# Patient Record
Sex: Male | Born: 1975 | Race: Black or African American | Hispanic: No | Marital: Married | Smoking: Former smoker
Health system: Southern US, Community
[De-identification: ages and names within clinical notes are randomized; demographics above are authoritative.]

## PROBLEM LIST (undated history)

## (undated) DIAGNOSIS — E119 Type 2 diabetes mellitus without complications: Secondary | ICD-10-CM

## (undated) DIAGNOSIS — E782 Mixed hyperlipidemia: Secondary | ICD-10-CM

## (undated) DIAGNOSIS — R479 Unspecified speech disturbances: Secondary | ICD-10-CM

## (undated) DIAGNOSIS — Z87891 Personal history of nicotine dependence: Secondary | ICD-10-CM

## (undated) DIAGNOSIS — K4020 Bilateral inguinal hernia, without obstruction or gangrene, not specified as recurrent: Secondary | ICD-10-CM

## (undated) DIAGNOSIS — K219 Gastro-esophageal reflux disease without esophagitis: Secondary | ICD-10-CM

## (undated) HISTORY — DX: Type 2 diabetes mellitus without complications: E11.9

## (undated) HISTORY — DX: Unspecified speech disturbances: R47.9

## (undated) HISTORY — PX: TONSILLECTOMY: SUR1361

---

## 2003-04-01 ENCOUNTER — Emergency Department (HOSPITAL_COMMUNITY): Admission: EM | Admit: 2003-04-01 | Discharge: 2003-04-01 | Payer: Self-pay | Admitting: Emergency Medicine

## 2009-03-27 ENCOUNTER — Emergency Department (HOSPITAL_COMMUNITY): Admission: EM | Admit: 2009-03-27 | Discharge: 2009-03-27 | Payer: Self-pay | Admitting: Emergency Medicine

## 2010-08-22 LAB — COMPREHENSIVE METABOLIC PANEL
ALT: 34 U/L (ref 0–53)
AST: 24 U/L (ref 0–37)
Albumin: 4.1 g/dL (ref 3.5–5.2)
Alkaline Phosphatase: 65 U/L (ref 39–117)
BUN: 19 mg/dL (ref 6–23)
CO2: 28 mEq/L (ref 19–32)
Calcium: 9.8 mg/dL (ref 8.4–10.5)
Chloride: 106 mEq/L (ref 96–112)
Creatinine, Ser: 1.4 mg/dL (ref 0.4–1.5)
GFR calc Af Amer: >60 mL/min (ref >60)
GFR calc non Af Amer: 59 mL/min — ABNORMAL LOW (ref >60)
Glucose, Bld: 118 mg/dL — ABNORMAL HIGH (ref 70–99)
Potassium: 3.7 mEq/L (ref 3.5–5.1)
Sodium: 141 mEq/L (ref 135–145)
Total Bilirubin: 0.5 mg/dL (ref 0.3–1.2)
Total Protein: 6.3 g/dL (ref 6.0–8.3)

## 2010-08-22 LAB — CBC
HCT: 43.5 % (ref 39.0–52.0)
Hemoglobin: 14.9 g/dL (ref 13.0–17.0)
MCHC: 34.3 g/dL (ref 30.0–36.0)
MCV: 92.3 fL (ref 78.0–100.0)
Platelets: 150 10*3/uL (ref 150–400)
RBC: 4.72 MIL/uL (ref 4.22–5.81)
RDW: 12.8 % (ref 11.5–15.5)
WBC: 6.5 10*3/uL (ref 4.0–10.5)

## 2010-08-22 LAB — URINE MICROSCOPIC-ADD ON

## 2010-08-22 LAB — LIPASE, BLOOD: Lipase: 46 U/L (ref 11–59)

## 2010-08-22 LAB — URINALYSIS, ROUTINE W REFLEX MICROSCOPIC
Bilirubin Urine: NEGATIVE
Glucose, UA: NEGATIVE mg/dL
Ketones, ur: NEGATIVE mg/dL
Nitrite: NEGATIVE
Protein, ur: NEGATIVE mg/dL
Specific Gravity, Urine: 1.028 (ref 1.005–1.030)
Urobilinogen, UA: 1 mg/dL (ref 0.0–1.0)
pH: 6 (ref 5.0–8.0)

## 2010-08-22 LAB — DIFFERENTIAL
Basophils Absolute: 0 10*3/uL (ref 0.0–0.1)
Basophils Relative: 0 % (ref 0–1)
Eosinophils Absolute: 0.1 10*3/uL (ref 0.0–0.7)
Eosinophils Relative: 2 % (ref 0–5)
Lymphocytes Relative: 21 % (ref 12–46)
Lymphs Abs: 1.4 10*3/uL (ref 0.7–4.0)
Monocytes Absolute: 0.7 10*3/uL (ref 0.1–1.0)
Monocytes Relative: 11 % (ref 3–12)
Neutro Abs: 4.3 10*3/uL (ref 1.7–7.7)
Neutrophils Relative %: 66 % (ref 43–77)

## 2010-09-05 ENCOUNTER — Inpatient Hospital Stay (INDEPENDENT_AMBULATORY_CARE_PROVIDER_SITE_OTHER)
Admission: RE | Admit: 2010-09-05 | Discharge: 2010-09-05 | Disposition: A | Discharge status: Home / Self Care | Payer: Self-pay | Source: Ambulatory Visit | Attending: Family Medicine | Admitting: Family Medicine

## 2010-09-05 DIAGNOSIS — L989 Disorder of the skin and subcutaneous tissue, unspecified: Secondary | ICD-10-CM

## 2010-09-05 DIAGNOSIS — J309 Allergic rhinitis, unspecified: Secondary | ICD-10-CM

## 2012-03-09 ENCOUNTER — Encounter (HOSPITAL_COMMUNITY): Payer: Self-pay | Admitting: *Deleted

## 2012-03-09 ENCOUNTER — Emergency Department (INDEPENDENT_AMBULATORY_CARE_PROVIDER_SITE_OTHER)
Admission: EM | Admit: 2012-03-09 | Discharge: 2012-03-09 | Disposition: A | Discharge status: Home / Self Care | Payer: Self-pay | Source: Home / Self Care | Attending: Family Medicine | Admitting: Family Medicine

## 2012-03-09 MED ORDER — DICLOFENAC POTASSIUM 50 MG PO TABS: 50.0000 mg | ORAL_TABLET | Freq: Three times a day (TID) | ORAL | Status: DC

## 2012-03-09 NOTE — ED Notes (Signed)
Pt reports right shoulder pain for 3 months on and off - no known injury

## 2012-03-09 NOTE — ED Provider Notes (Signed)
History     CSN: [id number]  Arrival date & time 03/09/12  1523   First MD Initiated Contact with Patient 03/09/12 1622      Chief Complaint  Patient presents with  . Shoulder Pain    (Consider location/radiation/quality/duration/timing/severity/associated sxs/prior treatment) Patient is a 36 y.o. male presenting with shoulder pain. The history is provided by the patient.  Shoulder Pain This is a new problem. The current episode started more than 1 week ago (8-12 weeks of pain from yoga exercise and work lifting.). The problem has not changed since onset.   History reviewed. No pertinent past medical history.  History reviewed. No pertinent past surgical history.  Family History  Problem Relation Age of Onset  . Family history unknown: Yes    History  Substance Use Topics  . Smoking status: Not on file  . Smokeless tobacco: Not on file  . Alcohol Use:       Review of Systems  Constitutional: Negative.   Gastrointestinal: Negative.   Genitourinary: Negative.   Musculoskeletal: Positive for myalgias. Negative for joint swelling and gait problem.  Skin: Negative.     Allergies  Review of patient's allergies indicates no known allergies.  Home Medications   Current Outpatient Rx  Name Route Sig Dispense Refill  . DICLOFENAC POTASSIUM 50 MG PO TABS Oral Take 1 tablet (50 mg total) by mouth 3 (three) times daily. 30 tablet 0    BP 136/75  Pulse 70  Temp 98.9 F (37.2 C) (Oral)  Resp 16  SpO2 100%  Physical Exam  Nursing note and vitals reviewed. Constitutional: He is oriented to person, place, and time. He appears well-developed and well-nourished.  Musculoskeletal: He exhibits tenderness.       Left shoulder: He exhibits tenderness. He exhibits normal range of motion, no bony tenderness and no swelling.       Arms: Neurological: He is alert and oriented to person, place, and time.  Skin: Skin is warm and dry.    ED Course  Procedures (including  critical care time)  Labs Reviewed - No data to display No results found.   1. Tendonitis of shoulder, left       MDM          Linna Hoff, MD 03/09/12 1825

## 2013-02-16 ENCOUNTER — Ambulatory Visit: Payer: PRIVATE HEALTH INSURANCE

## 2013-05-07 ENCOUNTER — Ambulatory Visit (INDEPENDENT_AMBULATORY_CARE_PROVIDER_SITE_OTHER): Payer: PRIVATE HEALTH INSURANCE | Admitting: Emergency Medicine

## 2013-05-07 VITALS — BP 120/80 | HR 71 | Temp 97.7°F | Resp 16 | Ht 71.5 in | Wt 170.0 lb

## 2013-05-07 DIAGNOSIS — R259 Unspecified abnormal involuntary movements: Secondary | ICD-10-CM

## 2013-05-07 DIAGNOSIS — R251 Tremor, unspecified: Secondary | ICD-10-CM

## 2013-05-07 LAB — POCT CBC
Granulocyte percent: 66.1 %G (ref 37–80)
HCT, POC: 50.6 % (ref 43.5–53.7)
Hemoglobin: 15.5 g/dL (ref 14.1–18.1)
Lymph, poc: 2 (ref 0.6–3.4)
MCH, POC: 30.9 pg (ref 27–31.2)
MCHC: 30.6 g/dL — AB (ref 31.8–35.4)
MCV: 101 fL — AB (ref 80–97)
MID (cbc): 0.5 (ref 0–0.9)
MPV: 8.9 fL (ref 0–99.8)
POC Granulocyte: 4.8 (ref 2–6.9)
POC LYMPH PERCENT: 27 %L (ref 10–50)
POC MID %: 6.9 %M (ref 0–12)
Platelet Count, POC: 175 10*3/uL (ref 142–424)
RBC: 5.01 M/uL (ref 4.69–6.13)
RDW, POC: 12.8 %
WBC: 7.3 10*3/uL (ref 4.6–10.2)

## 2013-05-07 NOTE — Progress Notes (Signed)
   Subjective:    Patient ID: Brett [name], male    DOB: [birth year], 37 y.o.   MRN: [id number]  HPI  Patient presents today with anxiety. He states he has been nervous and anxious all his life. He works in Systems developer at [employer]. He went to college but he had to quit College because he has a hard time getting transportation. He gets the shakes real bad. It makes it hard for him to eat or drink because he has tremmors real bad. He has never been checkout for this problem.     Review of Systems     Objective:   Physical Exam patient is alert and cooperative. He does have a somewhat slow speech. He is oriented. Pupils are equal and reactive to light. His neck is supple. His chest is clear to both auscultation and percussion. Heart is regular rate without murmurs rubs or gallops. The abdomen is soft liver spleen not enlarged no tenderness. Neurologically cranial nerves II through XII are intact. Motor strength is 5 out of 5 almost groups. There are no focal neurological signs to Results for orders placed in visit on 05/07/13  POCT CBC      Result Value Range   WBC 7.3  4.6 - 10.2 K/uL   Lymph, poc 2.0  0.6 - 3.4   POC LYMPH PERCENT 27.0  10 - 50 %L   MID (cbc) 0.5  0 - 0.9   POC MID % 6.9  0 - 12 %M   POC Granulocyte 4.8  2 - 6.9   Granulocyte percent 66.1  37 - 80 %G   RBC 5.01  4.69 - 6.13 M/uL   Hemoglobin 15.5  14.1 - 18.1 g/dL   HCT, POC 78.4  69.6 - 53.7 %   MCV 101.0 (*) 80 - 97 fL   MCH, POC 30.9  27 - 31.2 pg   MCHC 30.6 (*) 31.8 - 35.4 g/dL   RDW, POC 29.5     Platelet Count, POC 175  142 - 424 K/uL   MPV 8.9  0 - 99.8 fL   Patient has a very fine tremor with intention but it is minimal.      Assessment & Plan:  Routine labs were done today. We'll schedule for CT of the head and neuro referral to evaluate for tremor. This is an intention tremor which is not present at rest .

## 2013-05-07 NOTE — Patient Instructions (Signed)
We are going to schedule you for a CT of the head and an appointment to see the neurologist to see if we can help you with your tremor

## 2013-05-08 LAB — COMPREHENSIVE METABOLIC PANEL
ALT: 16 U/L (ref 0–53)
AST: 15 U/L (ref 0–37)
Albumin: 3.9 g/dL (ref 3.5–5.2)
Alkaline Phosphatase: 43 U/L (ref 39–117)
BUN: 16 mg/dL (ref 6–23)
CO2: 28 mEq/L (ref 19–32)
Calcium: 8.9 mg/dL (ref 8.4–10.5)
Chloride: 106 mEq/L (ref 96–112)
Creat: 1.18 mg/dL (ref 0.50–1.35)
Glucose, Bld: 94 mg/dL (ref 70–99)
Potassium: 4.2 mEq/L (ref 3.5–5.3)
Sodium: 140 mEq/L (ref 135–145)
Total Bilirubin: 0.4 mg/dL (ref 0.3–1.2)
Total Protein: 5.7 g/dL — ABNORMAL LOW (ref 6.0–8.3)

## 2013-05-08 LAB — T4, FREE: Free T4: 1.29 ng/dL (ref 0.80–1.80)

## 2013-05-08 LAB — TSH: TSH: 0.382 u[IU]/mL (ref 0.350–4.500)

## 2013-05-27 ENCOUNTER — Ambulatory Visit: Payer: PRIVATE HEALTH INSURANCE | Admitting: Neurology

## 2013-05-28 ENCOUNTER — Other Ambulatory Visit: Payer: Self-pay

## 2014-09-12 ENCOUNTER — Emergency Department (HOSPITAL_COMMUNITY)
Admission: EM | Admit: 2014-09-12 | Discharge: 2014-09-12 | Disposition: A | Discharge status: Home / Self Care | Payer: Self-pay | Attending: Emergency Medicine | Admitting: Emergency Medicine

## 2014-09-12 ENCOUNTER — Encounter (HOSPITAL_COMMUNITY): Payer: Self-pay | Admitting: Family Medicine

## 2014-09-12 ENCOUNTER — Emergency Department (HOSPITAL_COMMUNITY): Payer: Self-pay

## 2014-09-12 DIAGNOSIS — J159 Unspecified bacterial pneumonia: Secondary | ICD-10-CM | POA: Insufficient documentation

## 2014-09-12 DIAGNOSIS — R059 Cough, unspecified: Secondary | ICD-10-CM

## 2014-09-12 DIAGNOSIS — R05 Cough: Secondary | ICD-10-CM

## 2014-09-12 DIAGNOSIS — J189 Pneumonia, unspecified organism: Secondary | ICD-10-CM

## 2014-09-12 MED ORDER — AZITHROMYCIN 250 MG PO TABS: 250.0000 mg | ORAL_TABLET | Freq: Every day | ORAL | Status: DC

## 2014-09-12 NOTE — ED Provider Notes (Signed)
CSN: [id number]     Arrival date & time 09/12/14  1315 History  This chart was scribed for non-physician practitioner, Sharilyn SitesLisa Carreen Milius, PA-C working with Tilden FossaElizabeth Rees, MD, by Jarvis Morganaylor Ferguson, ED Scribe. This patient was seen in room TR06C/TR06C and the patient's care was started at 2:04 PM.    Chief Complaint  Patient presents with  . Cough    The history is provided by the patient. No language interpreter was used.    HPI Comments: Brett [name] is a 39 y.o. male who presents to the Emergency Department complaining of intermittent, gradually worsening, dry cough for 10 days. He states he is having associated chills, generalized myalgias, and nasal congestion. Pt has been taking Mucinex with no relief. He denies any sick contacts. Pt denies any h/o asthma. He denies any fever, nausea, vomiting, chest pain or SOB.    Past Medical History  Diagnosis Date  . Speech impediment    Past Surgical History  Procedure Laterality Date  . Tonsillectomy     Family History  Problem Relation Age of Onset  . Healthy Mother   . Pneumonia Father   . Pneumonia Sister   . Pneumonia Brother   . Diabetes Maternal Grandmother   . Pneumonia Sister    History  Substance Use Topics  . Smoking status: Never Smoker   . Smokeless tobacco: Not on file  . Alcohol Use: Not on file    Review of Systems  Constitutional: Positive for chills. Negative for fever.  Respiratory: Positive for cough. Negative for shortness of breath.   Cardiovascular: Negative for chest pain.  Gastrointestinal: Negative for nausea and vomiting.  Musculoskeletal: Negative for myalgias.      Allergies  Review of patient's allergies indicates no known allergies.  Home Medications   Prior to Admission medications   Not on File   Triage Vitals: BP 121/70 mmHg  Pulse 79  Temp(Src) 98.8 F (37.1 C) (Oral)  Resp 22  SpO2 94%  Physical Exam  Constitutional: He is oriented to person, place, and time. He appears  well-developed and well-nourished.  HENT:  Head: Normocephalic and atraumatic.  Right Ear: Tympanic membrane and ear canal normal.  Left Ear: Tympanic membrane and ear canal normal.  Nose: Nose normal.  Mouth/Throat: Uvula is midline, oropharynx is clear and moist and mucous membranes are normal.  Eyes: Conjunctivae and EOM are normal. Pupils are equal, round, and reactive to light.  Neck: Normal range of motion.  Cardiovascular: Normal rate, regular rhythm and normal heart sounds.   Pulmonary/Chest: Effort normal. No respiratory distress. He has rhonchi.  Rhonchi and left upper lobe, lungs otherwise clear; no distress  Abdominal: Soft. Bowel sounds are normal.  Musculoskeletal: Normal range of motion.  Neurological: He is alert and oriented to person, place, and time.  Skin: Skin is warm and dry.  Psychiatric: He has a normal mood and affect.  Nursing note and vitals reviewed.   ED Course  Procedures (including critical care time)  DIAGNOSTIC STUDIES: Oxygen Saturation is 94% on RA, adequate by my interpretation.    COORDINATION OF CARE: 2:07 PM- Will order CXR. Pt advised of plan for treatment and pt agrees.     Labs Review Labs Reviewed - No data to display  Imaging Review Dg Chest 2 View  09/12/2014   CLINICAL DATA:  Cough with chest congestion and chest pain 1 week. Headache and dizziness.  EXAM: CHEST  2 VIEW  COMPARISON:  None.  FINDINGS: Lungs are adequately inflated with  patchy airspace process over the left lung likely pneumonia. No evidence of effusion. Cardiomediastinal silhouette is within normal. There is mild curvature of the thoracic spine convex right.  IMPRESSION: Patchy airspace process over the left lung likely a pneumonia.   Electronically Signed   By: Elberta Fortis M.D.   On: 09/12/2014 15:13     EKG Interpretation None      MDM   Final diagnoses:  CAP (community acquired pneumonia)   39 year old male here with cough over the past week. No  relief with Mucinex. Patient is afebrile and nontoxic. He does have some rhonchi in his left upper fields. Chest x-ray was obtained confirming likely left sided pneumonia. Patient started on azithromycin. Will discharge home with supportive care.  Discussed plan with patient, he/she acknowledged understanding and agreed with plan of care.  Return precautions given for new or worsening symptoms.  I personally performed the services described in this documentation, which was scribed in my presence. The recorded information has been reviewed and is accurate.  Garlon Hatchet, PA-C 09/12/14 1537  Tilden Fossa, MD 09/12/14 [phone]

## 2014-09-12 NOTE — ED Notes (Signed)
p here for cough, congestion. Taking mucinex.

## 2014-09-12 NOTE — Discharge Instructions (Signed)
Take the prescribed medication as directed. Return to the ED for new or worsening symptoms-- worsening cough, high fever, chest pain, shortness of breath, etc.

## 2014-09-12 NOTE — ED Notes (Signed)
Called xray and they advised they have one in front of this patient and then they will come and get.

## 2017-05-20 DIAGNOSIS — Z8701 Personal history of pneumonia (recurrent): Secondary | ICD-10-CM

## 2017-05-20 HISTORY — DX: Personal history of pneumonia (recurrent): Z87.01

## 2017-08-11 DIAGNOSIS — S63509A Unspecified sprain of unspecified wrist, initial encounter: Secondary | ICD-10-CM | POA: Insufficient documentation

## 2018-01-20 ENCOUNTER — Encounter: Payer: Self-pay | Admitting: Emergency Medicine

## 2018-01-20 ENCOUNTER — Emergency Department
Admission: EM | Admit: 2018-01-20 | Discharge: 2018-01-20 | Disposition: A | Discharge status: Home / Self Care | Payer: BLUE CROSS/BLUE SHIELD | Attending: Emergency Medicine | Admitting: Emergency Medicine

## 2018-01-20 DIAGNOSIS — A084 Viral intestinal infection, unspecified: Secondary | ICD-10-CM

## 2018-01-20 DIAGNOSIS — B9789 Other viral agents as the cause of diseases classified elsewhere: Secondary | ICD-10-CM | POA: Diagnosis not present

## 2018-01-20 DIAGNOSIS — R479 Unspecified speech disturbances: Secondary | ICD-10-CM | POA: Diagnosis not present

## 2018-01-20 DIAGNOSIS — R112 Nausea with vomiting, unspecified: Secondary | ICD-10-CM | POA: Diagnosis present

## 2018-01-20 DIAGNOSIS — K529 Noninfective gastroenteritis and colitis, unspecified: Secondary | ICD-10-CM | POA: Diagnosis not present

## 2018-01-20 LAB — LIPASE, BLOOD: Lipase: 32 U/L (ref 11–51)

## 2018-01-20 LAB — URINALYSIS, COMPLETE (UACMP) WITH MICROSCOPIC
Bacteria, UA: NONE SEEN
Bilirubin Urine: NEGATIVE
Glucose, UA: NEGATIVE mg/dL
Hgb urine dipstick: NEGATIVE
Ketones, ur: NEGATIVE mg/dL
Leukocytes, UA: NEGATIVE
Nitrite: NEGATIVE
Protein, ur: NEGATIVE mg/dL
Specific Gravity, Urine: 1.023 (ref 1.005–1.030)
pH: 5 (ref 5.0–8.0)

## 2018-01-20 LAB — COMPREHENSIVE METABOLIC PANEL
ALT: 35 U/L (ref 0–44)
AST: 24 U/L (ref 15–41)
Albumin: 4.2 g/dL (ref 3.5–5.0)
Alkaline Phosphatase: 57 U/L (ref 38–126)
Anion gap: 7 (ref 5–15)
BUN: 15 mg/dL (ref 6–20)
CO2: 27 mmol/L (ref 22–32)
Calcium: 8.7 mg/dL — ABNORMAL LOW (ref 8.9–10.3)
Chloride: 107 mmol/L (ref 98–111)
Creatinine, Ser: 1.27 mg/dL — ABNORMAL HIGH (ref 0.61–1.24)
GFR calc Af Amer: >60 mL/min (ref >60)
GFR calc non Af Amer: >60 mL/min (ref >60)
Glucose, Bld: 102 mg/dL — ABNORMAL HIGH (ref 70–99)
Potassium: 3.9 mmol/L (ref 3.5–5.1)
Sodium: 141 mmol/L (ref 135–145)
Total Bilirubin: 1 mg/dL (ref 0.3–1.2)
Total Protein: 6.7 g/dL (ref 6.5–8.1)

## 2018-01-20 LAB — CBC
HCT: 47.2 % (ref 40.0–52.0)
Hemoglobin: 16 g/dL (ref 13.0–18.0)
MCH: 30.9 pg (ref 26.0–34.0)
MCHC: 33.9 g/dL (ref 32.0–36.0)
MCV: 91.2 fL (ref 80.0–100.0)
Platelets: 144 10*3/uL — ABNORMAL LOW (ref 150–440)
RBC: 5.17 MIL/uL (ref 4.40–5.90)
RDW: 13.6 % (ref 11.5–14.5)
WBC: 7.3 10*3/uL (ref 3.8–10.6)

## 2018-01-20 MED ORDER — ONDANSETRON 4 MG PO TBDP
8.0000 mg | ORAL_TABLET | Freq: Once | ORAL | Status: AC
  Administered 2018-01-20: 8 mg via ORAL
  Filled 2018-01-20: qty 2

## 2018-01-20 MED ORDER — ONDANSETRON 4 MG PO TBDP: 4.0000 mg | ORAL_TABLET | Freq: Three times a day (TID) | ORAL | 0 refills | Status: DC | PRN

## 2018-01-20 NOTE — ED Triage Notes (Signed)
Pt reports has a lot of issues with his abdomen and has for awhile. Pt reports last pm started with abdominal pain, headache and body aches. Pt also reports dark green diarrhea. Pt denies vomiting, reports does feel nauseated.

## 2018-01-20 NOTE — ED Provider Notes (Signed)
[facility] Emergency Department Provider Note   ____________________________________________    I have reviewed the triage vital signs and the nursing notes.   HISTORY  Chief Complaint Nausea and vomiting    HPI Brett [name] is a 42 y.o. male who reports that yesterday evening he began developing nausea followed by vomiting as well as some upper abdominal discomfort.  He assumed this was related to drinking "a couple of beers ".  This morning he was feeling better and was on his way to work when he developed nausea again.  His abdominal pain is somewhat better.  Denies diarrhea.  No fevers or chills.  No sick contacts.  No recent travel  Past Medical History:  Diagnosis Date  . Speech impediment     Patient Active Problem List   Diagnosis Date Noted  . Tremor 05/07/2013    Past Surgical History:  Procedure Laterality Date  . TONSILLECTOMY      Prior to Admission medications   Medication Sig Start Date End Date Taking? Authorizing Provider  azithromycin (ZITHROMAX) 250 MG tablet Take 1 tablet (250 mg total) by mouth daily. Take first 2 tablets together, then 1 every day until finished. Patient not taking: Reported on 01/20/2018 09/12/14   Garlon Hatchet, PA-C  ondansetron (ZOFRAN ODT) 4 MG disintegrating tablet Take 1 tablet (4 mg total) by mouth every 8 (eight) hours as needed for nausea or vomiting. 01/20/18   Jene Every, MD     Allergies Patient has no known allergies.  Family History  Problem Relation Age of Onset  . Healthy Mother   . Pneumonia Father   . Pneumonia Sister   . Pneumonia Brother   . Diabetes Maternal Grandmother   . Pneumonia Sister     Social History Social History   Tobacco Use  . Smoking status: Never Smoker  Substance Use Topics  . Alcohol use: Not on file  . Drug use: Not on file    Review of Systems  Constitutional: No fever/chills Eyes: No visual changes.  ENT: No sore throat. Cardiovascular:  Denies chest pain. Respiratory: Denies shortness of breath. Gastrointestinal: As above Genitourinary: Negative for dysuria. Musculoskeletal: Negative for back pain. Skin: Negative for rash. Neurological: Negative for headaches    ____________________________________________   PHYSICAL EXAM:  VITAL SIGNS: ED Triage Vitals  Enc Vitals Group     BP 01/20/18 0905 136/79     Pulse Rate 01/20/18 0905 88     Resp 01/20/18 0905 20     Temp 01/20/18 0907 98.2 F (36.8 C)     Temp Source 01/20/18 0907 Oral     SpO2 01/20/18 0905 95 %     Weight 01/20/18 0906 95.3 kg (210 lb)     Height 01/20/18 0906 1.829 m (6')     Head Circumference --      Peak Flow --      Pain Score 01/20/18 0906 8     Pain Loc --      Pain Edu? --      Excl. in GC? --     Constitutional: Alert and oriented. No acute distress. Pleasant and interactive  Nose: No congestion/rhinnorhea. Mouth/Throat: Mucous membranes are moist.    Cardiovascular: Normal rate, regular rhythm.Good peripheral circulation. Respiratory: Normal respiratory effort.  No retractions. Lungs CTAB. Gastrointestinal: Soft and nontender. No distention.   Genitourinary: deferred Musculoskeletal: No lower extremity tenderness nor edema.  Warm and well perfused Neurologic:  Normal speech and language. No  gross focal neurologic deficits are appreciated.  Skin:  Skin is warm, dry and intact. No rash noted. Psychiatric: Mood and affect are normal. Speech and behavior are normal.  ____________________________________________   LABS (all labs ordered are listed, but only abnormal results are displayed)  Labs Reviewed  COMPREHENSIVE METABOLIC PANEL - Abnormal; Notable for the following components:      Result Value   Glucose, Bld 102 (*)    Creatinine, Ser 1.27 (*)    Calcium 8.7 (*)    All other components within normal limits  CBC - Abnormal; Notable for the following components:   Platelets 144 (*)    All other components within  normal limits  URINALYSIS, COMPLETE (UACMP) WITH MICROSCOPIC - Abnormal; Notable for the following components:   Color, Urine YELLOW (*)    APPearance CLEAR (*)    All other components within normal limits  LIPASE, BLOOD   ____________________________________________  EKG  ED ECG REPORT I, Jene Every, the attending physician, personally viewed and interpreted this ECG.  Date: 01/20/2018  Rhythm: normal sinus rhythm QRS Axis: normal Intervals: Incomplete right bundle branch block ST/T Wave abnormalities: normal Narrative Interpretation: no evidence of acute ischemia  ____________________________________________  RADIOLOGY  None ____________________________________________   PROCEDURES  Procedure(s) performed: No  Procedures   Critical Care performed: No ____________________________________________   INITIAL IMPRESSION / ASSESSMENT AND PLAN / ED COURSE  Pertinent labs & imaging results that were available during my care of the patient were reviewed by me and considered in my medical decision making (see chart for details).  Patient quite well-appearing in no acute distress.  Vital signs are unremarkable.  Abdominal exam is benign.  Treated with p.o. ODT Zofran with significant improvement in nausea.  On reexam abdomen remains soft without pain.  Discussed the patient mildly low platelets and the need for outpatient follow-up.  Otherwise lab work is quite reassuring.  Appropriate for conservative care, Zofran prescription provided    ____________________________________________   FINAL CLINICAL IMPRESSION(S) / ED DIAGNOSES  Final diagnoses:  Viral gastroenteritis        Note:  This document was prepared using Dragon voice recognition software and may include unintentional dictation errors.    Jene Every, MD 01/20/18 [phone]

## 2018-01-21 ENCOUNTER — Other Ambulatory Visit: Payer: Self-pay

## 2018-01-21 ENCOUNTER — Ambulatory Visit
Admission: EM | Admit: 2018-01-21 | Discharge: 2018-01-21 | Disposition: A | Discharge status: Home / Self Care | Payer: BLUE CROSS/BLUE SHIELD | Attending: Family Medicine | Admitting: Family Medicine

## 2018-01-21 ENCOUNTER — Encounter: Payer: Self-pay | Admitting: Emergency Medicine

## 2018-01-21 DIAGNOSIS — A084 Viral intestinal infection, unspecified: Secondary | ICD-10-CM | POA: Diagnosis not present

## 2018-01-21 DIAGNOSIS — R51 Headache: Secondary | ICD-10-CM | POA: Diagnosis not present

## 2018-01-21 MED ORDER — KETOROLAC TROMETHAMINE 60 MG/2ML IM SOLN
60.0000 mg | Freq: Once | INTRAMUSCULAR | Status: AC
Start: 2018-01-21 — End: 2018-01-21
  Administered 2018-01-21: 60 mg via INTRAMUSCULAR

## 2018-01-21 MED ORDER — DIPHENOXYLATE-ATROPINE 2.5-0.025 MG PO TABS: 1.0000 | ORAL_TABLET | Freq: Four times a day (QID) | ORAL | 0 refills | Status: DC | PRN

## 2018-01-21 NOTE — ED Triage Notes (Signed)
Patient was seen at [facility] yesterday for abd pain, diarrhea and headache. Patient states he came here because his symptoms have not improved. Patient has been taking the Zofran that was prescribed for his nausea.

## 2018-01-21 NOTE — Discharge Instructions (Addendum)
This appears to be viral.  Lots of fluids. Gatorade, powerade or pedialyte.   Use the lomotil for diarrhea if it is severe.  No need/indication for CT scan at this time.  Take care  Dr. Adriana Simas

## 2018-01-21 NOTE — ED Provider Notes (Signed)
[facility]    CSN: [id number] Arrival date & time: 01/21/18  0820   History   Chief Complaint Headache, diarrhea  HPI  42 year old male presents with the above complaints.  Patient states that he has had ongoing nausea, diarrhea, and headache.  Started on Tuesday.  Patient was seen in the ER yesterday.  Per the note, he was reporting nausea, vomiting, and some upper abdominal pain.  Patient states that he has had no vomiting.  He states that his biggest complaints currently are headache and diarrhea.  He states that he has some intermittent periumbilical abdominal pain.  None currently.  Headache is his primary concern at this point in time.  Moderate in severity.  Patient states that he has been able to tolerate water.  However, foods give him diarrhea.  No fever.  No known inciting factor.  No known exacerbating factors.  No other associated symptoms.  No other complaints.  PMH, Surgical Hx, Family Hx, Social History reviewed and updated as below.  Past Medical History:  Diagnosis Date  . Speech impediment    Patient Active Problem List   Diagnosis Date Noted  . Tremor 05/07/2013   Past Surgical History:  Procedure Laterality Date  . TONSILLECTOMY     Family History Family History  Problem Relation Age of Onset  . Healthy Mother   . Pneumonia Father   . Pneumonia Sister   . Pneumonia Brother   . Diabetes Maternal Grandmother   . Pneumonia Sister     Social History Social History   Tobacco Use  . Smoking status: Never Smoker  . Smokeless tobacco: Never Used  Substance Use Topics  . Alcohol use: Yes    Comment: occasionally   . Drug use: Never     Allergies   Patient has no known allergies.   Review of Systems Review of Systems  Constitutional: Negative for fever.  Gastrointestinal: Positive for abdominal pain, diarrhea and nausea.   Physical Exam Triage Vital Signs ED Triage Vitals [01/21/18 0844]  Enc Vitals Group     BP 121/89   Pulse Rate 77     Resp 18     Temp 98.3 F (36.8 C)     Temp Source Oral     SpO2 97 %     Weight 211 lb (95.7 kg)     Height 6' (1.829 m)     Head Circumference      Peak Flow      Pain Score 7     Pain Loc      Pain Edu?      Excl. in GC?    Updated Vital Signs BP 121/89 (BP Location: Left Arm)   Pulse 77   Temp 98.3 F (36.8 C) (Oral)   Resp 18   Ht 6' (1.829 m)   Wt 95.7 kg   SpO2 97%   BMI 28.62 kg/m   Visual Acuity Right Eye Distance:   Left Eye Distance:   Bilateral Distance:    Right Eye Near:   Left Eye Near:    Bilateral Near:     Physical Exam  Constitutional: He is oriented to person, place, and time. He appears well-developed. No distress.  HENT:  Head: Normocephalic and atraumatic.  Mouth/Throat: Oropharynx is clear and moist.  Cardiovascular: Normal rate and regular rhythm.  Pulmonary/Chest: Effort normal and breath sounds normal. He has no wheezes. He has no rales.  Neurological: He is alert and oriented to person, place, and time.  Answers questions appropriately.  Psychiatric: He has a normal mood and affect. His behavior is normal.  Nursing note and vitals reviewed.  UC Treatments / Results  Labs (all labs ordered are listed, but only abnormal results are displayed) Labs Reviewed - No data to display  EKG None  Radiology No results found.  Procedures Procedures (including critical care time)  Medications Ordered in UC Medications  ketorolac (TORADOL) injection 60 mg (60 mg Intramuscular Given 01/21/18 0904)    Initial Impression / Assessment and Plan / UC Course  I have reviewed the triage vital signs and the nursing notes.  Pertinent labs & imaging results that were available during my care of the patient were reviewed by me and considered in my medical decision making (see chart for details).    42 year old male presents with a viral process.  Toradol given for headache.  Lomotil for diarrhea.  Advised hydration with  Gatorade/Powerade/Pedialyte.  Supportive care.  Final Clinical Impressions(s) / UC Diagnoses   Final diagnoses:  Viral gastroenteritis     Discharge Instructions     This appears to be viral.  Lots of fluids. Gatorade, powerade or pedialyte.   Use the lomotil for diarrhea if it is severe.  No need/indication for CT scan at this time.  Take care  Dr. Adriana Simas      ED Prescriptions    Medication Sig Dispense Auth. Provider   diphenoxylate-atropine (LOMOTIL) 2.5-0.025 MG tablet Take 1 tablet by mouth 4 (four) times daily as needed for diarrhea or loose stools. 30 tablet Tommie Sams, DO     Controlled Substance Prescriptions  Controlled Substance Registry consulted? Not Applicable   Tommie Sams, [place] 01/21/18 1610

## 2018-01-22 ENCOUNTER — Other Ambulatory Visit: Payer: Self-pay

## 2018-01-22 ENCOUNTER — Emergency Department: Payer: BLUE CROSS/BLUE SHIELD

## 2018-01-22 ENCOUNTER — Emergency Department
Admission: EM | Admit: 2018-01-22 | Discharge: 2018-01-22 | Disposition: A | Discharge status: Home / Self Care | Payer: BLUE CROSS/BLUE SHIELD | Attending: Emergency Medicine | Admitting: Emergency Medicine

## 2018-01-22 ENCOUNTER — Encounter: Payer: Self-pay | Admitting: *Deleted

## 2018-01-22 DIAGNOSIS — R52 Pain, unspecified: Secondary | ICD-10-CM

## 2018-01-22 DIAGNOSIS — R079 Chest pain, unspecified: Secondary | ICD-10-CM | POA: Insufficient documentation

## 2018-01-22 DIAGNOSIS — K2100 Gastro-esophageal reflux disease with esophagitis, without bleeding: Secondary | ICD-10-CM

## 2018-01-22 LAB — CBC
HCT: 44.5 % (ref 40.0–52.0)
Hemoglobin: 14.9 g/dL (ref 13.0–18.0)
MCH: 30.6 pg (ref 26.0–34.0)
MCHC: 33.6 g/dL (ref 32.0–36.0)
MCV: 91.2 fL (ref 80.0–100.0)
PLATELETS: 151 10*3/uL (ref 150–440)
RBC: 4.88 MIL/uL (ref 4.40–5.90)
RDW: 13.4 % (ref 11.5–14.5)
WBC: 4.7 10*3/uL (ref 3.8–10.6)

## 2018-01-22 LAB — BASIC METABOLIC PANEL
ANION GAP: 6 (ref 5–15)
BUN: 16 mg/dL (ref 6–20)
CALCIUM: 8.5 mg/dL — AB (ref 8.9–10.3)
CO2: 26 mmol/L (ref 22–32)
CREATININE: 1.18 mg/dL (ref 0.61–1.24)
Chloride: 107 mmol/L (ref 98–111)
Glucose, Bld: 118 mg/dL — ABNORMAL HIGH (ref 70–99)
Potassium: 3.7 mmol/L (ref 3.5–5.1)
SODIUM: 139 mmol/L (ref 135–145)

## 2018-01-22 LAB — TROPONIN I: Troponin I: <0.03 ng/mL (ref <0.03)

## 2018-01-22 LAB — FIBRIN DERIVATIVES D-DIMER (ARMC ONLY): FIBRIN DERIVATIVES D-DIMER (ARMC): 236.13 ng{FEU}/mL (ref 0.00–499.00)

## 2018-01-22 MED ORDER — ESOMEPRAZOLE MAGNESIUM 40 MG PO PACK: 40.0000 mg | PACK | Freq: Every day | ORAL | 0 refills | Status: DC

## 2018-01-22 MED ORDER — GI COCKTAIL ~~LOC~~
30.0000 mL | Freq: Once | ORAL | Status: AC
  Administered 2018-01-22: 30 mL via ORAL
  Filled 2018-01-22: qty 30

## 2018-01-22 NOTE — ED Notes (Signed)
Patient transported to X-ray 

## 2018-01-22 NOTE — ED Triage Notes (Signed)
Pt arrives with c/o chest pain, says he is having "a bad pressure in my chest like my chest is caving in". He says it does feel somewhat like his acid reflux, he has Protonix rx for the same at home.

## 2018-01-22 NOTE — ED Provider Notes (Signed)
[facility] Emergency Department Provider Note   ____________________________________________   First MD Initiated Contact with Patient 01/22/18 [phone]     (approximate)  I have reviewed the triage vital signs and the nursing notes.   HISTORY  Chief Complaint Chest Pain    HPI Brett [name] is a 42 y.o. male who presents to the ED from home with a chief complaint of chest pain.  Patient reports a 1 day history of lower chest/upper epigastric pain.  He was seen in the ED on 9/3 and at urgent care the following day for upper abdominal pain, vomiting and diarrhea thought to be viral gastroenteritis.  Occurred after a night of drinking alcohol.  States symptoms of vomiting and diarrhea have improved.  Complains of constant, nonradiating burning-like sensation in his lower chest/epigastrium.  Symptoms not associated with diaphoresis, shortness of breath, nausea/vomiting, palpitations or dizziness.  Notes Protonix has been breaking his hands out.  Denies difficulty breathing or tongue swelling.  Denies recent fever, chills, dysuria.  Denies recent travel or trauma.   Past Medical History:  Diagnosis Date  . Speech impediment     Patient Active Problem List   Diagnosis Date Noted  . Tremor 05/07/2013    Past Surgical History:  Procedure Laterality Date  . TONSILLECTOMY      Prior to Admission medications   Medication Sig Start Date End Date Taking? Authorizing Provider  diphenoxylate-atropine (LOMOTIL) 2.5-0.025 MG tablet Take 1 tablet by mouth 4 (four) times daily as needed for diarrhea or loose stools. 01/21/18   Tommie Sams, DO  esomeprazole (NEXIUM) 40 MG packet Take 40 mg by mouth daily before breakfast. 01/22/18   Irean Hong, MD  ondansetron (ZOFRAN ODT) 4 MG disintegrating tablet Take 1 tablet (4 mg total) by mouth every 8 (eight) hours as needed for nausea or vomiting. 01/20/18   Jene Every, MD    Allergies Patient has no known  allergies.  Family History  Problem Relation Age of Onset  . Healthy Mother   . Pneumonia Father   . Pneumonia Sister   . Pneumonia Brother   . Diabetes Maternal Grandmother   . Pneumonia Sister     Social History Social History   Tobacco Use  . Smoking status: Never Smoker  . Smokeless tobacco: Never Used  Substance Use Topics  . Alcohol use: Yes    Comment: occasionally   . Drug use: Never    Review of Systems  Constitutional: No fever/chills Eyes: No visual changes. ENT: No sore throat. Cardiovascular: Positive for chest pain. Respiratory: Denies shortness of breath. Gastrointestinal: No abdominal pain.  No nausea, no vomiting.  No diarrhea.  No constipation. Genitourinary: Negative for dysuria. Musculoskeletal: Negative for back pain. Skin: Negative for rash. Neurological: Negative for headaches, focal weakness or numbness.   ____________________________________________   PHYSICAL EXAM:  VITAL SIGNS: ED Triage Vitals  Enc Vitals Group     BP 01/22/18 0139 113/75     Pulse Rate 01/22/18 0139 81     Resp 01/22/18 0139 16     Temp 01/22/18 0139 98.8 F (37.1 C)     Temp Source 01/22/18 0139 Oral     SpO2 01/22/18 0139 96 %     Weight --      Height --      Head Circumference --      Peak Flow --      Pain Score 01/22/18 0146 8     Pain Loc --  Pain Edu? --      Excl. in GC? --     Constitutional: Alert and oriented. Well appearing and in no acute distress. Eyes: Conjunctivae are normal. PERRL. EOMI. Head: Atraumatic. Nose: No congestion/rhinnorhea. Mouth/Throat: Mucous membranes are moist.  Oropharynx non-erythematous. Neck: No stridor.   Cardiovascular: Normal rate, regular rhythm. Grossly normal heart sounds.  Good peripheral circulation. Respiratory: Normal respiratory effort.  No retractions. Lungs CTAB. Gastrointestinal: Soft and nontender to light or deep palpation. No distention. No abdominal bruits. No CVA  tenderness. Musculoskeletal: No lower extremity tenderness nor edema.  No joint effusions. Neurologic:  Normal speech and language. No gross focal neurologic deficits are appreciated. No gait instability. Skin:  Skin is warm, dry and intact. No rash noted. Psychiatric: Mood and affect are normal. Speech and behavior are normal.  ____________________________________________   LABS (all labs ordered are listed, but only abnormal results are displayed)  Labs Reviewed  BASIC METABOLIC PANEL - Abnormal; Notable for the following components:      Result Value   Glucose, Bld 118 (*)    Calcium 8.5 (*)    All other components within normal limits  CBC  TROPONIN I  FIBRIN DERIVATIVES D-DIMER ([facility] ONLY)  TROPONIN I   ____________________________________________  EKG  ED ECG REPORT I, SUNG,JADE J, the attending physician, personally viewed and interpreted this ECG.   Date: 01/22/2018  EKG Time: 0136  Rate: 90  Rhythm: normal EKG, normal sinus rhythm  Axis: Normal  Intervals:left posterior fascicular block  ST&T Change: Nonspecific No change from 01/20/2018 ____________________________________________  RADIOLOGY  ED MD interpretation: Cardiomegaly, otherwise no acute cardiopulmonary process  Official radiology report(s): Dg Chest 2 View  Result Date: 01/22/2018 CLINICAL DATA:  Chest pain with pressure. EXAM: CHEST - 2 VIEW COMPARISON:  None. FINDINGS: Mild cardiomegaly. No aortic aneurysm. Slightly low lung volumes with crowding of interstitial lung markings. No alveolar consolidation or CHF. No effusion or pneumothorax. No acute osseous abnormality. IMPRESSION: Cardiomegaly without active pulmonary disease. Slightly low lung volumes with crowding of interstitial lung markings noted. Electronically Signed   By: Tollie Eth M.D.   On: 01/22/2018 02:16    ____________________________________________   PROCEDURES  Procedure(s) performed: None  Procedures  Critical Care  performed: No  ____________________________________________   INITIAL IMPRESSION / ASSESSMENT AND PLAN / ED COURSE  As part of my medical decision making, I reviewed the following data within the electronic MEDICAL RECORD NUMBER Nursing notes reviewed and incorporated, Labs reviewed, EKG interpreted, Old EKG reviewed, Old chart reviewed, Radiograph reviewed and Notes from prior ED visits   42 year old otherwise healthy male who presents with chest pain in the setting of recent vomiting and diarrhea. Differential diagnosis includes, but is not limited to, ACS, aortic dissection, pulmonary embolism, cardiac tamponade, pneumothorax, pneumonia, pericarditis, myocarditis, GI-related causes including esophagitis/gastritis, and musculoskeletal chest wall pain.    Initial lab work including troponin and d-dimer, EKG and chest x-ray unremarkable.  Will repeat timed troponin.  Administer GI cocktail and reassess.  Clinical Course as of Jan 22 550  Thu Jan 22, 2018  0569 Patient resting no acute distress.  Feels better.  Repeat troponin remains unremarkable.  Will switch him to Nexium from Protonix and he will follow-up closely with his PCP.  Return precautions given.  Patient verbalizes understanding and agrees with plan of care.   [JS]    Clinical Course User Index [JS] Irean Hong, MD     ____________________________________________   FINAL CLINICAL IMPRESSION(S) / ED DIAGNOSES  Final diagnoses:  Pain  Nonspecific chest pain  Gastroesophageal reflux disease with esophagitis     ED Discharge Orders         Ordered    esomeprazole (NEXIUM) 40 MG packet  Daily before breakfast,   Status:  Discontinued     01/22/18 0433    esomeprazole (NEXIUM) 40 MG packet  Daily before breakfast     01/22/18 0434           Note:  This document was prepared using Dragon voice recognition software and may include unintentional dictation errors.    Irean Hong, MD 01/22/18 [phone]

## 2018-01-22 NOTE — Discharge Instructions (Signed)
1.  You may take Nexium 40 mg daily (#30). 2.  Eat bland foods and avoid alcohol. 3.  Return to the ER for worsening symptoms, persistent vomiting, difficulty breathing or other concerns.

## 2018-05-17 ENCOUNTER — Emergency Department
Admission: EM | Admit: 2018-05-17 | Discharge: 2018-05-17 | Disposition: A | Discharge status: Home / Self Care | Payer: BLUE CROSS/BLUE SHIELD | Attending: Emergency Medicine | Admitting: Emergency Medicine

## 2018-05-17 ENCOUNTER — Encounter: Payer: Self-pay | Admitting: Emergency Medicine

## 2018-05-17 DIAGNOSIS — J069 Acute upper respiratory infection, unspecified: Secondary | ICD-10-CM | POA: Diagnosis not present

## 2018-05-17 DIAGNOSIS — B9789 Other viral agents as the cause of diseases classified elsewhere: Secondary | ICD-10-CM | POA: Diagnosis not present

## 2018-05-17 DIAGNOSIS — R05 Cough: Secondary | ICD-10-CM | POA: Diagnosis present

## 2018-05-17 HISTORY — DX: Gastro-esophageal reflux disease without esophagitis: K21.9

## 2018-05-17 LAB — INFLUENZA PANEL BY PCR (TYPE A & B)
INFLAPCR: NEGATIVE
Influenza B By PCR: NEGATIVE

## 2018-05-17 MED ORDER — GUAIFENESIN-CODEINE 100-10 MG/5ML PO SOLN: 5.0000 mL | Freq: Four times a day (QID) | ORAL | 0 refills | Status: DC | PRN

## 2018-05-17 NOTE — ED Triage Notes (Signed)
Patient presents to the ED with fever, cough, congestion and body aches since this am.  Patient states his young son has similar symptoms.  Patient is in no obvious distress at this time.  Ambulatory to triage with steady gait.

## 2018-05-17 NOTE — ED Provider Notes (Signed)
[facility] Emergency Department Provider Note   ____________________________________________   None    (approximate)  I have reviewed the triage vital signs and the nursing notes.   HISTORY  Chief Complaint Cough; Fever; and Generalized Body Aches   HPI Brett [name] is a 42 y.o. male presents to the ED with complaint of fever, cough, congestion and body aches that started this morning.  Patient states that his son has similar symptoms.  Patient denies any vomiting or diarrhea.  He states he did have chills when he arrived home this evening.  Currently rates his discomfort as an 8 out of 10.   Past Medical History:  Diagnosis Date  . Acid reflux   . Speech impediment     Patient Active Problem List   Diagnosis Date Noted  . Tremor 05/07/2013    Past Surgical History:  Procedure Laterality Date  . TONSILLECTOMY      Prior to Admission medications   Medication Sig Start Date End Date Taking? Authorizing Provider  diphenoxylate-atropine (LOMOTIL) 2.5-0.025 MG tablet Take 1 tablet by mouth 4 (four) times daily as needed for diarrhea or loose stools. 01/21/18   Tommie Samsook, Jayce G, DO  esomeprazole (NEXIUM) 40 MG packet Take 40 mg by mouth daily before breakfast. 01/22/18   Irean HongSung, Jade J, MD  guaiFENesin-codeine 100-10 MG/5ML syrup Take 5 mLs by mouth every 6 (six) hours as needed for cough. 05/17/18   Tommi RumpsSummers, Emelina Hinch L, PA-C    Allergies Pantoprazole and Penicillins  Family History  Problem Relation Age of Onset  . Healthy Mother   . Pneumonia Father   . Pneumonia Sister   . Pneumonia Brother   . Diabetes Maternal Grandmother   . Pneumonia Sister     Social History Social History   Tobacco Use  . Smoking status: Never Smoker  . Smokeless tobacco: Never Used  Substance Use Topics  . Alcohol use: Yes    Comment: occasionally   . Drug use: Never    Review of Systems Constitutional: Positive fever/chills Eyes: No visual changes. ENT: No  sore throat.  Positive for nasal congestion. Cardiovascular: Denies chest pain. Respiratory: Denies shortness of breath. Gastrointestinal: No abdominal pain.  No nausea, no vomiting.  Genitourinary: Negative for dysuria. Musculoskeletal: Positive for body aches. Skin: Negative for rash. Neurological: Negative for headaches, focal weakness or numbness. ____________________________________________   PHYSICAL EXAM:  VITAL SIGNS: ED Triage Vitals  Enc Vitals Group     BP 05/17/18 1812 127/67     Pulse Rate 05/17/18 1812 (!) 129     Resp 05/17/18 1812 16     Temp 05/17/18 1812 (!) 100.5 F (38.1 C)     Temp Source 05/17/18 1812 Oral     SpO2 05/17/18 1812 92 %     Weight 05/17/18 1822 207 lb (93.9 kg)     Height 05/17/18 1822 6' (1.829 m)     Head Circumference --      Peak Flow --      Pain Score 05/17/18 1821 8     Pain Loc --      Pain Edu? --      Excl. in GC? --     Constitutional: Alert and oriented. Well appearing and in no acute distress. Eyes: Conjunctivae are normal.  Head: Atraumatic. Nose: Mild congestion/rhinnorhea. Mouth/Throat: Mucous membranes are moist.  Oropharynx non-erythematous. Neck: No stridor.   Cardiovascular: Normal rate, regular rhythm. Grossly normal heart sounds.  Good peripheral circulation. Respiratory: Normal respiratory  effort.  No retractions. Lungs CTAB. Gastrointestinal: Bowel sounds normoactive x4 quadrants.  Nontender to palpation x4 quadrants. Musculoskeletal: No lower extremity tenderness nor edema.   Neurologic:  Normal speech and language. No gross focal neurologic deficits are appreciated. No gait instability. Skin:  Skin is warm, dry and intact. No rash noted. Psychiatric: Mood and affect are normal. Speech and behavior are normal.  ____________________________________________   LABS (all labs ordered are listed, but only abnormal results are displayed)  Labs Reviewed  INFLUENZA PANEL BY PCR (TYPE A & B)     PROCEDURES  Procedure(s) performed: None  Procedures  Critical Care performed: No  ____________________________________________   INITIAL IMPRESSION / ASSESSMENT AND PLAN / ED COURSE  As part of my medical decision making, I reviewed the following data within the electronic MEDICAL RECORD NUMBER Notes from prior ED visits and [place] Controlled Substance Database  Patient presents to the ED with complaint of sudden onset of cough, congestion, body aches and fever.  Patient appears to be uncomfortable but nontoxic.  His influenza test was negative for both AMB.  Patient was given a prescription for guaifenesin with codeine as needed for cough and congestion.  He is encouraged to increase fluids and take Tylenol or ibuprofen if needed for body aches and fever.  He is to follow-up with his PCP if any continued problems.  ____________________________________________   FINAL CLINICAL IMPRESSION(S) / ED DIAGNOSES  Final diagnoses:  Viral URI with cough     ED Discharge Orders         Ordered    guaiFENesin-codeine 100-10 MG/5ML syrup  Every 6 hours PRN     05/17/18 2048           Note:  This document was prepared using Dragon voice recognition software and may include unintentional dictation errors.    Tommi RumpsSummers, Savien Mamula L, PA-C 05/17/18 2150    Jene EveryKinner, Robert, MD 05/17/18 2230

## 2018-05-17 NOTE — ED Notes (Signed)
Topaz pad not working. Pt verbally understood discharge prescriptions and follow ups.

## 2018-05-17 NOTE — Discharge Instructions (Signed)
Follow-up with your primary care provider or can no clinic acute care if any continued problems.  Increase fluids.  Tylenol or ibuprofen as needed for fever, aches or headache.  Also you may take guaifenesin with codeine as needed for cough.  Be aware that this medication contains a narcotic and should not be taken if you are driving or operating machinery.  Your test for the flu is negative.

## 2018-05-19 ENCOUNTER — Emergency Department: Payer: BLUE CROSS/BLUE SHIELD

## 2018-05-19 ENCOUNTER — Encounter: Payer: Self-pay | Admitting: Emergency Medicine

## 2018-05-19 ENCOUNTER — Other Ambulatory Visit: Payer: Self-pay

## 2018-05-19 ENCOUNTER — Inpatient Hospital Stay: Payer: BLUE CROSS/BLUE SHIELD

## 2018-05-19 ENCOUNTER — Inpatient Hospital Stay
Admission: EM | Admit: 2018-05-19 | Discharge: 2018-05-28 | DRG: 193 | Disposition: A | Discharge status: Home / Self Care | Payer: BLUE CROSS/BLUE SHIELD | Attending: Internal Medicine | Admitting: Internal Medicine

## 2018-05-19 DIAGNOSIS — R4789 Other speech disturbances: Secondary | ICD-10-CM | POA: Diagnosis present

## 2018-05-19 DIAGNOSIS — R739 Hyperglycemia, unspecified: Secondary | ICD-10-CM | POA: Diagnosis present

## 2018-05-19 DIAGNOSIS — J181 Lobar pneumonia, unspecified organism: Secondary | ICD-10-CM | POA: Diagnosis present

## 2018-05-19 DIAGNOSIS — J9600 Acute respiratory failure, unspecified whether with hypoxia or hypercapnia: Secondary | ICD-10-CM

## 2018-05-19 DIAGNOSIS — Z888 Allergy status to other drugs, medicaments and biological substances status: Secondary | ICD-10-CM

## 2018-05-19 DIAGNOSIS — R7989 Other specified abnormal findings of blood chemistry: Secondary | ICD-10-CM | POA: Diagnosis present

## 2018-05-19 DIAGNOSIS — J9601 Acute respiratory failure with hypoxia: Secondary | ICD-10-CM | POA: Diagnosis present

## 2018-05-19 DIAGNOSIS — R0603 Acute respiratory distress: Secondary | ICD-10-CM

## 2018-05-19 DIAGNOSIS — R6520 Severe sepsis without septic shock: Secondary | ICD-10-CM

## 2018-05-19 DIAGNOSIS — E871 Hypo-osmolality and hyponatremia: Secondary | ICD-10-CM | POA: Diagnosis present

## 2018-05-19 DIAGNOSIS — J9690 Respiratory failure, unspecified, unspecified whether with hypoxia or hypercapnia: Secondary | ICD-10-CM

## 2018-05-19 DIAGNOSIS — Z880 Allergy status to penicillin: Secondary | ICD-10-CM | POA: Diagnosis not present

## 2018-05-19 DIAGNOSIS — Z79891 Long term (current) use of opiate analgesic: Secondary | ICD-10-CM | POA: Diagnosis not present

## 2018-05-19 DIAGNOSIS — E876 Hypokalemia: Secondary | ICD-10-CM | POA: Diagnosis present

## 2018-05-19 DIAGNOSIS — K219 Gastro-esophageal reflux disease without esophagitis: Secondary | ICD-10-CM | POA: Diagnosis present

## 2018-05-19 DIAGNOSIS — A419 Sepsis, unspecified organism: Secondary | ICD-10-CM

## 2018-05-19 DIAGNOSIS — J90 Pleural effusion, not elsewhere classified: Secondary | ICD-10-CM | POA: Diagnosis present

## 2018-05-19 DIAGNOSIS — J189 Pneumonia, unspecified organism: Secondary | ICD-10-CM | POA: Diagnosis present

## 2018-05-19 DIAGNOSIS — R0602 Shortness of breath: Secondary | ICD-10-CM | POA: Diagnosis not present

## 2018-05-19 DIAGNOSIS — Z8701 Personal history of pneumonia (recurrent): Secondary | ICD-10-CM | POA: Diagnosis present

## 2018-05-19 DIAGNOSIS — Z79899 Other long term (current) drug therapy: Secondary | ICD-10-CM

## 2018-05-19 LAB — COMPREHENSIVE METABOLIC PANEL
ALT: 47 U/L — ABNORMAL HIGH (ref 0–44)
AST: 32 U/L (ref 15–41)
Albumin: 3.9 g/dL (ref 3.5–5.0)
Alkaline Phosphatase: 69 U/L (ref 38–126)
Anion gap: 10 (ref 5–15)
BILIRUBIN TOTAL: 1.5 mg/dL — AB (ref 0.3–1.2)
BUN: 17 mg/dL (ref 6–20)
CO2: 26 mmol/L (ref 22–32)
CREATININE: 1.37 mg/dL — AB (ref 0.61–1.24)
Calcium: 9 mg/dL (ref 8.9–10.3)
Chloride: 101 mmol/L (ref 98–111)
Glucose, Bld: 211 mg/dL — ABNORMAL HIGH (ref 70–99)
POTASSIUM: 4 mmol/L (ref 3.5–5.1)
Sodium: 137 mmol/L (ref 135–145)
TOTAL PROTEIN: 7.3 g/dL (ref 6.5–8.1)

## 2018-05-19 LAB — CBC WITH DIFFERENTIAL/PLATELET
Abs Immature Granulocytes: 0.05 10*3/uL (ref 0.00–0.07)
BASOS PCT: 0 %
Basophils Absolute: 0 10*3/uL (ref 0.0–0.1)
EOS PCT: 0 %
Eosinophils Absolute: 0 10*3/uL (ref 0.0–0.5)
HCT: 47.8 % (ref 39.0–52.0)
Hemoglobin: 15.6 g/dL (ref 13.0–17.0)
Immature Granulocytes: 0 %
Lymphocytes Relative: 4 %
Lymphs Abs: 0.6 10*3/uL — ABNORMAL LOW (ref 0.7–4.0)
MCH: 30.5 pg (ref 26.0–34.0)
MCHC: 32.6 g/dL (ref 30.0–36.0)
MCV: 93.4 fL (ref 80.0–100.0)
MONO ABS: 0.8 10*3/uL (ref 0.1–1.0)
Monocytes Relative: 6 %
NEUTROS ABS: 12 10*3/uL — AB (ref 1.7–7.7)
Neutrophils Relative %: 90 %
PLATELETS: 141 10*3/uL — AB (ref 150–400)
RBC: 5.12 MIL/uL (ref 4.22–5.81)
RDW: 12.5 % (ref 11.5–15.5)
WBC: 13.5 10*3/uL — AB (ref 4.0–10.5)
nRBC: 0 % (ref 0.0–0.2)

## 2018-05-19 LAB — LACTIC ACID, PLASMA
LACTIC ACID, VENOUS: 2.5 mmol/L — AB (ref 0.5–1.9)
Lactic Acid, Venous: 1.3 mmol/L (ref 0.5–1.9)

## 2018-05-19 LAB — BLOOD GAS, ARTERIAL
Acid-Base Excess: 0.1 mmol/L (ref 0.0–2.0)
Allens test (pass/fail): POSITIVE — AB
Bicarbonate: 23.8 mmol/L (ref 20.0–28.0)
FIO2: 1
O2 Saturation: 95.6 %
PH ART: 7.44 (ref 7.350–7.450)
Patient temperature: 37
pCO2 arterial: 35 mmHg (ref 32.0–48.0)
pO2, Arterial: 76 mmHg — ABNORMAL LOW (ref 83.0–108.0)

## 2018-05-19 LAB — TROPONIN I: Troponin I: 0.03 ng/mL (ref <0.03)

## 2018-05-19 LAB — PROCALCITONIN: Procalcitonin: 5.65 ng/mL

## 2018-05-19 LAB — EXPECTORATED SPUTUM ASSESSMENT W GRAM STAIN, RFLX TO RESP C

## 2018-05-19 LAB — STREP PNEUMONIAE URINARY ANTIGEN: Strep Pneumo Urinary Antigen: NEGATIVE

## 2018-05-19 LAB — GLUCOSE, CAPILLARY: Glucose-Capillary: 166 mg/dL — ABNORMAL HIGH (ref 70–99)

## 2018-05-19 LAB — EXPECTORATED SPUTUM ASSESSMENT W REFEX TO RESP CULTURE

## 2018-05-19 LAB — MRSA PCR SCREENING: MRSA by PCR: NEGATIVE

## 2018-05-19 LAB — BRAIN NATRIURETIC PEPTIDE: B Natriuretic Peptide: 83 pg/mL (ref 0.0–100.0)

## 2018-05-19 MED ORDER — MORPHINE SULFATE (PF) 4 MG/ML IV SOLN
INTRAVENOUS | Status: AC
  Filled 2018-05-19: qty 1

## 2018-05-19 MED ORDER — MELOXICAM 7.5 MG PO TABS: 15.0000 mg | ORAL_TABLET | Freq: Every day | ORAL | Status: DC

## 2018-05-19 MED ORDER — ACETAMINOPHEN 325 MG PO TABS
650.0000 mg | ORAL_TABLET | Freq: Four times a day (QID) | ORAL | Status: DC | PRN
  Administered 2018-05-19 – 2018-05-22 (×5): 650 mg via ORAL
  Filled 2018-05-19 (×5): qty 2

## 2018-05-19 MED ORDER — ENOXAPARIN SODIUM 40 MG/0.4ML ~~LOC~~ SOLN
40.0000 mg | SUBCUTANEOUS | Status: DC
  Administered 2018-05-20 – 2018-05-27 (×8): 40 mg via SUBCUTANEOUS
  Filled 2018-05-19 (×8): qty 0.4

## 2018-05-19 MED ORDER — FAMOTIDINE 20 MG PO TABS
20.0000 mg | ORAL_TABLET | Freq: Every day | ORAL | Status: DC
  Administered 2018-05-20 – 2018-05-28 (×9): 20 mg via ORAL
  Filled 2018-05-19 (×3): qty 1
  Filled 2018-05-19: qty 2
  Filled 2018-05-19 (×6): qty 1

## 2018-05-19 MED ORDER — VANCOMYCIN HCL 10 G IV SOLR
1250.0000 mg | Freq: Two times a day (BID) | INTRAVENOUS | Status: DC
  Administered 2018-05-20 (×3): 1250 mg via INTRAVENOUS
  Filled 2018-05-19 (×8): qty 1250

## 2018-05-19 MED ORDER — ONDANSETRON HCL 4 MG PO TABS: 4.0000 mg | ORAL_TABLET | Freq: Four times a day (QID) | ORAL | Status: DC | PRN

## 2018-05-19 MED ORDER — GUAIFENESIN ER 600 MG PO TB12
600.0000 mg | ORAL_TABLET | Freq: Two times a day (BID) | ORAL | Status: DC
  Administered 2018-05-19 – 2018-05-28 (×19): 600 mg via ORAL
  Filled 2018-05-19 (×19): qty 1

## 2018-05-19 MED ORDER — KETOROLAC TROMETHAMINE 30 MG/ML IJ SOLN
15.0000 mg | Freq: Once | INTRAMUSCULAR | Status: AC
  Administered 2018-05-19: 15 mg via INTRAVENOUS
  Filled 2018-05-19: qty 1

## 2018-05-19 MED ORDER — IBUPROFEN 400 MG PO TABS
800.0000 mg | ORAL_TABLET | Freq: Four times a day (QID) | ORAL | Status: DC | PRN
  Administered 2018-05-19 – 2018-05-20 (×2): 800 mg via ORAL
  Filled 2018-05-19 (×2): qty 2

## 2018-05-19 MED ORDER — SODIUM CHLORIDE 0.9 % IV SOLN: INTRAVENOUS | Status: DC

## 2018-05-19 MED ORDER — MORPHINE SULFATE (PF) 2 MG/ML IV SOLN
1.0000 mg | Freq: Four times a day (QID) | INTRAVENOUS | Status: DC | PRN
  Administered 2018-05-19 – 2018-05-21 (×2): 1 mg via INTRAVENOUS
  Filled 2018-05-19: qty 1

## 2018-05-19 MED ORDER — MORPHINE SULFATE (PF) 4 MG/ML IV SOLN
4.0000 mg | Freq: Once | INTRAVENOUS | Status: AC
  Administered 2018-05-19: 4 mg via INTRAVENOUS
  Filled 2018-05-19: qty 1

## 2018-05-19 MED ORDER — MORPHINE SULFATE (PF) 4 MG/ML IV SOLN
4.0000 mg | Freq: Once | INTRAVENOUS | Status: AC
  Administered 2018-05-19: 4 mg via INTRAVENOUS

## 2018-05-19 MED ORDER — SODIUM CHLORIDE 0.9 % IV SOLN
500.0000 mg | INTRAVENOUS | Status: DC
  Administered 2018-05-20 – 2018-05-21 (×2): 500 mg via INTRAVENOUS
  Filled 2018-05-19 (×3): qty 500

## 2018-05-19 MED ORDER — SODIUM CHLORIDE 0.9 % IV SOLN
500.0000 mg | INTRAVENOUS | Status: DC
  Administered 2018-05-19: 500 mg via INTRAVENOUS
  Filled 2018-05-19 (×2): qty 500

## 2018-05-19 MED ORDER — GUAIFENESIN-CODEINE 100-10 MG/5ML PO SOLN
10.0000 mL | Freq: Four times a day (QID) | ORAL | Status: DC | PRN
  Administered 2018-05-19 – 2018-05-28 (×25): 10 mL via ORAL
  Filled 2018-05-19 (×25): qty 10

## 2018-05-19 MED ORDER — SODIUM CHLORIDE 0.9 % IV BOLUS
1000.0000 mL | Freq: Once | INTRAVENOUS | Status: AC
  Administered 2018-05-19: 1000 mL via INTRAVENOUS

## 2018-05-19 MED ORDER — OXYCODONE-ACETAMINOPHEN 5-325 MG PO TABS
1.0000 | ORAL_TABLET | Freq: Four times a day (QID) | ORAL | Status: DC | PRN
  Administered 2018-05-20: 1 via ORAL
  Administered 2018-05-20: 2 via ORAL
  Administered 2018-05-20: 1 via ORAL
  Administered 2018-05-22 – 2018-05-23 (×6): 2 via ORAL
  Administered 2018-05-24: 20:00:00 1 via ORAL
  Administered 2018-05-24 (×2): 2 via ORAL
  Administered 2018-05-25: 1 via ORAL
  Administered 2018-05-25: 2 via ORAL
  Filled 2018-05-19: qty 1
  Filled 2018-05-19 (×2): qty 2
  Filled 2018-05-19: qty 1
  Filled 2018-05-19: qty 2
  Filled 2018-05-19: qty 1
  Filled 2018-05-19 (×7): qty 2
  Filled 2018-05-19: qty 1

## 2018-05-19 MED ORDER — ONDANSETRON HCL 4 MG/2ML IJ SOLN
4.0000 mg | Freq: Once | INTRAMUSCULAR | Status: AC
  Administered 2018-05-19: 4 mg via INTRAVENOUS
  Filled 2018-05-19: qty 2

## 2018-05-19 MED ORDER — IPRATROPIUM-ALBUTEROL 0.5-2.5 (3) MG/3ML IN SOLN
3.0000 mL | Freq: Once | RESPIRATORY_TRACT | Status: AC
  Administered 2018-05-19: 3 mL via RESPIRATORY_TRACT
  Filled 2018-05-19: qty 3

## 2018-05-19 MED ORDER — IPRATROPIUM-ALBUTEROL 0.5-2.5 (3) MG/3ML IN SOLN
3.0000 mL | Freq: Four times a day (QID) | RESPIRATORY_TRACT | Status: DC | PRN
  Administered 2018-05-19: 3 mL via RESPIRATORY_TRACT
  Filled 2018-05-19 (×2): qty 3

## 2018-05-19 MED ORDER — MORPHINE SULFATE (PF) 2 MG/ML IV SOLN
INTRAVENOUS | Status: AC
  Administered 2018-05-19: 1 mg via INTRAVENOUS
  Filled 2018-05-19: qty 1

## 2018-05-19 MED ORDER — SODIUM CHLORIDE 0.9 % IV SOLN
INTRAVENOUS | Status: DC
  Administered 2018-05-19 – 2018-05-20 (×3): via INTRAVENOUS

## 2018-05-19 MED ORDER — VANCOMYCIN HCL IN DEXTROSE 1-5 GM/200ML-% IV SOLN
1000.0000 mg | Freq: Once | INTRAVENOUS | Status: AC
  Administered 2018-05-19: 1000 mg via INTRAVENOUS
  Filled 2018-05-19: qty 200

## 2018-05-19 MED ORDER — ENOXAPARIN SODIUM 40 MG/0.4ML ~~LOC~~ SOLN: 40.0000 mg | SUBCUTANEOUS | Status: DC

## 2018-05-19 MED ORDER — SODIUM CHLORIDE 0.9 % IV SOLN
2.0000 g | INTRAVENOUS | Status: DC
  Administered 2018-05-19: 2 g via INTRAVENOUS
  Filled 2018-05-19 (×2): qty 20

## 2018-05-19 MED ORDER — ONDANSETRON HCL 4 MG/2ML IJ SOLN: 4.0000 mg | Freq: Four times a day (QID) | INTRAMUSCULAR | Status: DC | PRN

## 2018-05-19 MED ORDER — IPRATROPIUM-ALBUTEROL 0.5-2.5 (3) MG/3ML IN SOLN
3.0000 mL | RESPIRATORY_TRACT | Status: DC
  Administered 2018-05-19 – 2018-05-27 (×44): 3 mL via RESPIRATORY_TRACT
  Filled 2018-05-19 (×42): qty 3

## 2018-05-19 MED ORDER — SODIUM CHLORIDE 0.9 % IV SOLN
1.0000 g | Freq: Three times a day (TID) | INTRAVENOUS | Status: DC
  Administered 2018-05-19 – 2018-05-22 (×9): 1 g via INTRAVENOUS
  Filled 2018-05-19 (×11): qty 1

## 2018-05-19 MED ORDER — ACETAMINOPHEN 650 MG RE SUPP: 650.0000 mg | Freq: Four times a day (QID) | RECTAL | Status: DC | PRN

## 2018-05-19 NOTE — Progress Notes (Signed)
CODE SEPSIS - PHARMACY COMMUNICATION  **Broad Spectrum Antibiotics should be administered within 1 hour of Sepsis diagnosis**  Time Code Sepsis Called/Page Received: [id number]  Antibiotics Ordered: ceftriaxone/azithromycin  Time of 1st antibiotic administration: 0900  Additional action taken by pharmacy: NA  If necessary, Name of Provider/Nurse Contacted: NA   Pricilla RiffleAbby K Ellington, PharmD Pharmacy Resident  05/19/2018 9:57 AM

## 2018-05-19 NOTE — H&P (Signed)
[employer] -  at [facility]   PATIENT NAME: Brett [name]    MR#:  [id number]  DATE OF BIRTH:  04-Oct-1975  DATE OF ADMISSION:  05/19/2018  PRIMARY CARE PHYSICIAN: Patient, No Pcp Per   REQUESTING/REFERRING PHYSICIAN: Willy Eddy, MD  CHIEF COMPLAINT:   Chief Complaint  Patient presents with  . Cough  . Shortness of Breath    HISTORY OF PRESENT ILLNESS: Brett [name]  is a 42 y.o. male with a known history of GERD and speech impediment who is presenting to the emergency room complaining of cough and shortness of breath.  He was seen in the emergency room and had the flu test done which was negative he was discharged home diagnosed with viral bronchitis.  Patient continued to have progressive shortness of breath and cough.  He came to the emergency room and noted to have pneumonia.  He states that he had a fever earlier this morning of 100.9.  He also has productive cough of whitish sputum.  He has chest pain with coughing.       PAST MEDICAL HISTORY:   Past Medical History:  Diagnosis Date  . Acid reflux   . Speech impediment     PAST SURGICAL HISTORY:  Past Surgical History:  Procedure Laterality Date  . TONSILLECTOMY      SOCIAL HISTORY:  Social History   Tobacco Use  . Smoking status: Never Smoker  . Smokeless tobacco: Never Used  Substance Use Topics  . Alcohol use: Yes    Comment: occasionally     FAMILY HISTORY:  Family History  Problem Relation Age of Onset  . Healthy Mother   . Pneumonia Father   . Pneumonia Sister   . Pneumonia Brother   . Diabetes Maternal Grandmother   . Pneumonia Sister     DRUG ALLERGIES:  Allergies  Allergen Reactions  . Pantoprazole Hives  . Penicillins Hives    REVIEW OF SYSTEMS:   CONSTITUTIONAL: Positive fever, fatigue or weakness.  EYES: No blurred or double vision.  EARS, NOSE, AND THROAT: No tinnitus or ear pain.  RESPIRATORY: Positive cough, positive shortness of breath,  positive wheezing or no hemoptysis.  CARDIOVASCULAR: No chest pain, orthopnea, edema.  GASTROINTESTINAL: No nausea, vomiting, diarrhea or abdominal pain.  GENITOURINARY: No dysuria, hematuria.  ENDOCRINE: No polyuria, nocturia,  HEMATOLOGY: No anemia, easy bruising or bleeding SKIN: No rash or lesion. MUSCULOSKELETAL: No joint pain or arthritis.   NEUROLOGIC: No tingling, numbness, weakness.  PSYCHIATRY: No anxiety or depression.   MEDICATIONS AT HOME:  Prior to Admission medications   Medication Sig Start Date End Date Taking? Authorizing Provider  esomeprazole (NEXIUM) 40 MG packet Take 40 mg by mouth daily before breakfast. 01/22/18  Yes Irean Hong, MD  diphenoxylate-atropine (LOMOTIL) 2.5-0.025 MG tablet Take 1 tablet by mouth 4 (four) times daily as needed for diarrhea or loose stools. Patient not taking: Reported on 05/19/2018 01/21/18   Tommie Sams, DO  guaiFENesin-codeine 100-10 MG/5ML syrup Take 5 mLs by mouth every 6 (six) hours as needed for cough. Patient not taking: Reported on 05/19/2018 05/17/18   Tommi Rumps, PA-C  ibuprofen (ADVIL,MOTRIN) 800 MG tablet Take 800 mg by mouth every 6 (six) hours as needed.    [provider]  meloxicam (MOBIC) 15 MG tablet Take 15 mg by mouth daily.    [provider]      PHYSICAL EXAMINATION:   VITAL SIGNS: Blood pressure 134/74, pulse (!) 116, temperature 98.7  F (37.1 C), temperature source Oral, resp. rate (!) 30, height 6' (1.829 m), weight 93.9 kg, SpO2 94 %.  GENERAL:  42 y.o.-year-old patient lying in the bed with no acute distress.  EYES: Pupils equal, round, reactive to light and accommodation. No scleral icterus. Extraocular muscles intact.  HEENT: Head atraumatic, normocephalic. Oropharynx and nasopharynx clear.  NECK:  Supple, no jugular venous distention. No thyroid enlargement, no tenderness.  LUNGS: Rhonchus breath sounds bilaterally with accessory muscle usage CARDIOVASCULAR: S1, S2 normal. No  murmurs, rubs, or gallops.  ABDOMEN: Soft, nontender, nondistended. Bowel sounds present. No organomegaly or mass.  EXTREMITIES: No pedal edema, cyanosis, or clubbing.  NEUROLOGIC: Cranial nerves II through XII are intact. Muscle strength 5/5 in all extremities. Sensation intact. Gait not checked.  PSYCHIATRIC: The patient is alert and oriented x 3.  SKIN: No obvious rash, lesion, or ulcer.   LABORATORY PANEL:   CBC Recent Labs  Lab 05/19/18 0819  WBC 13.5*  HGB 15.6  HCT 47.8  PLT 141*  MCV 93.4  MCH 30.5  MCHC 32.6  RDW 12.5  LYMPHSABS 0.6*  MONOABS 0.8  EOSABS 0.0  BASOSABS 0.0   ------------------------------------------------------------------------------------------------------------------  Chemistries  Recent Labs  Lab 05/19/18 0819  NA 137  K 4.0  CL 101  CO2 26  GLUCOSE 211*  BUN 17  CREATININE 1.37*  CALCIUM 9.0  AST 32  ALT 47*  ALKPHOS 69  BILITOT 1.5*   ------------------------------------------------------------------------------------------------------------------ estimated creatinine clearance is 84.4 mL/min (A) (by C-G formula based on SCr of 1.37 mg/dL (H)). ------------------------------------------------------------------------------------------------------------------ No results for input(s): TSH, T4TOTAL, T3FREE, THYROIDAB in the last 72 hours.  Invalid input(s): FREET3   Coagulation profile No results for input(s): INR, PROTIME in the last 168 hours. ------------------------------------------------------------------------------------------------------------------- No results for input(s): DDIMER in the last 72 hours. -------------------------------------------------------------------------------------------------------------------  Cardiac Enzymes Recent Labs  Lab 05/19/18 0819  TROPONINI 0.03*   ------------------------------------------------------------------------------------------------------------------ Invalid input(s):  POCBNP  ---------------------------------------------------------------------------------------------------------------  Urinalysis    Component Value Date/Time   COLORURINE YELLOW (A) 01/20/2018 0914   APPEARANCEUR CLEAR (A) 01/20/2018 0914   LABSPEC 1.023 01/20/2018 0914   PHURINE 5.0 01/20/2018 0914   GLUCOSEU NEGATIVE 01/20/2018 0914   HGBUR NEGATIVE 01/20/2018 0914   BILIRUBINUR NEGATIVE 01/20/2018 0914   KETONESUR NEGATIVE 01/20/2018 0914   PROTEINUR NEGATIVE 01/20/2018 0914   UROBILINOGEN 1.0 03/27/2009 0426   NITRITE NEGATIVE 01/20/2018 0914   LEUKOCYTESUR NEGATIVE 01/20/2018 0914     RADIOLOGY: Dg Chest 2 View  Result Date: 05/19/2018 CLINICAL DATA:  Cough and fever; shortness of breath EXAM: CHEST - 2 VIEW COMPARISON:  January 22, 2018 FINDINGS: There is patchy airspace opacity in the lung bases, slightly more on the left than on the right. Lungs elsewhere are clear. Heart is borderline enlarged with slight pulmonary venous hypertension. No adenopathy. No bone lesions. IMPRESSION: Patchy infiltrate in the bases, felt to represent pneumonia. Pulmonary vascular congestion. Electronically Signed   By: Bretta BangWilliam  Woodruff III M.D.   On: 05/19/2018 08:19    EKG: Orders placed or performed in visit on 05/19/18  . EKG 12-Lead  . EKG 12-Lead  . EKG 12-Lead    IMPRESSION AND PLAN: Patient is a 42-year African-American male presenting with shortness of breath   1.  Community-acquired pneumonia we will treat with IV ceftriaxone azithromycin Flu test has been done 2 days ago which was negative. We will give him antitussives and mucolytic's Use PRN nebulizers as needed  2.  GERD continue PPI   3.  Miscellaneous  Lovenox for DVT prophylaxis       All the records are reviewed and case discussed with ED provider. Management plans discussed with the patient, family and they are in agreement.  CODE STATUS: Advance Directive Documentation     Most Recent Value  Type  of Advance Directive  Healthcare Power of Attorney  Pre-existing out of facility DNR order (yellow form or pink MOST form)  -  "MOST" Form in Place?  -       TOTAL TIME TAKING CARE OF THIS PATIENT: 55 minutes.    Auburn BilberryShreyang Janiqua Friscia M.D on 05/19/2018 at 10:31 AM  Between 7am to 6pm - Pager - [phone]  After 6pm go to [email or web address] - password [email or web address] [facility]  [employer] Office  [phone]  CC: Primary care physician; Patient, No Pcp Per

## 2018-05-19 NOTE — ED Notes (Signed)
First Nurse Note: Patient to ED via [facility] with complaint of chest pain from coughing.  Temp 100 at home.  Sitting in WC, wearing mask.  Seen here on 12/29 and did not get RX for cough syrup filled, taking OTC guaifenesin.

## 2018-05-19 NOTE — Consult Note (Signed)
Reason for Consult: Respiratory distress Referring Physician: Hospitalist  [address] [place] is an 42 y.o. male.  HPI: Mr. Brett [name] is a 42 year old gentleman with a past medical history remarkable for acid reflux and speech impediment presented to the emergency room complaining of cough, increasing shortness of breath, fever and sputum production.  Was seen in emergency department initially had a negative flu test and was subsequently discharged home.  Represented with progressive increasing shortness of breath and admitted to the floor for community-acquired pneumonia.  Initially started on azithromycin and Rocephin.  Has been developing worsening hypoxemic respiratory failure and has been subsequently transferred to the intensive care unit.  He complains of recent sick contacts  Past Medical History:  Diagnosis Date  . Acid reflux   . Speech impediment     Past Surgical History:  Procedure Laterality Date  . TONSILLECTOMY      Family History  Problem Relation Age of Onset  . Healthy Mother   . Pneumonia Father   . Pneumonia Sister   . Pneumonia Brother   . Diabetes Maternal Grandmother   . Pneumonia Sister     Social History:  reports that he has never smoked. He has never used smokeless tobacco. He reports current alcohol use. He reports that he does not use drugs.  Allergies:  Allergies  Allergen Reactions  . Pantoprazole Hives  . Penicillins Hives    Medications: I have reviewed the patient's current medications.  Results for orders placed or performed during the hospital encounter of 05/19/18 (from the past 48 hour(s))  Comprehensive metabolic panel     Status: Abnormal   Collection Time: 05/19/18  8:19 AM  Result Value Ref Range   Sodium 137 135 - 145 mmol/L   Potassium 4.0 3.5 - 5.1 mmol/L   Chloride 101 98 - 111 mmol/L   CO2 26 22 - 32 mmol/L   Glucose, Bld 211 (H) 70 - 99 mg/dL   BUN 17 6 - 20 mg/dL   Creatinine, Ser 3.08 (H) 0.61 - 1.24 mg/dL   Calcium 9.0 8.9  - 65.7 mg/dL   Total Protein 7.3 6.5 - 8.1 g/dL   Albumin 3.9 3.5 - 5.0 g/dL   AST 32 15 - 41 U/L   ALT 47 (H) 0 - 44 U/L   Alkaline Phosphatase 69 38 - 126 U/L   Total Bilirubin 1.5 (H) 0.3 - 1.2 mg/dL   GFR calc non Af Amer >60 >60 mL/min   GFR calc Af Amer >60 >60 mL/min   Anion gap 10 5 - 15    Comment: Performed at [facility], [address] [place], [place] [address]  CBC with Differential     Status: Abnormal   Collection Time: 05/19/18  8:19 AM  Result Value Ref Range   WBC 13.5 (H) 4.0 - 10.5 K/uL   RBC 5.12 4.22 - 5.81 MIL/uL   Hemoglobin 15.6 13.0 - 17.0 g/dL   HCT 29.5 28.4 - 13.2 %   MCV 93.4 80.0 - 100.0 fL   MCH 30.5 26.0 - 34.0 pg   MCHC 32.6 30.0 - 36.0 g/dL   RDW 44.0 10.2 - 72.5 %   Platelets 141 (L) 150 - 400 K/uL   nRBC 0.0 0.0 - 0.2 %   Neutrophils Relative % 90 %   Neutro Abs 12.0 (H) 1.7 - 7.7 K/uL   Lymphocytes Relative 4 %   Lymphs Abs 0.6 (L) 0.7 - 4.0 K/uL   Monocytes Relative 6 %  Monocytes Absolute 0.8 0.1 - 1.0 K/uL   Eosinophils Relative 0 %   Eosinophils Absolute 0.0 0.0 - 0.5 K/uL   Basophils Relative 0 %   Basophils Absolute 0.0 0.0 - 0.1 K/uL   Immature Granulocytes 0 %   Abs Immature Granulocytes 0.05 0.00 - 0.07 K/uL    Comment: Performed at [facility], [address] [place], [place] [address]  Troponin I - ONCE - STAT     Status: Abnormal   Collection Time: 05/19/18  8:19 AM  Result Value Ref Range   Troponin I 0.03 (HH) <0.03 ng/mL    Comment: CRITICAL RESULT CALLED TO, READ BACK BY AND VERIFIED WITH KIM GAULT AT [id number] ON 05/19/18 [facility]. Performed at [facility], [address] [place], [place] [address]   Brain natriuretic peptide     Status: None   Collection Time: 05/19/18  8:19 AM  Result Value Ref Range   B Natriuretic Peptide 83.0 0.0 - 100.0 pg/mL    Comment: Performed at [facility], [address] [place], [place] [address]  Procalcitonin - Baseline     Status: None    Collection Time: 05/19/18  8:19 AM  Result Value Ref Range   Procalcitonin 5.65 ng/mL    Comment:        Interpretation: PCT > 2 ng/mL: Systemic infection (sepsis) is likely, unless other causes are known. (NOTE)       Sepsis PCT Algorithm           Lower Respiratory Tract                                      Infection PCT Algorithm    ----------------------------     ----------------------------         PCT < 0.25 ng/mL                PCT < 0.10 ng/mL         Strongly encourage             Strongly discourage   discontinuation of antibiotics    initiation of antibiotics    ----------------------------     -----------------------------       PCT 0.25 - 0.50 ng/mL            PCT 0.10 - 0.25 ng/mL               OR       >80% decrease in PCT            Discourage initiation of                                            antibiotics      Encourage discontinuation           of antibiotics    ----------------------------     -----------------------------         PCT >= 0.50 ng/mL              PCT 0.26 - 0.50 ng/mL               AND       <80% decrease in PCT              Encourage initiation of  antibiotics       Encourage continuation           of antibiotics    ----------------------------     -----------------------------        PCT >= 0.50 ng/mL                  PCT > 0.50 ng/mL               AND         increase in PCT                  Strongly encourage                                      initiation of antibiotics    Strongly encourage escalation           of antibiotics                                     -----------------------------                                           PCT <= 0.25 ng/mL                                                 OR                                        > 80% decrease in PCT                                     Discontinue / Do not initiate                                              antibiotics Performed at [facility], [address] [place], [place] [address]   Lactic acid, plasma     Status: Abnormal   Collection Time: 05/19/18  8:36 AM  Result Value Ref Range   Lactic Acid, Venous 2.5 (HH) 0.5 - 1.9 mmol/L    Comment: CRITICAL RESULT CALLED TO, READ BACK BY AND VERIFIED WITH KIM GAULT AT 0909 ON 05/19/2018 JJB Performed at [facility], [address] [place], [place] [address]   Lactic acid, plasma     Status: None   Collection Time: 05/19/18 11:11 AM  Result Value Ref Range   Lactic Acid, Venous 1.3 0.5 - 1.9 mmol/L    Comment: Performed at [facility], [address] [place], [place] [address]  Blood gas, arterial     Status: Abnormal   Collection Time: 05/19/18  3:36 PM  Result Value Ref Range   FIO2 1.00    Delivery systems NON-REBREATHER OXYGEN MASK    pH, Arterial 7.44 7.350 -  7.450   pCO2 arterial 35 32.0 - 48.0 mmHg   pO2, Arterial 76 (L) 83.0 - 108.0 mmHg   Bicarbonate 23.8 20.0 - 28.0 mmol/L   Acid-Base Excess 0.1 0.0 - 2.0 mmol/L   O2 Saturation 95.6 %   Patient temperature 37.0    Collection site RIGHT RADIAL    Sample type ARTERIAL DRAW    Allens test (pass/fail) POSITIVE (A) PASS    Comment: Performed at [facility], [address] [place], [place] [address]  Glucose, capillary     Status: Abnormal   Collection Time: 05/19/18  4:05 PM  Result Value Ref Range   Glucose-Capillary 166 (H) 70 - 99 mg/dL    Dg Chest 2 View  Result Date: 05/19/2018 CLINICAL DATA:  Cough and fever; shortness of breath EXAM: CHEST - 2 VIEW COMPARISON:  January 22, 2018 FINDINGS: There is patchy airspace opacity in the lung bases, slightly more on the left than on the right. Lungs elsewhere are clear. Heart is borderline enlarged with slight pulmonary venous hypertension. No adenopathy. No bone lesions. IMPRESSION: Patchy infiltrate in the bases, felt to represent pneumonia. Pulmonary vascular congestion.  Electronically Signed   By: Bretta Bang III M.D.   On: 05/19/2018 08:19    ROS  Pertinent positives please see HPI all else negative Blood pressure 124/68, pulse (!) 110, temperature (!) 102.5 F (39.2 C), temperature source Oral, resp. rate (!) 32, height 6' (1.829 m), weight 93.9 kg, SpO2 91 %. Physical Exam Patient is awake, alert, in respiratory distress on nonrebreather mask.  Is saturating at 95% on the monitor Vital signs: Please see the above listed vital signs HEENT: Trachea midline, no thyromegaly appreciated, no oral lesions appreciated, no jugular venous distention, accessory muscle utilization noted Cardiovascular: Tachycardia sinus mechanism noted on monitor Pulmonary: Crackles appreciated diffusely Abdominal: Positive bowel sounds, soft exam Extremities: No clubbing, cyanosis or edema noted Neurologic: Cranial nerves are grossly intact without any focal deficits appreciated Cutaneous: No rashes or lesions noted  Pertinent labs are remarkable for troponin of 0.03, lactic acid of 2.5, glucose of 211, creatinine 1.37, ALT of 47 total bilirubin of 1.5 white count 13.5, platelet count 141.  Chest x-ray performed reveals right lower lobe consolidation, possible atelectasis and coexisting effusion  Assessment/Plan:  Respiratory distress.  Patient with pneumonia, febrile, white count, sputum production, recent sick contacts with progressive changes on chest x-ray.  Will place on heated high flow, broaden antibiotic coverage to include vancomycin, cefepime and azithromycin.  We will send off urine for Legionella, urine pneumococcus, sputum specimen for Gram stain and CNS.  Will monitor closely in the intensive care unit  Elevated lactic acid at 2.5 secondary to sepsis  Minimal elevation in troponin at 0.03, EKG does not reveal any ischemic changes  Critical care time 30 minutes  Tora Kindred, DO  Tannis Burstein 05/19/2018, 4:13 PM

## 2018-05-19 NOTE — Progress Notes (Signed)
[employer] [facility] [place] [facility]                                                                                                                                                                                  Patient Demographics   Brett [name], is a 42 y.o. male, DOB - 07/07/1975, WUJ:[id number]  Admit date - 05/19/2018   Admitting Physician Auburn BilberryShreyang Alejah Aristizabal, MD  Outpatient Primary MD for the patient is Patient, No Pcp Per   LOS - 0  Subjective: Patient is respiratory status is worsening called by nurse.  Now on full facemask. He states that his breathing is worse.   Review of Systems:   CONSTITUTIONAL: No documented fever. No fatigue, weakness. No weight gain, no weight loss.  EYES: No blurry or double vision.  ENT: No tinnitus. No postnasal drip. No redness of the oropharynx.  RESPIRATORY: No cough, no wheeze, no hemoptysis.  Positive dyspnea.  CARDIOVASCULAR: No chest pain. No orthopnea. No palpitations. No syncope.  GASTROINTESTINAL: No nausea, no vomiting or diarrhea. No abdominal pain. No melena or hematochezia.  GENITOURINARY: No dysuria or hematuria.  ENDOCRINE: No polyuria or nocturia. No heat or cold intolerance.  HEMATOLOGY: No anemia. No bruising. No bleeding.  INTEGUMENTARY: No rashes. No lesions.  MUSCULOSKELETAL: No arthritis. No swelling. No gout.  NEUROLOGIC: No numbness, tingling, or ataxia. No seizure-type activity.  PSYCHIATRIC: No anxiety. No insomnia. No ADD.    Vitals:   Vitals:   05/19/18 1025 05/19/18 1423 05/19/18 1536 05/19/18 1539  BP: 134/74 128/83 124/68   Pulse: (!) 116 (!) 112 (!) 110   Resp: (!) 30 (!) 32    Temp: 98.7 F (37.1 C) 98.8 F (37.1 C) (!) 102.5 F (39.2 C)   TempSrc: Oral Oral Oral   SpO2: 94% 94% (!) 84% 91%  Weight:      Height:        Wt Readings from Last 3 Encounters:  05/19/18 93.9 kg  05/17/18 93.9 kg  01/21/18 95.7 kg     Intake/Output Summary (Last 24 hours) at 05/19/2018 1548 Last data  filed at 05/19/2018 1400 Gross per 24 hour  Intake 2352.49 ml  Output -  Net 2352.49 ml    Physical Exam:   GENERAL: Pleasant-appearing in no apparent distress.  HEAD, EYES, EARS, NOSE AND THROAT: Atraumatic, normocephalic. Extraocular muscles are intact. Pupils equal and reactive to light. Sclerae anicteric. No conjunctival injection. No oro-pharyngeal erythema.  NECK: Supple. There is no jugular venous distention. No bruits, no lymphadenopathy, no thyromegaly.  HEART: Regular rate and rhythm,. No murmurs, no rubs, no clicks.  LUNGS: Positive accessory muscle usage bilateral rhonchus breath sound aBDOMEN: Soft,  flat, nontender, nondistended. Has good bowel sounds. No hepatosplenomegaly appreciated.  EXTREMITIES: No evidence of any cyanosis, clubbing, or peripheral edema.  +2 pedal and radial pulses bilaterally.  NEUROLOGIC: The patient is alert, awake, and oriented x3 with no focal motor or sensory deficits appreciated bilaterally.  SKIN: Moist and warm with no rashes appreciated.  Psych: Not anxious, depressed LN: No inguinal LN enlargement    Antibiotics   Anti-infectives (From admission, onward)   Start     Dose/Rate Route Frequency Ordered Stop   05/19/18 0845  cefTRIAXone (ROCEPHIN) 2 g in sodium chloride 0.9 % 100 mL IVPB     2 g 200 mL/hr over 30 Minutes Intravenous Every 24 hours 05/19/18 0830     05/19/18 0845  azithromycin (ZITHROMAX) 500 mg in sodium chloride 0.9 % 250 mL IVPB     500 mg 250 mL/hr over 60 Minutes Intravenous Every 24 hours 05/19/18 0830        Medications   Scheduled Meds: . enoxaparin (LOVENOX) injection  40 mg Subcutaneous Q24H  . [START ON 05/20/2018] esomeprazole  40 mg Oral QAC breakfast  . guaiFENesin  600 mg Oral BID  . meloxicam  15 mg Oral Daily  . morphine       Continuous Infusions: . sodium chloride    . sodium chloride 100 mL/hr at 05/19/18 1116  . azithromycin Stopped (05/19/18 1003)  . cefTRIAXone (ROCEPHIN)  IV Stopped  (05/19/18 1025)   PRN Meds:.acetaminophen **OR** acetaminophen, guaiFENesin-codeine, ibuprofen, ipratropium-albuterol, ondansetron **OR** ondansetron (ZOFRAN) IV   Data Review:   Micro Results No results found for this or any previous visit (from the past 240 hour(s)).  Radiology Reports Dg Chest 2 View  Result Date: 05/19/2018 CLINICAL DATA:  Cough and fever; shortness of breath EXAM: CHEST - 2 VIEW COMPARISON:  January 22, 2018 FINDINGS: There is patchy airspace opacity in the lung bases, slightly more on the left than on the right. Lungs elsewhere are clear. Heart is borderline enlarged with slight pulmonary venous hypertension. No adenopathy. No bone lesions. IMPRESSION: Patchy infiltrate in the bases, felt to represent pneumonia. Pulmonary vascular congestion. Electronically Signed   By: Bretta BangWilliam  Woodruff III M.D.   On: 05/19/2018 08:19     CBC Recent Labs  Lab 05/19/18 0819  WBC 13.5*  HGB 15.6  HCT 47.8  PLT 141*  MCV 93.4  MCH 30.5  MCHC 32.6  RDW 12.5  LYMPHSABS 0.6*  MONOABS 0.8  EOSABS 0.0  BASOSABS 0.0    Chemistries  Recent Labs  Lab 05/19/18 0819  NA 137  K 4.0  CL 101  CO2 26  GLUCOSE 211*  BUN 17  CREATININE 1.37*  CALCIUM 9.0  AST 32  ALT 47*  ALKPHOS 69  BILITOT 1.5*   ------------------------------------------------------------------------------------------------------------------ estimated creatinine clearance is 84.4 mL/min (A) (by C-G formula based on SCr of 1.37 mg/dL (H)). ------------------------------------------------------------------------------------------------------------------ No results for input(s): HGBA1C in the last 72 hours. ------------------------------------------------------------------------------------------------------------------ No results for input(s): CHOL, HDL, LDLCALC, TRIG, CHOLHDL, LDLDIRECT in the last 72  hours. ------------------------------------------------------------------------------------------------------------------ No results for input(s): TSH, T4TOTAL, T3FREE, THYROIDAB in the last 72 hours.  Invalid input(s): FREET3 ------------------------------------------------------------------------------------------------------------------ No results for input(s): VITAMINB12, FOLATE, FERRITIN, TIBC, IRON, RETICCTPCT in the last 72 hours.  Coagulation profile No results for input(s): INR, PROTIME in the last 168 hours.  No results for input(s): DDIMER in the last 72 hours.  Cardiac Enzymes Recent Labs  Lab 05/19/18 0819  TROPONINI 0.03*   ------------------------------------------------------------------------------------------------------------------ Invalid input(s): POCBNP  Assessment & Plan   Patient is a 42 year old with history of GERD admitted earlier with pneumonia now having worsening respiratory failure  1.  Acute respiratory failure due to pneumonia I will obtain another stat chest x-ray to rule out any progression or any other abnormality such as fluid overload I will obtain ABG Continue antibiotics with ceftriaxone azithromycin I have discussed the case with the ICU attending patient will be transferred to the ICU may need intubation      Code Status Orders  (From admission, onward)         Start     Ordered   05/19/18 1526  Full code  Continuous     05/19/18 1525        Code Status History    This patient has a current code status but no historical code status.    Advance Directive Documentation     Most Recent Value  Type of Advance Directive  Healthcare Power of Attorney  Pre-existing out of facility DNR order (yellow form or pink MOST form)  -  "MOST" Form in Place?  -           Consults  Critical care  DVT Prophylaxis  Lovenox   Lab Results  Component Value Date   PLT 141 (L) 05/19/2018     Time Spent in minutes    critical care Greater than 50% of time spent in care coordination and counseling patient regarding the condition and plan of care.   Auburn Bilberry M.D on 05/19/2018 at 3:48 PM  Between 7am to 6pm - Pager - [phone]  After 6pm go to [email or web address] - Social research officer, government  [employer]   Office  [phone]

## 2018-05-19 NOTE — ED Notes (Signed)
Patient to Rm 8 via WC, Kim RN made aware of room placement, need for bloodwork and Oxygen sat.

## 2018-05-19 NOTE — ED Notes (Signed)
Report to Candy, prepared for transfer to floor.

## 2018-05-19 NOTE — Progress Notes (Addendum)
Patient received from ED around 1520 in respiratory distress. Patient was tachypneic at 34, febrile, sats were in the 80's on 6L via [place]. Patient reported feeling short of breath, was using accessory muscles and gasping when trying to talk. Lung sounds diminished with poor air movement. Patient placed on NRB. Sats improved to 90%. Contacted respiratory for eval. Dr Allena KatzPatel notified and he ordered an ABG and states he is en route to see patient. Dr patel arrived at bedside and ordered for patient to be transferred to ICU. ABG drawn. Contacted ICU charge nurse stacy to make aware of need for a bed. Bed given. Patient transferred. Wife at bedside and accompanied patient to ICU where she was instructed to wait in waiting room. Report given at bedside to [facility]arah.

## 2018-05-19 NOTE — ED Triage Notes (Signed)
Patient reports cough, congestion and fever since Sunday. Also complaining of body aches. Patient states he was unable to get prescription filled and states has been taking mucinex at home without relief.

## 2018-05-19 NOTE — Progress Notes (Signed)
Pharmacy Antibiotic Note  Brett [name] is a 42 y.o. male admitted on 05/19/2018 with pneumonia.  Patient admitted through the ED and initially transferred to the floor on azithromycin and ceftriaxone. Patients condition further declined and patient transferred to the ICU. Pharmacy has been consulted for vancomycin and cefepime dosing. Patient is to remain on azithromycin 500mg  IV Q24hr.   Plan: Cefepime 1g IV Q8hr.   Vancomycin 1g IV x 1. Will continue vancomycin 1250mg  IV Q12hr for goal trough of 15-20. Will follow renal function daily while on vancomycin and obtain trough as clinically indicated.   HIV antibody, Legionella, and Strep Urine antigen are pending collection.   Height: 6' (182.9 cm) Weight: 207 lb 0.2 oz (93.9 kg) IBW/kg (Calculated) : 77.6  Temp (24hrs), Avg:100.2 F (37.9 C), Min:98.7 F (37.1 C), Max:102.5 F (39.2 C)  Recent Labs  Lab 05/19/18 0819 05/19/18 0836 05/19/18 1111  WBC 13.5*  --   --   CREATININE 1.37*  --   --   LATICACIDVEN  --  2.5* 1.3    Estimated Creatinine Clearance: 84.4 mL/min (A) (by C-G formula based on SCr of 1.37 mg/dL (H)).    Allergies  Allergen Reactions  . Pantoprazole Hives  . Penicillins Hives    Antimicrobials this admission: Ceftriaxone 12/13 x 1  Azithromycin 12/31  >>  Cefepime 12/31 >>  Vancomycin 12/31 >>  Dose adjustments this admission: N/A  Microbiology results: 12/31 BCx: pending  12/31 UCx: pending  12/31 Sputum: pending  12/31 MRSA PCR: pending   Thank you for allowing pharmacy to be a part of this patient's care.  Simpson,Michael L 05/19/2018 5:11 PM

## 2018-05-19 NOTE — ED Notes (Signed)
SAT ddropped to 81% on room air, 02 applied 3l with SAT increase to 97.

## 2018-05-19 NOTE — ED Provider Notes (Signed)
[facility] Emergency Department Provider Note    None    (approximate)  I have reviewed the triage vital signs and the nursing notes.   HISTORY  Chief Complaint Cough and Shortness of Breath    HPI Brett [name] is a 42 y.o. male presents to the ER for chief complaint of shortness of breath and productive cough since this weekend.  Was seen in the ER yesterday and treated for viral upper respiratory infection.  Patient states he is having trouble catching his breath.  Does endorse right-sided abdominal pain secondary to frequent coughing.  Feels weak and lightheaded.  Did take some Tylenol prior to arrival for fever and muscle aches.  No recent antibiotic use.    Past Medical History:  Diagnosis Date  . Acid reflux   . Speech impediment    Family History  Problem Relation Age of Onset  . Healthy Mother   . Pneumonia Father   . Pneumonia Sister   . Pneumonia Brother   . Diabetes Maternal Grandmother   . Pneumonia Sister    Past Surgical History:  Procedure Laterality Date  . TONSILLECTOMY     Patient Active Problem List   Diagnosis Date Noted  . Tremor 05/07/2013      Prior to Admission medications   Medication Sig Start Date End Date Taking? Authorizing Provider  diphenoxylate-atropine (LOMOTIL) 2.5-0.025 MG tablet Take 1 tablet by mouth 4 (four) times daily as needed for diarrhea or loose stools. 01/21/18   Tommie Sams, DO  esomeprazole (NEXIUM) 40 MG packet Take 40 mg by mouth daily before breakfast. 01/22/18   Irean Hong, MD  guaiFENesin-codeine 100-10 MG/5ML syrup Take 5 mLs by mouth every 6 (six) hours as needed for cough. 05/17/18   Tommi Rumps, PA-C    Allergies Pantoprazole and Penicillins    Social History Social History   Tobacco Use  . Smoking status: Never Smoker  . Smokeless tobacco: Never Used  Substance Use Topics  . Alcohol use: Yes    Comment: occasionally   . Drug use: Never    Review of  Systems Patient denies headaches, rhinorrhea, blurry vision, numbness, shortness of breath, chest pain, edema, cough, abdominal pain, nausea, vomiting, diarrhea, dysuria, fevers, rashes or hallucinations unless otherwise stated above in HPI. ____________________________________________   PHYSICAL EXAM:  VITAL SIGNS: Vitals:   05/19/18 0914 05/19/18 0951  BP: 120/71 138/84  Pulse: (!) 106 (!) 113  Resp: (!) 36 (!) 32  Temp:    SpO2: 95% 97%    Constitutional: Alert and oriented. Ill appearing, mild respiratory distress Eyes: Conjunctivae are normal.  Head: Atraumatic. Nose: No congestion/rhinnorhea. Mouth/Throat: Mucous membranes are moist.   Neck: No stridor. Painless ROM.  Cardiovascular: tachycardic rate, regular rhythm. Grossly normal heart sounds.  Good peripheral circulation. Respiratory: tachypnea to the 40's, rhonchorus bs in posterior bases with inspiratory crackles Gastrointestinal: Soft and nontender. No distention. No abdominal bruits. No CVA tenderness. Genitourinary: deferred Musculoskeletal: No lower extremity tenderness nor edema.  No joint effusions. Neurologic:  Normal speech and language. No gross focal neurologic deficits are appreciated. No facial droop Skin:  Skin is warm, dry and intact. No rash noted. Psychiatric: Mood and affect are normal. Speech and behavior are normal.  ____________________________________________   LABS (all labs ordered are listed, but only abnormal results are displayed)  Results for orders placed or performed during the hospital encounter of 05/19/18 (from the past 24 hour(s))  Comprehensive metabolic panel  Status: Abnormal   Collection Time: 05/19/18  8:19 AM  Result Value Ref Range   Sodium 137 135 - 145 mmol/L   Potassium 4.0 3.5 - 5.1 mmol/L   Chloride 101 98 - 111 mmol/L   CO2 26 22 - 32 mmol/L   Glucose, Bld 211 (H) 70 - 99 mg/dL   BUN 17 6 - 20 mg/dL   Creatinine, Ser 9.141.37 (H) 0.61 - 1.24 mg/dL   Calcium 9.0  8.9 - 78.210.3 mg/dL   Total Protein 7.3 6.5 - 8.1 g/dL   Albumin 3.9 3.5 - 5.0 g/dL   AST 32 15 - 41 U/L   ALT 47 (H) 0 - 44 U/L   Alkaline Phosphatase 69 38 - 126 U/L   Total Bilirubin 1.5 (H) 0.3 - 1.2 mg/dL   GFR calc non Af Amer >60 >60 mL/min   GFR calc Af Amer >60 >60 mL/min   Anion gap 10 5 - 15  CBC with Differential     Status: Abnormal   Collection Time: 05/19/18  8:19 AM  Result Value Ref Range   WBC 13.5 (H) 4.0 - 10.5 K/uL   RBC 5.12 4.22 - 5.81 MIL/uL   Hemoglobin 15.6 13.0 - 17.0 g/dL   HCT 95.647.8 21.339.0 - 08.652.0 %   MCV 93.4 80.0 - 100.0 fL   MCH 30.5 26.0 - 34.0 pg   MCHC 32.6 30.0 - 36.0 g/dL   RDW 57.812.5 46.911.5 - 62.915.5 %   Platelets 141 (L) 150 - 400 K/uL   nRBC 0.0 0.0 - 0.2 %   Neutrophils Relative % 90 %   Neutro Abs 12.0 (H) 1.7 - 7.7 K/uL   Lymphocytes Relative 4 %   Lymphs Abs 0.6 (L) 0.7 - 4.0 K/uL   Monocytes Relative 6 %   Monocytes Absolute 0.8 0.1 - 1.0 K/uL   Eosinophils Relative 0 %   Eosinophils Absolute 0.0 0.0 - 0.5 K/uL   Basophils Relative 0 %   Basophils Absolute 0.0 0.0 - 0.1 K/uL   Immature Granulocytes 0 %   Abs Immature Granulocytes 0.05 0.00 - 0.07 K/uL  Troponin I - ONCE - STAT     Status: Abnormal   Collection Time: 05/19/18  8:19 AM  Result Value Ref Range   Troponin I 0.03 (HH) <0.03 ng/mL  Brain natriuretic peptide     Status: None   Collection Time: 05/19/18  8:19 AM  Result Value Ref Range   B Natriuretic Peptide 83.0 0.0 - 100.0 pg/mL  Lactic acid, plasma     Status: Abnormal   Collection Time: 05/19/18  8:36 AM  Result Value Ref Range   Lactic Acid, Venous 2.5 (HH) 0.5 - 1.9 mmol/L   ____________________________________________  EKG My review and personal interpretation at Time: 8:23   Indication: tachycardia  Rate: 110  Rhythm: sinus Axis: normal Other: normal intervals, no stemi ____________________________________________  RADIOLOGY  I personally reviewed all radiographic images ordered to evaluate for the above acute  complaints and reviewed radiology reports and findings.  These findings were personally discussed with the patient.  Please see medical record for radiology report.  ____________________________________________   PROCEDURES  Procedure(s) performed:  .Critical Care Performed by: Willy Eddyobinson, Dorette Hartel, MD Authorized by: Willy Eddyobinson, Jackson Coffield, MD   Critical care provider statement:    Critical care time was exclusive of:  Separately billable procedures and treating other patients   Critical care was necessary to treat or prevent imminent or life-threatening deterioration of the following conditions:  Sepsis  and respiratory failure   Critical care was time spent personally by me on the following activities:  Development of treatment plan with patient or surrogate, discussions with consultants, evaluation of patient's response to treatment, examination of patient, obtaining history from patient or surrogate, ordering and performing treatments and interventions, ordering and review of laboratory studies, ordering and review of radiographic studies, pulse oximetry, re-evaluation of patient's condition and review of old charts      Critical Care performed: yes ____________________________________________   INITIAL IMPRESSION / ASSESSMENT AND PLAN / ED COURSE  Pertinent labs & imaging results that were available during my care of the patient were reviewed by me and considered in my medical decision making (see chart for details).   DDX: sepsis, pna, bronchitis, chf,  Vickki MuffDevon Bishop is a 42 y.o. who presents to the ED with symptoms as described above.  Patient febrile tachycardic with mild respiratory distress requiring supplemental oxygen.  Has rhonchorous breath sounds certainly concerning for pneumonia.  Chest x-ray shows evidence of multilobar pneumonia.  Less likely CHF given fever and recent productive cough.  No history of CHF.  Doubt PE.  Will provide IV fluids.  Will send blood work for above  differential.  Will start on antibiotics for community-acquired pneumonia.  Will give nebulizer.  Clinical Course as of May 19 952  Tue May 19, 2018  [id number] Patient becoming increasingly short of breath.  Desaturated down to 81% on room air just resting.  Based on his sepsis with acute respiratory failure with hypoxia requiring supplemental oxygen likely secondary to multilobar pneumonia I do feel patient will require hospitalization for further medical management.   [PR]    Clinical Course User Index [PR] Willy Eddyobinson, Nakyra Bourn, MD     As part of my medical decision making, I reviewed the following data within the electronic MEDICAL RECORD NUMBER Nursing notes reviewed and incorporated, Labs reviewed, notes from prior ED visits and [place] Controlled Substance Database   ____________________________________________   FINAL CLINICAL IMPRESSION(S) / ED DIAGNOSES  Final diagnoses:  Sepsis with acute hypoxic respiratory failure without septic shock, due to unspecified organism [facility](HCC)      NEW MEDICATIONS STARTED DURING THIS VISIT:  New Prescriptions   No medications on file     Note:  This document was prepared using Dragon voice recognition software and may include unintentional dictation errors.    Willy Eddyobinson, Jamiah Homeyer, MD 05/19/18 [phone]

## 2018-05-19 NOTE — ED Notes (Signed)
ED TO INPATIENT HANDOFF REPORT  Name/Age/Gender Brett [name] 42 y.o. male  Code Status Advance Directive Documentation     Most Recent Value  Type of Advance Directive  Healthcare Power of Attorney  Pre-existing out of facility DNR order (yellow form or pink MOST form)  -  "MOST" Form in Place?  -      Home/SNF/Other Home  Chief Complaint uri  Level of Care/Admitting Diagnosis ED Disposition    ED Disposition Condition Comment   Admit  The patient appears reasonably stabilized for admission considering the current resources, flow, and capabilities available in the ED at this time, and I doubt any other [facility] requiring further screening and/or treatment in the ED prior to admission is  present.       Medical History Past Medical History:  Diagnosis Date  . Acid reflux   . Speech impediment     Allergies Allergies  Allergen Reactions  . Pantoprazole Hives  . Penicillins Hives    IV Location/Drains/Wounds Patient Lines/Drains/Airways Status   Active Line/Drains/Airways    Name:   Placement date:   Placement time:   Site:   Days:   Peripheral IV 05/19/18 Right Antecubital   05/19/18    0847    Antecubital   less than 1   Peripheral IV 05/19/18 Antecubital   05/19/18    0847    Antecubital   less than 1          Labs/Imaging Results for orders placed or performed during the hospital encounter of 05/19/18 (from the past 48 hour(s))  Comprehensive metabolic panel     Status: Abnormal   Collection Time: 05/19/18  8:19 AM  Result Value Ref Range   Sodium 137 135 - 145 mmol/L   Potassium 4.0 3.5 - 5.1 mmol/L   Chloride 101 98 - 111 mmol/L   CO2 26 22 - 32 mmol/L   Glucose, Bld 211 (H) 70 - 99 mg/dL   BUN 17 6 - 20 mg/dL   Creatinine, Ser 9.601.37 (H) 0.61 - 1.24 mg/dL   Calcium 9.0 8.9 - 45.410.3 mg/dL   Total Protein 7.3 6.5 - 8.1 g/dL   Albumin 3.9 3.5 - 5.0 g/dL   AST 32 15 - 41 U/L   ALT 47 (H) 0 - 44 U/L   Alkaline Phosphatase 69 38 - 126 U/L   Total  Bilirubin 1.5 (H) 0.3 - 1.2 mg/dL   GFR calc non Af Amer >60 >60 mL/min   GFR calc Af Amer >60 >60 mL/min   Anion gap 10 5 - 15    Comment: Performed at [facility], [address] [place], [place] [address]  CBC with Differential     Status: Abnormal   Collection Time: 05/19/18  8:19 AM  Result Value Ref Range   WBC 13.5 (H) 4.0 - 10.5 K/uL   RBC 5.12 4.22 - 5.81 MIL/uL   Hemoglobin 15.6 13.0 - 17.0 g/dL   HCT 91.447.8 78.239.0 - 95.652.0 %   MCV 93.4 80.0 - 100.0 fL   MCH 30.5 26.0 - 34.0 pg   MCHC 32.6 30.0 - 36.0 g/dL   RDW 21.312.5 08.611.5 - 57.815.5 %   Platelets 141 (L) 150 - 400 K/uL   nRBC 0.0 0.0 - 0.2 %   Neutrophils Relative % 90 %   Neutro Abs 12.0 (H) 1.7 - 7.7 K/uL   Lymphocytes Relative 4 %   Lymphs Abs 0.6 (L) 0.7 - 4.0 K/uL   Monocytes Relative 6 %  Monocytes Absolute 0.8 0.1 - 1.0 K/uL   Eosinophils Relative 0 %   Eosinophils Absolute 0.0 0.0 - 0.5 K/uL   Basophils Relative 0 %   Basophils Absolute 0.0 0.0 - 0.1 K/uL   Immature Granulocytes 0 %   Abs Immature Granulocytes 0.05 0.00 - 0.07 K/uL    Comment: Performed at [facility], [address] [place], [place] [address]  Troponin I - ONCE - STAT     Status: Abnormal   Collection Time: 05/19/18  8:19 AM  Result Value Ref Range   Troponin I 0.03 (HH) <0.03 ng/mL    Comment: CRITICAL RESULT CALLED TO, READ BACK BY AND VERIFIED WITH KIM Carrel Leather AT 6045 ON 05/19/18 [facility]. Performed at [facility], [address] [place], [place] [address]   Brain natriuretic peptide     Status: None   Collection Time: 05/19/18  8:19 AM  Result Value Ref Range   B Natriuretic Peptide 83.0 0.0 - 100.0 pg/mL    Comment: Performed at [facility], [address] [place], [place] [address]  Lactic acid, plasma     Status: Abnormal   Collection Time: 05/19/18  8:36 AM  Result Value Ref Range   Lactic Acid, Venous 2.5 (HH) 0.5 - 1.9 mmol/L    Comment: CRITICAL RESULT CALLED TO, READ BACK BY AND VERIFIED  WITH KIM Sharnese Heath AT 0909 ON 05/19/2018 [facility] Performed at [facility], [address] [place] [place], [place] [address]    Dg Chest 2 View  Result Date: 05/19/2018 CLINICAL DATA:  Cough and fever; shortness of breath EXAM: CHEST - 2 VIEW COMPARISON:  January 22, 2018 FINDINGS: There is patchy airspace opacity in the lung bases, slightly more on the left than on the right. Lungs elsewhere are clear. Heart is borderline enlarged with slight pulmonary venous hypertension. No adenopathy. No bone lesions. IMPRESSION: Patchy infiltrate in the bases, felt to represent pneumonia. Pulmonary vascular congestion. Electronically Signed   By: Bretta Bang III M.D.   On: 05/19/2018 08:19    Pending Labs Unresulted Labs (From admission, onward)    Start     Ordered   05/19/18 0831  Blood Culture (routine x 2)  BLOOD CULTURE X 2,   STAT     05/19/18 0830   05/19/18 0831  Urine culture  ONCE - STAT,   STAT     05/19/18 0830          Vitals/Pain Today's Vitals   05/19/18 0801 05/19/18 0802 05/19/18 0914 05/19/18 0951  BP:   120/71 138/84  Pulse: (!) 108  (!) 106 (!) 113  Resp: (!) 24  (!) 36 (!) 32  Temp: (!) 100.7 F (38.2 C)     TempSrc: Oral     SpO2: 91%  95% 97%  Weight:  93.9 kg    Height:  6' (1.829 m)    PainSc: 10-Worst pain ever   10-Worst pain ever    Isolation Precautions No active isolations  Medications Medications  cefTRIAXone (ROCEPHIN) 2 g in sodium chloride 0.9 % 100 mL IVPB (2 g Intravenous New Bag/Given 05/19/18 0903)  azithromycin (ZITHROMAX) 500 mg in sodium chloride 0.9 % 250 mL IVPB (500 mg Intravenous New Bag/Given 05/19/18 0900)  ketorolac (TORADOL) 30 MG/ML injection 15 mg (has no administration in time range)  sodium chloride 0.9 % bolus 1,000 mL (1,000 mLs Intravenous New Bag/Given 05/19/18 0854)  morphine 4 MG/ML injection 4 mg (4 mg Intravenous Given 05/19/18 0903)  ondansetron (  ZOFRAN) injection 4 mg (4 mg Intravenous Given 05/19/18 0902)   ipratropium-albuterol (DUONEB) 0.5-2.5 (3) MG/3ML nebulizer solution 3 mL (3 mLs Nebulization Given 05/19/18 0912)    Mobility ambulatory

## 2018-05-20 ENCOUNTER — Inpatient Hospital Stay: Payer: BLUE CROSS/BLUE SHIELD

## 2018-05-20 LAB — BRAIN NATRIURETIC PEPTIDE: B Natriuretic Peptide: 90 pg/mL (ref 0.0–100.0)

## 2018-05-20 LAB — CBC
HCT: 39.8 % (ref 39.0–52.0)
Hemoglobin: 12.8 g/dL — ABNORMAL LOW (ref 13.0–17.0)
MCH: 30 pg (ref 26.0–34.0)
MCHC: 32.2 g/dL (ref 30.0–36.0)
MCV: 93.4 fL (ref 80.0–100.0)
Platelets: 127 10*3/uL — ABNORMAL LOW (ref 150–400)
RBC: 4.26 MIL/uL (ref 4.22–5.81)
RDW: 12.7 % (ref 11.5–15.5)
WBC: 8.8 10*3/uL (ref 4.0–10.5)
nRBC: 0 % (ref 0.0–0.2)

## 2018-05-20 LAB — BASIC METABOLIC PANEL
Anion gap: 7 (ref 5–15)
BUN: 14 mg/dL (ref 6–20)
CO2: 24 mmol/L (ref 22–32)
Calcium: 7.9 mg/dL — ABNORMAL LOW (ref 8.9–10.3)
Chloride: 106 mmol/L (ref 98–111)
Creatinine, Ser: 1.01 mg/dL (ref 0.61–1.24)
GFR calc Af Amer: >60 mL/min (ref >60)
GFR calc non Af Amer: >60 mL/min (ref >60)
Glucose, Bld: 131 mg/dL — ABNORMAL HIGH (ref 70–99)
POTASSIUM: 3.3 mmol/L — AB (ref 3.5–5.1)
SODIUM: 137 mmol/L (ref 135–145)

## 2018-05-20 LAB — HIV ANTIBODY (ROUTINE TESTING W REFLEX): HIV Screen 4th Generation wRfx: NONREACTIVE

## 2018-05-20 MED ORDER — FUROSEMIDE 10 MG/ML IJ SOLN
20.0000 mg | Freq: Once | INTRAMUSCULAR | Status: AC
  Administered 2018-05-20: 20 mg via INTRAVENOUS
  Filled 2018-05-20: qty 2

## 2018-05-20 MED ORDER — IPRATROPIUM-ALBUTEROL 0.5-2.5 (3) MG/3ML IN SOLN
3.0000 mL | RESPIRATORY_TRACT | Status: DC | PRN
  Administered 2018-05-21: 3 mL via RESPIRATORY_TRACT
  Filled 2018-05-20: qty 3

## 2018-05-20 MED ORDER — POTASSIUM CHLORIDE CRYS ER 20 MEQ PO TBCR
40.0000 meq | EXTENDED_RELEASE_TABLET | Freq: Once | ORAL | Status: AC
  Administered 2018-05-20: 40 meq via ORAL
  Filled 2018-05-20: qty 2

## 2018-05-20 NOTE — Progress Notes (Signed)
Increazsed flow to 35lpm to better meet patient demands

## 2018-05-20 NOTE — Progress Notes (Signed)
Follow up - Critical Care Medicine Note  Patient Details:    Brett [name] is an 43 y.o. male.with a past medical history remarkable for acid reflux and speech impediment presented to the emergency room complaining of cough, increasing shortness of breath, fever and sputum production.  Was seen in emergency department initially had a negative flu test and was subsequently discharged home.  Represented with progressive increasing shortness of breath and admitted to the floor for community-acquired pneumonia.  Initially started on azithromycin and Rocephin.  Has been developing worsening hypoxemic respiratory failure and has been subsequently transferred to the intensive care unit.    Lines, Airways, Drains:    Anti-infectives:  Anti-infectives (From admission, onward)   Start     Dose/Rate Route Frequency Ordered Stop   05/20/18 1000  azithromycin (ZITHROMAX) 500 mg in sodium chloride 0.9 % 250 mL IVPB     500 mg 250 mL/hr over 60 Minutes Intravenous Every 24 hours 05/19/18 1728     05/19/18 2300  vancomycin (VANCOCIN) 1,250 mg in sodium chloride 0.9 % 250 mL IVPB     1,250 mg 166.7 mL/hr over 90 Minutes Intravenous Every 12 hours 05/19/18 1706     05/19/18 1630  ceFEPIme (MAXIPIME) 1 g in sodium chloride 0.9 % 100 mL IVPB     1 g 200 mL/hr over 30 Minutes Intravenous Every 8 hours 05/19/18 1623     05/19/18 1630  vancomycin (VANCOCIN) IVPB 1000 mg/200 mL premix     1,000 mg 200 mL/hr over 60 Minutes Intravenous  Once 05/19/18 1623 05/19/18 1836   05/19/18 0845  cefTRIAXone (ROCEPHIN) 2 g in sodium chloride 0.9 % 100 mL IVPB  Status:  Discontinued     2 g 200 mL/hr over 30 Minutes Intravenous Every 24 hours 05/19/18 0830 05/19/18 1608   05/19/18 0845  azithromycin (ZITHROMAX) 500 mg in sodium chloride 0.9 % 250 mL IVPB  Status:  Discontinued     500 mg 250 mL/hr over 60 Minutes Intravenous Every 24 hours 05/19/18 0830 05/19/18 1728      Microbiology: Results for orders placed or  performed during the hospital encounter of 05/19/18  Blood Culture (routine x 2)     Status: None (Preliminary result)   Collection Time: 05/19/18  8:36 AM  Result Value Ref Range Status   Specimen Description BLOOD RIGHT ANTECUBITAL  Final   Special Requests   Final    BOTTLES DRAWN AEROBIC AND ANAEROBIC Blood Culture adequate volume   Culture   Final    NO GROWTH < 24 HOURS Performed at [facility], [address] [place], [place] [address]    Report Status PENDING  Incomplete  Blood Culture (routine x 2)     Status: None (Preliminary result)   Collection Time: 05/19/18  8:39 AM  Result Value Ref Range Status   Specimen Description BLOOD LEFT AC  Final   Special Requests   Final    BOTTLES DRAWN AEROBIC AND ANAEROBIC Blood Culture results may not be optimal due to an inadequate volume of blood received in culture bottles   Culture   Final    NO GROWTH < 24 HOURS Performed at [facility], [address] [place], [place] [address]    Report Status PENDING  Incomplete  MRSA PCR Screening     Status: None   Collection Time: 05/19/18  4:18 PM  Result Value Ref Range Status   MRSA by PCR NEGATIVE NEGATIVE Final    Comment:  The GeneXpert MRSA Assay (FDA approved for NASAL specimens only), is one component of a comprehensive MRSA colonization surveillance program. It is not intended to diagnose MRSA infection nor to guide or monitor treatment for MRSA infections. Performed at [facility], [address] [place], [place] [address]   Culture, sputum-assessment     Status: None   Collection Time: 05/19/18  7:26 PM  Result Value Ref Range Status   Specimen Description SPUTUM  Final   Special Requests NONE  Final   Sputum evaluation   Final    THIS SPECIMEN IS ACCEPTABLE FOR SPUTUM CULTURE Performed at [facility], [address] [place], [place] [address]    Report Status 05/19/2018 FINAL  Final  Culture, respiratory      Status: None (Preliminary result)   Collection Time: 05/19/18  7:26 PM  Result Value Ref Range Status   Specimen Description   Final    SPUTUM Performed at [facility], [address] [place], [place] [address]    Special Requests   Final    NONE Reflexed from [id number] Performed at [facility], [address] [place], [place] [address]    Gram Stain   Final    ABUNDANT WBC PRESENT,BOTH PMN AND MONONUCLEAR RARE GRAM POSITIVE COCCI Performed at [facility], [address] [place], [place] [address]    Culture PENDING  Incomplete   Report Status PENDING  Incomplete    Studies: Dg Chest 2 View  Result Date: 05/19/2018 CLINICAL DATA:  Cough and fever; shortness of breath EXAM: CHEST - 2 VIEW COMPARISON:  January 22, 2018 FINDINGS: There is patchy airspace opacity in the lung bases, slightly more on the left than on the right. Lungs elsewhere are clear. Heart is borderline enlarged with slight pulmonary venous hypertension. No adenopathy. No bone lesions. IMPRESSION: Patchy infiltrate in the bases, felt to represent pneumonia. Pulmonary vascular congestion. Electronically Signed   By: Bretta BangWilliam  Woodruff III M.D.   On: 05/19/2018 08:19   Dg Chest Port 1 View  Result Date: 05/20/2018 CLINICAL DATA:  Respiratory failure EXAM: PORTABLE CHEST 1 VIEW COMPARISON:  05/19/2018 FINDINGS: Moderate cardiomegaly with right-greater-than-left basilar predominant airspace opacity and right pleural effusion. Pulmonary edema suspected. IMPRESSION: Cardiomegaly, right pleural effusion and likely pulmonary edema, suggesting congestive heart failure. Electronically Signed   By: Deatra RobinsonKevin  Herman M.D.   On: 05/20/2018 04:49   Dg Chest Port 1 View  Result Date: 05/19/2018 CLINICAL DATA:  Onset of respiratory failure 1 hour prior to presentation. Patient has been transferred to the ICU. EXAM: PORTABLE CHEST 1 VIEW COMPARISON:  PA and lateral chest x-ray of May 19, 2018 FINDINGS:  There is increased density at the right lung base as compared to the earlier study today. There is patchy increased density lateral to the left heart border which is new as well. The upper lobes are clear. The cardiac silhouette remains enlarged. The central pulmonary vascularity is somewhat more engorged. IMPRESSION: Increased density at the right lung base and in the left mid and lower lung may reflect atelectasis or worsening of pneumonia given the sudden change since this morning. No overt pulmonary edema although there is mild central pulmonary vascular prominence and mild cardiomegaly. Electronically Signed   By: David  SwazilandJordan M.D.   On: 05/19/2018 16:32    Consults:    Subjective:    Overnight Issues: Patient states overnight he has had improvement in his respiratory status.  States his cough is productive. Still  on 85% heated high flow  Objective:  Vital signs for last 24 hours: Temp:  [98.3 F (36.8 C)-102.5 F (39.2 C)] 98.3 F (36.8 C) (01/01 0400) Pulse Rate:  [79-116] 89 (01/01 0600) Resp:  [17-36] 26 (01/01 0600) BP: (97-138)/(53-84) 97/53 (01/01 0600) SpO2:  [84 %-98 %] 91 % (01/01 0600) FiO2 (%):  [85 %-100 %] 85 % (01/01 0331) Weight:  [93.9 kg] 93.9 kg (12/31 0802)  Hemodynamic parameters for last 24 hours:    Intake/Output from previous day: 12/31 0701 - 01/01 0700 In: 3666.9 [P.O.:120; I.V.:1864.5; IV Piggyback:1682.4] Out: 400 [Urine:400]  Intake/Output this shift: No intake/output data recorded.  Vent settings for last 24 hours: FiO2 (%):  [85 %-100 %] 85 %  Physical Exam:  Patient is awake, alert, in respiratory distress on nonrebreather mask.  Is saturating at 95% on the monitor Vital signs:       Please see the above listed vital signs HEENT:           Trachea midline, no thyromegaly appreciated, no oral lesions appreciated, no jugular venous distention, accessory muscle utilization noted Cardiovascular:           Tachycardia sinus mechanism noted on  monitor Pulmonary:      Crackles appreciated diffusely Abdominal:      Positive bowel sounds, soft exam Extremities:     No clubbing, cyanosis or edema noted Neurologic:      Cranial nerves are grossly intact without any focal deficits appreciated Cutaneous:      No rashes or lesions noted  Assessment/Plan:  Respiratory distress.  Patient with pneumonia, febrile, white count, sputum production, recent sick contacts with progressive changes on chest x-ray.  On heated high flow, broaden antibiotic coverage to include vancomycin, cefepime and azithromycin.  Pending urine for Legionella, urine pneumococcus negative, sputum specimen for Gram stain and C and S.  Wean Fi02  Elevated lactic acid at 2.5 secondary to sepsis  Hypokalemia.  Will replace  Minimal elevation in troponin at 0.03, EKG does not reveal any ischemic changes  Tora KindredJohn Kemani Heidel, DO  [facility] 05/20/2018  *Care during the described time interval was provided by me and/or other providers on the critical care team.  I have reviewed this patient's available data, including medical history, events of note, physical examination and test results as part of my evaluation.

## 2018-05-20 NOTE — Progress Notes (Signed)
Decreased fio2 to 85%.

## 2018-05-20 NOTE — Progress Notes (Signed)
[employer] - [place] at [facility]                                                                                                                                                                                  Patient Demographics   Brett [name], is a 43 y.o. male, DOB - 1976-01-07, ZOX:[id number]  Admit date - 05/19/2018   Admitting Physician Auburn Bilberry, MD  Outpatient Primary MD for the patient is Patient, No Pcp Per   LOS - 1  Subjective: Patient this morning was on high flow oxygen. Continues to be short of breath   Review of Systems:   CONSTITUTIONAL: No documented fever. No fatigue, weakness. No weight gain, no weight loss.  EYES: No blurry or double vision.  ENT: No tinnitus. No postnasal drip. No redness of the oropharynx.  RESPIRATORY: Positive cough, positive wheeze, no hemoptysis.  Positive dyspnea.  CARDIOVASCULAR: No chest pain. No orthopnea. No palpitations. No syncope.  GASTROINTESTINAL: No nausea, no vomiting or diarrhea. No abdominal pain. No melena or hematochezia.  GENITOURINARY: No dysuria or hematuria.  ENDOCRINE: No polyuria or nocturia. No heat or cold intolerance.  HEMATOLOGY: No anemia. No bruising. No bleeding.  INTEGUMENTARY: No rashes. No lesions.  MUSCULOSKELETAL: No arthritis. No swelling. No gout.  NEUROLOGIC: No numbness, tingling, or ataxia. No seizure-type activity.  PSYCHIATRIC: No anxiety. No insomnia. No ADD.    Vitals:   Vitals:   05/20/18 1200 05/20/18 1300 05/20/18 1400 05/20/18 1500  BP: (!) 103/51 (!) 105/56 122/79 120/87  Pulse: (!) 113 (!) 105 93 90  Resp: (!) 28 (!) 29 (!) 24 (!) 30  Temp: 98.8 F (37.1 C)     TempSrc: Oral     SpO2: 94% 93% 91% 91%  Weight:      Height:        Wt Readings from Last 3 Encounters:  05/19/18 93.9 kg  05/17/18 93.9 kg  01/21/18 95.7 kg     Intake/Output Summary (Last 24 hours) at 05/20/2018 1622 Last data filed at 05/20/2018 1500 Gross per 24 hour  Intake 2134.52 ml   Output 1975 ml  Net 159.52 ml    Physical Exam:   GENERAL: Pleasant-appearing in no apparent distress.  HEAD, EYES, EARS, NOSE AND THROAT: Atraumatic, normocephalic. Extraocular muscles are intact. Pupils equal and reactive to light. Sclerae anicteric. No conjunctival injection. No oro-pharyngeal erythema.  NECK: Supple. There is no jugular venous distention. No bruits, no lymphadenopathy, no thyromegaly.  HEART: Regular rate and rhythm,. No murmurs, no rubs, no clicks.  LUNGS: Rhonchus breath sounds bilaterally  aBDOMEN: Soft, flat, nontender, nondistended. Has good bowel sounds. No hepatosplenomegaly appreciated.  EXTREMITIES: No evidence  of any cyanosis, clubbing, or peripheral edema.  +2 pedal and radial pulses bilaterally.  NEUROLOGIC: The patient is alert, awake, and oriented x3 with no focal motor or sensory deficits appreciated bilaterally.  SKIN: Moist and warm with no rashes appreciated.  Psych: Not anxious, depressed LN: No inguinal LN enlargement    Antibiotics   Anti-infectives (From admission, onward)   Start     Dose/Rate Route Frequency Ordered Stop   05/20/18 1000  azithromycin (ZITHROMAX) 500 mg in sodium chloride 0.9 % 250 mL IVPB     500 mg 250 mL/hr over 60 Minutes Intravenous Every 24 hours 05/19/18 1728     05/19/18 2300  vancomycin (VANCOCIN) 1,250 mg in sodium chloride 0.9 % 250 mL IVPB     1,250 mg 166.7 mL/hr over 90 Minutes Intravenous Every 12 hours 05/19/18 1706     05/19/18 1630  ceFEPIme (MAXIPIME) 1 g in sodium chloride 0.9 % 100 mL IVPB     1 g 200 mL/hr over 30 Minutes Intravenous Every 8 hours 05/19/18 1623     05/19/18 1630  vancomycin (VANCOCIN) IVPB 1000 mg/200 mL premix     1,000 mg 200 mL/hr over 60 Minutes Intravenous  Once 05/19/18 1623 05/19/18 1836   05/19/18 0845  cefTRIAXone (ROCEPHIN) 2 g in sodium chloride 0.9 % 100 mL IVPB  Status:  Discontinued     2 g 200 mL/hr over 30 Minutes Intravenous Every 24 hours 05/19/18 0830 05/19/18  1608   05/19/18 0845  azithromycin (ZITHROMAX) 500 mg in sodium chloride 0.9 % 250 mL IVPB  Status:  Discontinued     500 mg 250 mL/hr over 60 Minutes Intravenous Every 24 hours 05/19/18 0830 05/19/18 1728      Medications   Scheduled Meds: . enoxaparin (LOVENOX) injection  40 mg Subcutaneous Q24H  . famotidine  20 mg Oral QAC breakfast  . guaiFENesin  600 mg Oral BID  . ipratropium-albuterol  3 mL Nebulization Q4H   Continuous Infusions: . azithromycin Stopped (05/20/18 1154)  . ceFEPime (MAXIPIME) IV Stopped (05/20/18 1450)  . vancomycin Stopped (05/20/18 1416)   PRN Meds:.acetaminophen **OR** acetaminophen, guaiFENesin-codeine, ibuprofen, ipratropium-albuterol, morphine injection, ondansetron **OR** ondansetron (ZOFRAN) IV, oxyCODONE-acetaminophen   Data Review:   Micro Results Recent Results (from the past 240 hour(s))  Blood Culture (routine x 2)     Status: None (Preliminary result)   Collection Time: 05/19/18  8:36 AM  Result Value Ref Range Status   Specimen Description BLOOD RIGHT ANTECUBITAL  Final   Special Requests   Final    BOTTLES DRAWN AEROBIC AND ANAEROBIC Blood Culture adequate volume   Culture   Final    NO GROWTH < 24 HOURS Performed at [facility], [address], [place], [place] [address]    Report Status PENDING  Incomplete  Blood Culture (routine x 2)     Status: None (Preliminary result)   Collection Time: 05/19/18  8:39 AM  Result Value Ref Range Status   Specimen Description BLOOD LEFT AC  Final   Special Requests   Final    BOTTLES DRAWN AEROBIC AND ANAEROBIC Blood Culture results may not be optimal due to an inadequate volume of blood received in culture bottles   Culture   Final    NO GROWTH < 24 HOURS Performed at [facility], [address] [place], [place] [address]    Report Status PENDING  Incomplete  MRSA PCR Screening     Status: None   Collection Time:  05/19/18  4:18 PM  Result Value Ref Range Status    MRSA by PCR NEGATIVE NEGATIVE Final    Comment:        The GeneXpert MRSA Assay (FDA approved for NASAL specimens only), is one component of a comprehensive MRSA colonization surveillance program. It is not intended to diagnose MRSA infection nor to guide or monitor treatment for MRSA infections. Performed at [facility], [address] [place], [place] [address]   Culture, sputum-assessment     Status: None   Collection Time: 05/19/18  7:26 PM  Result Value Ref Range Status   Specimen Description SPUTUM  Final   Special Requests NONE  Final   Sputum evaluation   Final    THIS SPECIMEN IS ACCEPTABLE FOR SPUTUM CULTURE Performed at [facility], [address] [place], [place] [address]    Report Status 05/19/2018 FINAL  Final  Culture, respiratory     Status: None (Preliminary result)   Collection Time: 05/19/18  7:26 PM  Result Value Ref Range Status   Specimen Description   Final    SPUTUM Performed at [facility], [address] [place], [place] [address]    Special Requests   Final    NONE Reflexed from [id number] Performed at [facility], [address] [place], [place] [address]    Gram Stain   Final    ABUNDANT WBC PRESENT,BOTH PMN AND MONONUCLEAR RARE GRAM POSITIVE COCCI Performed at [facility], [address] [place], [place] [address]    Culture PENDING  Incomplete   Report Status PENDING  Incomplete    Radiology Reports Dg Chest 2 View  Result Date: 05/19/2018 CLINICAL DATA:  Cough and fever; shortness of breath EXAM: CHEST - 2 VIEW COMPARISON:  January 22, 2018 FINDINGS: There is patchy airspace opacity in the lung bases, slightly more on the left than on the right. Lungs elsewhere are clear. Heart is borderline enlarged with slight pulmonary venous hypertension. No adenopathy. No bone lesions. IMPRESSION: Patchy infiltrate in the bases, felt to represent pneumonia. Pulmonary vascular congestion.  Electronically Signed   By: Bretta BangWilliam  Woodruff III M.D.   On: 05/19/2018 08:19   Dg Chest Port 1 View  Result Date: 05/20/2018 CLINICAL DATA:  Respiratory failure EXAM: PORTABLE CHEST 1 VIEW COMPARISON:  05/19/2018 FINDINGS: Moderate cardiomegaly with right-greater-than-left basilar predominant airspace opacity and right pleural effusion. Pulmonary edema suspected. IMPRESSION: Cardiomegaly, right pleural effusion and likely pulmonary edema, suggesting congestive heart failure. Electronically Signed   By: Deatra RobinsonKevin  Herman M.D.   On: 05/20/2018 04:49   Dg Chest Port 1 View  Result Date: 05/19/2018 CLINICAL DATA:  Onset of respiratory failure 1 hour prior to presentation. Patient has been transferred to the ICU. EXAM: PORTABLE CHEST 1 VIEW COMPARISON:  PA and lateral chest x-ray of May 19, 2018 FINDINGS: There is increased density at the right lung base as compared to the earlier study today. There is patchy increased density lateral to the left heart border which is new as well. The upper lobes are clear. The cardiac silhouette remains enlarged. The central pulmonary vascularity is somewhat more engorged. IMPRESSION: Increased density at the right lung base and in the left mid and lower lung may reflect atelectasis or worsening of pneumonia given the sudden change since this morning. No overt pulmonary edema although there is mild central pulmonary vascular prominence and mild cardiomegaly. Electronically Signed   By: David  Swazil[place]Jordan M.D.   On: 05/19/2018 16:32  CBC Recent Labs  Lab 05/19/18 0819 05/20/18 0517  WBC 13.5* 8.8  HGB 15.6 12.8*  HCT 47.8 39.8  PLT 141* 127*  MCV 93.4 93.4  MCH 30.5 30.0  MCHC 32.6 32.2  RDW 12.5 12.7  LYMPHSABS 0.6*  --   MONOABS 0.8  --   EOSABS 0.0  --   BASOSABS 0.0  --     Chemistries  Recent Labs  Lab 05/19/18 0819 05/20/18 0517  NA 137 137  K 4.0 3.3*  CL 101 106  CO2 26 24  GLUCOSE 211* 131*  BUN 17 14  CREATININE 1.37* 1.01  CALCIUM 9.0  7.9*  AST 32  --   ALT 47*  --   ALKPHOS 69  --   BILITOT 1.5*  --    ------------------------------------------------------------------------------------------------------------------ estimated creatinine clearance is 114.5 mL/min (by C-G formula based on SCr of 1.01 mg/dL). ------------------------------------------------------------------------------------------------------------------ No results for input(s): HGBA1C in the last 72 hours. ------------------------------------------------------------------------------------------------------------------ No results for input(s): CHOL, HDL, LDLCALC, TRIG, CHOLHDL, LDLDIRECT in the last 72 hours. ------------------------------------------------------------------------------------------------------------------ No results for input(s): TSH, T4TOTAL, T3FREE, THYROIDAB in the last 72 hours.  Invalid input(s): FREET3 ------------------------------------------------------------------------------------------------------------------ No results for input(s): VITAMINB12, FOLATE, FERRITIN, TIBC, IRON, RETICCTPCT in the last 72 hours.  Coagulation profile No results for input(s): INR, PROTIME in the last 168 hours.  No results for input(s): DDIMER in the last 72 hours.  Cardiac Enzymes Recent Labs  Lab 05/19/18 0819  TROPONINI 0.03*   ------------------------------------------------------------------------------------------------------------------ Invalid input(s): POCBNP    Assessment & Plan   Patient is a 43 year old with acute respiratory failure   1.    Acute respiratory failure This is due to community-acquired pneumonia possible fluid overload We will start IV Lasix I have notified Dr. Lonn Georgiaonforti of ICU of this obtain echocardiogram of the heart Continue IV ceftriaxone azithromycin Wean oxygen as tolerated Echocardiogram of the heart  2.  GERD continue PPI   3.  Miscellaneous Lovenox for DVT prophylaxis     Code Status  Orders  (From admission, onward)         Start     Ordered   05/19/18 1526  Full code  Continuous     05/19/18 1525        Code Status History    This patient has a current code status but no historical code status.    Advance Directive Documentation     Most Recent Value  Type of Advance Directive  Healthcare Power of Attorney  Pre-existing out of facility DNR order (yellow form or pink MOST form)  -  "MOST" Form in Place?  -           Consults intense  DVT Prophylaxis  Lovenox  Lab Results  Component Value Date   PLT 127 (L) 05/20/2018     Time Spent in minutes   35 minutes greater than 50% of time spent in care coordination and counseling patient regarding the condition and plan of care.   Auburn BilberryShreyang Karam Dunson M.D on 05/20/2018 at 4:22 PM  Between 7am to 6pm - Pager - [phone]  After 6pm go to [email or web address] - Social research officer, [email or web address] [email or web address]  [employer]   Office  [phone]

## 2018-05-21 ENCOUNTER — Inpatient Hospital Stay: Payer: BLUE CROSS/BLUE SHIELD

## 2018-05-21 ENCOUNTER — Inpatient Hospital Stay (HOSPITAL_COMMUNITY)
Admit: 2018-05-21 | Discharge: 2018-05-21 | Disposition: A | Discharge status: Home / Self Care | Payer: BLUE CROSS/BLUE SHIELD | Attending: Internal Medicine | Admitting: Internal Medicine

## 2018-05-21 DIAGNOSIS — R0602 Shortness of breath: Secondary | ICD-10-CM

## 2018-05-21 LAB — LEGIONELLA PNEUMOPHILA SEROGP 1 UR AG: L. pneumophila Serogp 1 Ur Ag: NEGATIVE

## 2018-05-21 LAB — BASIC METABOLIC PANEL
Anion gap: 10 (ref 5–15)
BUN: 13 mg/dL (ref 6–20)
CALCIUM: 8.2 mg/dL — AB (ref 8.9–10.3)
CO2: 24 mmol/L (ref 22–32)
Chloride: 99 mmol/L (ref 98–111)
Creatinine, Ser: 1.03 mg/dL (ref 0.61–1.24)
GFR calc Af Amer: >60 mL/min (ref >60)
GFR calc non Af Amer: >60 mL/min (ref >60)
Glucose, Bld: 244 mg/dL — ABNORMAL HIGH (ref 70–99)
Potassium: 3.5 mmol/L (ref 3.5–5.1)
Sodium: 133 mmol/L — ABNORMAL LOW (ref 135–145)

## 2018-05-21 LAB — VANCOMYCIN, TROUGH: Vancomycin Tr: 7 ug/mL — ABNORMAL LOW (ref 15–20)

## 2018-05-21 LAB — ECHOCARDIOGRAM COMPLETE
Height: 72 in
Weight: 3312.19 oz

## 2018-05-21 MED ORDER — DOCUSATE SODIUM 100 MG PO CAPS
100.0000 mg | ORAL_CAPSULE | Freq: Two times a day (BID) | ORAL | Status: DC
  Administered 2018-05-21 – 2018-05-27 (×13): 100 mg via ORAL
  Filled 2018-05-21 (×14): qty 1

## 2018-05-21 MED ORDER — METHYLPREDNISOLONE SODIUM SUCC 125 MG IJ SOLR
80.0000 mg | INTRAMUSCULAR | Status: AC
  Administered 2018-05-21: 80 mg via INTRAVENOUS
  Filled 2018-05-21: qty 2

## 2018-05-21 MED ORDER — POTASSIUM CHLORIDE CRYS ER 20 MEQ PO TBCR
40.0000 meq | EXTENDED_RELEASE_TABLET | Freq: Once | ORAL | Status: AC
  Administered 2018-05-21: 40 meq via ORAL
  Filled 2018-05-21: qty 2

## 2018-05-21 MED ORDER — FUROSEMIDE 10 MG/ML IJ SOLN
20.0000 mg | INTRAMUSCULAR | Status: AC
  Administered 2018-05-21: 20 mg via INTRAVENOUS
  Filled 2018-05-21: qty 2

## 2018-05-21 NOTE — Progress Notes (Signed)
[employer] - [place] [facility]                                                                                                                                                                                  Patient Demographics   Brett [name], is a 43 y.o. male, DOB - 07-24-1975, ZOX:[id number]  Admit date - 05/19/2018   Admitting Physician Auburn Bilberry, MD  Outpatient Primary MD for the patient is Patient, No Pcp Per   LOS - 2  Subjective: Continues to be  high flow oxygen  Review of Systems:   CONSTITUTIONAL: No documented fever. No fatigue, weakness. No weight gain, no weight loss.  EYES: No blurry or double vision.  ENT: No tinnitus. No postnasal drip. No redness of the oropharynx.  RESPIRATORY: Positive cough, positive wheeze, no hemoptysis.  Positive dyspnea.  CARDIOVASCULAR: No chest pain. No orthopnea. No palpitations. No syncope.  GASTROINTESTINAL: No nausea, no vomiting or diarrhea. No abdominal pain. No melena or hematochezia.  GENITOURINARY: No dysuria or hematuria.  ENDOCRINE: No polyuria or nocturia. No heat or cold intolerance.  HEMATOLOGY: No anemia. No bruising. No bleeding.  INTEGUMENTARY: No rashes. No lesions.  MUSCULOSKELETAL: No arthritis. No swelling. No gout.  NEUROLOGIC: No numbness, tingling, or ataxia. No seizure-type activity.  PSYCHIATRIC: No anxiety. No insomnia. No ADD.    Vitals:   Vitals:   05/21/18 1300 05/21/18 1400 05/21/18 1500 05/21/18 1505  BP: 112/70 106/70 124/72   Pulse: 99 99 (!) 104   Resp: (!) 23 17 14    Temp:  98.9 F (37.2 C)    TempSrc:  Axillary    SpO2: 99% 93% 95% 94%  Weight:      Height:        Wt Readings from Last 3 Encounters:  05/19/18 93.9 kg  05/17/18 93.9 kg  01/21/18 95.7 kg     Intake/Output Summary (Last 24 hours) at 05/21/2018 1557 Last data filed at 05/21/2018 1500 Gross per 24 hour  Intake 1250.51 ml  Output 2000 ml  Net -749.49 ml    Physical Exam:   GENERAL:  Pleasant-appearing in no apparent distress.  HEAD, EYES, EARS, NOSE AND THROAT: Atraumatic, normocephalic. Extraocular muscles are intact. Pupils equal and reactive to light. Sclerae anicteric. No conjunctival injection. No oro-pharyngeal erythema.  NECK: Supple. There is no jugular venous distention. No bruits, no lymphadenopathy, no thyromegaly.  HEART: Regular rate and rhythm,. No murmurs, no rubs, no clicks.  LUNGS: Rhonchus breath sounds bilaterally  aBDOMEN: Soft, flat, nontender, nondistended. Has good bowel sounds. No hepatosplenomegaly appreciated.  EXTREMITIES: No evidence of any cyanosis, clubbing, or peripheral edema.  +2 pedal and radial pulses bilaterally.  NEUROLOGIC: The patient is alert, awake, and oriented x3 with no focal motor or sensory deficits appreciated bilaterally.  SKIN: Moist and warm with no rashes appreciated.  Psych: Not anxious, depressed LN: No inguinal LN enlargement    Antibiotics   Anti-infectives (From admission, onward)   Start     Dose/Rate Route Frequency Ordered Stop   05/20/18 1000  azithromycin (ZITHROMAX) 500 mg in sodium chloride 0.9 % 250 mL IVPB     500 mg 250 mL/hr over 60 Minutes Intravenous Every 24 hours 05/19/18 1728 05/23/18 2359   05/19/18 2300  vancomycin (VANCOCIN) 1,250 mg in sodium chloride 0.9 % 250 mL IVPB  Status:  Discontinued     1,250 mg 166.7 mL/hr over 90 Minutes Intravenous Every 12 hours 05/19/18 1706 05/21/18 1020   05/19/18 1630  ceFEPIme (MAXIPIME) 1 g in sodium chloride 0.9 % 100 mL IVPB     1 g 200 mL/hr over 30 Minutes Intravenous Every 8 hours 05/19/18 1623     05/19/18 1630  vancomycin (VANCOCIN) IVPB 1000 mg/200 mL premix     1,000 mg 200 mL/hr over 60 Minutes Intravenous  Once 05/19/18 1623 05/19/18 1836   05/19/18 0845  cefTRIAXone (ROCEPHIN) 2 g in sodium chloride 0.9 % 100 mL IVPB  Status:  Discontinued     2 g 200 mL/hr over 30 Minutes Intravenous Every 24 hours 05/19/18 0830 05/19/18 1608   05/19/18  0845  azithromycin (ZITHROMAX) 500 mg in sodium chloride 0.9 % 250 mL IVPB  Status:  Discontinued     500 mg 250 mL/hr over 60 Minutes Intravenous Every 24 hours 05/19/18 0830 05/19/18 1728      Medications   Scheduled Meds: . docusate sodium  100 mg Oral BID  . enoxaparin (LOVENOX) injection  40 mg Subcutaneous Q24H  . famotidine  20 mg Oral QAC breakfast  . guaiFENesin  600 mg Oral BID  . ipratropium-albuterol  3 mL Nebulization Q4H   Continuous Infusions: . azithromycin Stopped (05/21/18 1321)  . ceFEPime (MAXIPIME) IV 1 g (05/21/18 1438)   PRN Meds:.acetaminophen **OR** acetaminophen, guaiFENesin-codeine, ibuprofen, ipratropium-albuterol, morphine injection, ondansetron **OR** ondansetron (ZOFRAN) IV, oxyCODONE-acetaminophen   Data Review:   Micro Results Recent Results (from the past 240 hour(s))  Blood Culture (routine x 2)     Status: None (Preliminary result)   Collection Time: 05/19/18  8:36 AM  Result Value Ref Range Status   Specimen Description BLOOD RIGHT ANTECUBITAL  Final   Special Requests   Final    BOTTLES DRAWN AEROBIC AND ANAEROBIC Blood Culture adequate volume   Culture   Final    NO GROWTH 2 DAYS Performed at [facility], [address] [place], [place] [address]    Report Status PENDING  Incomplete  Blood Culture (routine x 2)     Status: None (Preliminary result)   Collection Time: 05/19/18  8:39 AM  Result Value Ref Range Status   Specimen Description BLOOD LEFT AC  Final   Special Requests   Final    BOTTLES DRAWN AEROBIC AND ANAEROBIC Blood Culture results may not be optimal due to an inadequate volume of blood received in culture bottles   Culture   Final    NO GROWTH 2 DAYS Performed at [facility], [address] [place], [place] [address]    Report Status PENDING  Incomplete  MRSA PCR Screening     Status: None   Collection Time: 05/19/18  4:18 PM  Result Value Ref Range  Status   MRSA by PCR NEGATIVE NEGATIVE  Final    Comment:        The GeneXpert MRSA Assay (FDA approved for NASAL specimens only), is one component of a comprehensive MRSA colonization surveillance program. It is not intended to diagnose MRSA infection nor to guide or monitor treatment for MRSA infections. Performed at [facility], [address] [place], [place] [address]   Culture, sputum-assessment     Status: None   Collection Time: 05/19/18  7:26 PM  Result Value Ref Range Status   Specimen Description SPUTUM  Final   Special Requests NONE  Final   Sputum evaluation   Final    THIS SPECIMEN IS ACCEPTABLE FOR SPUTUM CULTURE Performed at [facility], [address], [place], [place] [address]    Report Status 05/19/2018 FINAL  Final  Culture, respiratory     Status: None (Preliminary result)   Collection Time: 05/19/18  7:26 PM  Result Value Ref Range Status   Specimen Description   Final    SPUTUM Performed at [facility], [address], [place], [place] [address]    Special Requests   Final    NONE Reflexed from [id number] Performed at [facility], [address] [place], [place] [address]    Gram Stain   Final    ABUNDANT WBC PRESENT,BOTH PMN AND MONONUCLEAR RARE GRAM POSITIVE COCCI    Culture   Final    CULTURE REINCUBATED FOR BETTER GROWTH Performed at [facility], [address] [place], [place] [address]    Report Status PENDING  Incomplete    Radiology Reports Dg Chest 2 View  Result Date: 05/19/2018 CLINICAL DATA:  Cough and fever; shortness of breath EXAM: CHEST - 2 VIEW COMPARISON:  January 22, 2018 FINDINGS: There is patchy airspace opacity in the lung bases, slightly more on the left than on the right. Lungs elsewhere are clear. Heart is borderline enlarged with slight pulmonary venous hypertension. No adenopathy. No bone lesions. IMPRESSION: Patchy infiltrate in the bases, felt to represent pneumonia. Pulmonary vascular congestion.  Electronically Signed   By: Bretta Bang III M.D.   On: 05/19/2018 08:19   Ct Chest Wo Contrast  Result Date: 05/21/2018 CLINICAL DATA:  43 year old with worsening hypoxemic respiratory failure. Community-acquired pneumonia on antibiotics. EXAM: CT CHEST WITHOUT CONTRAST TECHNIQUE: Multidetector CT imaging of the chest was performed following the standard protocol without IV contrast. COMPARISON:  Prior radiographs, most recently done today. FINDINGS: Cardiovascular: No significant vascular findings identified on noncontrast imaging. The heart is mildly enlarged. No significant pericardial effusion. Mediastinum/Nodes: There are no enlarged mediastinal, hilar or axillary lymph nodes.There is a 9 mm AP window node on image 47/2. Hilar assessment is limited by the lack of intravenous contrast. The thyroid gland, trachea and esophagus demonstrate no significant findings. Lungs/Pleura: Trace pleural fluid bilaterally. There is extensive lower lobe consolidation with air bronchograms bilaterally. Additional patchy airspace and ground-glass opacities are present in both upper lobes and in the right middle lobe. No focal mass or endobronchial lesion identified. Upper abdomen: Borderline hepatic steatosis without focal abnormality on noncontrast imaging. Musculoskeletal/Chest wall: There is no chest wall mass or suspicious osseous finding. IMPRESSION: 1. Multifocal ground-glass and airspace opacities in both lungs with areas of consolidation in both lower lobes and the right middle lobe, most consistent with multilobar pneumonia. These findings appear mildly progressive on recent chest radiographs. 2. No significant pleural effusion, lung mass or significant adenopathy identified. Electronically  Signed   By: Carey Bullocks M.D.   On: 05/21/2018 10:44   Dg Chest Port 1 View  Result Date: 05/21/2018 CLINICAL DATA:  Acute onset of respiratory failure. EXAM: PORTABLE CHEST 1 VIEW COMPARISON:  Chest radiograph  performed 05/20/2018 FINDINGS: The lungs are well-aerated. Small bilateral pleural effusions are noted. Patchy bilateral airspace opacification is concerning for pneumonia, more focal than on the prior study. There is no evidence of pleural effusion or pneumothorax. The cardiomediastinal silhouette is mildly enlarged. No acute osseous abnormalities are seen. IMPRESSION: 1. Small bilateral pleural effusions noted. Patchy bilateral airspace opacification is concerning for pneumonia, more focal than on the prior study. Followup PA and lateral chest X-ray is recommended in 3-4 weeks following trial of antibiotic therapy to ensure resolution and exclude underlying malignancy. 2. Mild cardiomegaly. Electronically Signed   By: Roanna Raider M.D.   On: 05/21/2018 01:31   Dg Chest Port 1 View  Result Date: 05/20/2018 CLINICAL DATA:  Respiratory failure EXAM: PORTABLE CHEST 1 VIEW COMPARISON:  05/19/2018 FINDINGS: Moderate cardiomegaly with right-greater-than-left basilar predominant airspace opacity and right pleural effusion. Pulmonary edema suspected. IMPRESSION: Cardiomegaly, right pleural effusion and likely pulmonary edema, suggesting congestive heart failure. Electronically Signed   By: Deatra Robinson M.D.   On: 05/20/2018 04:49   Dg Chest Port 1 View  Result Date: 05/19/2018 CLINICAL DATA:  Onset of respiratory failure 1 hour prior to presentation. Patient has been transferred to the ICU. EXAM: PORTABLE CHEST 1 VIEW COMPARISON:  PA and lateral chest x-ray of May 19, 2018 FINDINGS: There is increased density at the right lung base as compared to the earlier study today. There is patchy increased density lateral to the left heart border which is new as well. The upper lobes are clear. The cardiac silhouette remains enlarged. The central pulmonary vascularity is somewhat more engorged. IMPRESSION: Increased density at the right lung base and in the left mid and lower lung may reflect atelectasis or worsening  of pneumonia given the sudden change since this morning. No overt pulmonary edema although there is mild central pulmonary vascular prominence and mild cardiomegaly. Electronically Signed   By: David  [place] M.D.   On: 05/19/2018 16:32     CBC Recent Labs  Lab 05/19/18 0819 05/20/18 0517  WBC 13.5* 8.8  HGB 15.6 12.8*  HCT 47.8 39.8  PLT 141* 127*  MCV 93.4 93.4  MCH 30.5 30.0  MCHC 32.6 32.2  RDW 12.5 12.7  LYMPHSABS 0.6*  --   MONOABS 0.8  --   EOSABS 0.0  --   BASOSABS 0.0  --     Chemistries  Recent Labs  Lab 05/19/18 0819 05/20/18 0517 05/21/18 0847  NA 137 137 133*  K 4.0 3.3* 3.5  CL 101 106 99  CO2 26 24 24   GLUCOSE 211* 131* 244*  BUN 17 14 13   CREATININE 1.37* 1.01 1.03  CALCIUM 9.0 7.9* 8.2*  AST 32  --   --   ALT 47*  --   --   ALKPHOS 69  --   --   BILITOT 1.5*  --   --    ------------------------------------------------------------------------------------------------------------------ estimated creatinine clearance is 112.3 mL/min (by C-G formula based on SCr of 1.03 mg/dL). ------------------------------------------------------------------------------------------------------------------ No results for input(s): HGBA1C in the last 72 hours. ------------------------------------------------------------------------------------------------------------------ No results for input(s): CHOL, HDL, LDLCALC, TRIG, CHOLHDL, LDLDIRECT in the last 72 hours. ------------------------------------------------------------------------------------------------------------------ No results for input(s): TSH, T4TOTAL, T3FREE, THYROIDAB in the last 72 hours.  Invalid input(s): FREET3 ------------------------------------------------------------------------------------------------------------------  No results for input(s): VITAMINB12, FOLATE, FERRITIN, TIBC, IRON, RETICCTPCT in the last 72 hours.  Coagulation profile No results for input(s): INR, PROTIME in the last 168  hours.  No results for input(s): DDIMER in the last 72 hours.  Cardiac Enzymes Recent Labs  Lab 05/19/18 0819  TROPONINI 0.03*   ------------------------------------------------------------------------------------------------------------------ Invalid input(s): POCBNP    Assessment & Plan   Patient is a 43 year old with acute respiratory failure   1.    Acute respiratory failure This is due to community-acquired pneumonia possible fluid overload Continue IV ceftriaxone azithromycin Wean oxygen as tolerated Echocardiogram of the heart results pending Status post CT scan of the chest with worsening pneumonia pulmonary following  2.  GERD continue PPI   3.  Miscellaneous Lovenox for DVT prophylaxis     Code Status Orders  (From admission, onward)         Start     Ordered   05/19/18 1526  Full code  Continuous     05/19/18 1525        Code Status History    This patient has a current code status but no historical code status.    Advance Directive Documentation     Most Recent Value  Type of Advance Directive  Healthcare Power of Attorney  Pre-existing out of facility DNR order (yellow form or pink MOST form)  -  "MOST" Form in Place?  -           Consults intense  DVT Prophylaxis  Lovenox  Lab Results  Component Value Date   PLT 127 (L) 05/20/2018     Time Spent in minutes   35 minutes greater than 50% of time spent in care coordination and counseling patient regarding the condition and plan of care.   Auburn BilberryShreyang Montae Stager M.D on 05/21/2018 at 3:57 PM  Between 7am to 6pm - Pager - [phone]  After 6pm go to [email or web address] - Social research officer, [email or web address] [email or web address]  [employer]   Office  [phone]

## 2018-05-21 NOTE — Progress Notes (Signed)
*  PRELIMINARY RESULTS* Echocardiogram 2D Echocardiogram has been performed.  Brett [name] Glender [place] 05/21/2018, 9:57 AM

## 2018-05-21 NOTE — Progress Notes (Signed)
Follow up - Critical Care Medicine Note  Patient Details:    Brett [name] is an 43 y.o. male.with a past medical history remarkable for acid reflux and speech impediment presented to the emergency room complaining of cough, increasing shortness of breath, fever and sputum production.  Was seen in emergency department initially had a negative flu test and was subsequently discharged home.  Represented with progressive increasing shortness of breath and admitted to the floor for community-acquired pneumonia.  Initially started on azithromycin and Rocephin.  Has been developing worsening hypoxemic respiratory failure and has been subsequently transferred to the intensive care unit.    Lines, Airways, Drains:    Anti-infectives:  Anti-infectives (From admission, onward)   Start     Dose/Rate Route Frequency Ordered Stop   05/20/18 1000  azithromycin (ZITHROMAX) 500 mg in sodium chloride 0.9 % 250 mL IVPB     500 mg 250 mL/hr over 60 Minutes Intravenous Every 24 hours 05/19/18 1728     05/19/18 2300  vancomycin (VANCOCIN) 1,250 mg in sodium chloride 0.9 % 250 mL IVPB     1,250 mg 166.7 mL/hr over 90 Minutes Intravenous Every 12 hours 05/19/18 1706     05/19/18 1630  ceFEPIme (MAXIPIME) 1 g in sodium chloride 0.9 % 100 mL IVPB     1 g 200 mL/hr over 30 Minutes Intravenous Every 8 hours 05/19/18 1623     05/19/18 1630  vancomycin (VANCOCIN) IVPB 1000 mg/200 mL premix     1,000 mg 200 mL/hr over 60 Minutes Intravenous  Once 05/19/18 1623 05/19/18 1836   05/19/18 0845  cefTRIAXone (ROCEPHIN) 2 g in sodium chloride 0.9 % 100 mL IVPB  Status:  Discontinued     2 g 200 mL/hr over 30 Minutes Intravenous Every 24 hours 05/19/18 0830 05/19/18 1608   05/19/18 0845  azithromycin (ZITHROMAX) 500 mg in sodium chloride 0.9 % 250 mL IVPB  Status:  Discontinued     500 mg 250 mL/hr over 60 Minutes Intravenous Every 24 hours 05/19/18 0830 05/19/18 1728      Microbiology: Results for orders placed or  performed during the hospital encounter of 05/19/18  Blood Culture (routine x 2)     Status: None (Preliminary result)   Collection Time: 05/19/18  8:36 AM  Result Value Ref Range Status   Specimen Description BLOOD RIGHT ANTECUBITAL  Final   Special Requests   Final    BOTTLES DRAWN AEROBIC AND ANAEROBIC Blood Culture adequate volume   Culture   Final    NO GROWTH 2 DAYS Performed at [facility], [address], [place], [place] [address]    Report Status PENDING  Incomplete  Blood Culture (routine x 2)     Status: None (Preliminary result)   Collection Time: 05/19/18  8:39 AM  Result Value Ref Range Status   Specimen Description BLOOD LEFT AC  Final   Special Requests   Final    BOTTLES DRAWN AEROBIC AND ANAEROBIC Blood Culture results may not be optimal due to an inadequate volume of blood received in culture bottles   Culture   Final    NO GROWTH 2 DAYS Performed at [facility], [address] [place], [place] [address]    Report Status PENDING  Incomplete  MRSA PCR Screening     Status: None   Collection Time: 05/19/18  4:18 PM  Result Value Ref Range Status   MRSA by PCR NEGATIVE NEGATIVE Final    Comment:  The GeneXpert MRSA Assay (FDA approved for NASAL specimens only), is one component of a comprehensive MRSA colonization surveillance program. It is not intended to diagnose MRSA infection nor to guide or monitor treatment for MRSA infections. Performed at [facility], [address] [place], [place] [address]   Culture, sputum-assessment     Status: None   Collection Time: 05/19/18  7:26 PM  Result Value Ref Range Status   Specimen Description SPUTUM  Final   Special Requests NONE  Final   Sputum evaluation   Final    THIS SPECIMEN IS ACCEPTABLE FOR SPUTUM CULTURE Performed at [facility], [address] [place], [place] [address]    Report Status 05/19/2018 FINAL  Final  Culture, respiratory     Status:  None (Preliminary result)   Collection Time: 05/19/18  7:26 PM  Result Value Ref Range Status   Specimen Description   Final    SPUTUM Performed at [facility], [address] [place], [place] [address]    Special Requests   Final    NONE Reflexed from [id number] Performed at [facility], [address] [place], [place] [address]    Gram Stain   Final    ABUNDANT WBC PRESENT,BOTH PMN AND MONONUCLEAR RARE GRAM POSITIVE COCCI Performed at [facility], [address] [place], [place] [address]    Culture PENDING  Incomplete   Report Status PENDING  Incomplete    Studies: Dg Chest 2 View  Result Date: 05/19/2018 CLINICAL DATA:  Cough and fever; shortness of breath EXAM: CHEST - 2 VIEW COMPARISON:  January 22, 2018 FINDINGS: There is patchy airspace opacity in the lung bases, slightly more on the left than on the right. Lungs elsewhere are clear. Heart is borderline enlarged with slight pulmonary venous hypertension. No adenopathy. No bone lesions. IMPRESSION: Patchy infiltrate in the bases, felt to represent pneumonia. Pulmonary vascular congestion. Electronically Signed   By: Bretta BangWilliam  Woodruff III M.D.   On: 05/19/2018 08:19   Dg Chest Port 1 View  Result Date: 05/21/2018 CLINICAL DATA:  Acute onset of respiratory failure. EXAM: PORTABLE CHEST 1 VIEW COMPARISON:  Chest radiograph performed 05/20/2018 FINDINGS: The lungs are well-aerated. Small bilateral pleural effusions are noted. Patchy bilateral airspace opacification is concerning for pneumonia, more focal than on the prior study. There is no evidence of pleural effusion or pneumothorax. The cardiomediastinal silhouette is mildly enlarged. No acute osseous abnormalities are seen. IMPRESSION: 1. Small bilateral pleural effusions noted. Patchy bilateral airspace opacification is concerning for pneumonia, more focal than on the prior study. Followup PA and lateral chest X-ray is recommended in 3-4 weeks following  trial of antibiotic therapy to ensure resolution and exclude underlying malignancy. 2. Mild cardiomegaly. Electronically Signed   By: Roanna RaiderJeffery  Chang M.D.   On: 05/21/2018 01:31   Dg Chest Port 1 View  Result Date: 05/20/2018 CLINICAL DATA:  Respiratory failure EXAM: PORTABLE CHEST 1 VIEW COMPARISON:  05/19/2018 FINDINGS: Moderate cardiomegaly with right-greater-than-left basilar predominant airspace opacity and right pleural effusion. Pulmonary edema suspected. IMPRESSION: Cardiomegaly, right pleural effusion and likely pulmonary edema, suggesting congestive heart failure. Electronically Signed   By: Deatra RobinsonKevin  Herman M.D.   On: 05/20/2018 04:49   Dg Chest Port 1 View  Result Date: 05/19/2018 CLINICAL DATA:  Onset of respiratory failure 1 hour prior to presentation. Patient has been transferred to the ICU. EXAM: PORTABLE CHEST 1 VIEW COMPARISON:  PA and lateral chest x-ray of May 19, 2018 FINDINGS: There  is increased density at the right lung base as compared to the earlier study today. There is patchy increased density lateral to the left heart border which is new as well. The upper lobes are clear. The cardiac silhouette remains enlarged. The central pulmonary vascularity is somewhat more engorged. IMPRESSION: Increased density at the right lung base and in the left mid and lower lung may reflect atelectasis or worsening of pneumonia given the sudden change since this morning. No overt pulmonary edema although there is mild central pulmonary vascular prominence and mild cardiomegaly. Electronically Signed   By: David  Swazil[place]Jordan M.D.   On: 05/19/2018 16:32    Consults:    Subjective:    Overnight Issues: Patient had some difficulty overnight requiring transient increasing FiO2.  Now presently down to 75%.  States he is continued to cough and bring up sputum.  States he feels ok this morning  Objective:  Vital signs for last 24 hours: Temp:  [98.5 F (36.9 C)-102.1 F (38.9 C)] 98.6 F (37 C)  (01/02 0800) Pulse Rate:  [90-117] 104 (01/02 0800) Resp:  [22-38] 29 (01/02 0800) BP: (95-134)/(51-87) 122/76 (01/02 0800) SpO2:  [83 %-99 %] 83 % (01/02 0800) FiO2 (%):  [80 %-100 %] 95 % (01/02 0326)  Hemodynamic parameters for last 24 hours:    Intake/Output from previous day: 01/01 0701 - 01/02 0700 In: 1364.7 [P.O.:240; IV Piggyback:1124.7] Out: 3575 [Urine:3575]  Intake/Output this shift: No intake/output data recorded.  Vent settings for last 24 hours: FiO2 (%):  [80 %-100 %] 95 %  Physical Exam:  Patient is awake, alert, no acute distress.  Is saturating at 95% on the monitor Vital signs:       Please see the above listed vital signs HEENT:           Trachea midline, no thyromegaly appreciated, no oral lesions appreciated, no jugular venous distention, accessory muscle utilization noted Cardiovascular:           Tachycardia sinus mechanism noted on monitor Pulmonary:      Crackles appreciated diffusely Abdominal:      Positive bowel sounds, soft exam Extremities:     No clubbing, cyanosis or edema noted Neurologic:      Cranial nerves are grossly intact without any focal deficits appreciated Cutaneous:      No rashes or lesions noted  Assessment/Plan:  Respiratory distress.  Patient with pneumonia, febrile, white count, sputum production, recent sick contacts with progressive changes on chest x-ray.  On heated high flow,coverage to include vancomycin, cefepime and azithromycin.  Pending urine for Legionella, urine pneumococcus negative, HIV negative, sputum studies negative so far.  Repeat chest x-ray performed reveals multifocal airspace disease along with small effusions.  Will perform CT scan of the chest to further characterize parenchymal/pleural involvement if significant effusion will perform IR guided thoracentesis.  We will continue to wean FiO2 as tolerated, bronchodilators, incentive spirometry  Tora KindredJohn Jackie Littlejohn, DO  Tyre Beaver 05/21/2018  *Care during the  described time interval was provided by me and/or other providers on the critical care team.  I have reviewed this patient's available data, including medical history, events of note, physical examination and test results as part of my evaluation. Patient ID: Vickki MuffDevon Canter, male   DOB: 10/15/1975, 43 y.o.   MRN: [id number]

## 2018-05-21 NOTE — Progress Notes (Signed)
Pharmacy Antibiotic Note  Brett [name] is a 43 y.o. male admitted on 05/19/2018 with pneumonia.  Patient admitted through the ED and initially transferred to the floor on azithromycin and ceftriaxone. Patients condition further declined and patient transferred to the ICU. Pharmacy has been consulted for vancomycin and cefepime dosing. Patient is to remain on azithromycin 500mg  IV Q24hr x 5 days total. Vancomycin discontinued during rounds on 01/02.   Plan: Continue Cefepime 1g IV Q8hr.     Height: 6' (182.9 cm) Weight: 207 lb 0.2 oz (93.9 kg) IBW/kg (Calculated) : 77.6  Temp (24hrs), Avg:99.5 F (37.5 C), Min:98.5 F (36.9 C), Max:101 F (38.3 C)  Recent Labs  Lab 05/19/18 0819 05/19/18 0836 05/19/18 1111 05/20/18 0517 05/21/18 0847 05/21/18 1001  WBC 13.5*  --   --  8.8  --   --   CREATININE 1.37*  --   --  1.01 1.03  --   LATICACIDVEN  --  2.5* 1.3  --   --   --   VANCOTROUGH  --   --   --   --   --  7*    Estimated Creatinine Clearance: 112.3 mL/min (by C-G formula based on SCr of 1.03 mg/dL).    Allergies  Allergen Reactions  . Pantoprazole Hives  . Penicillins Hives    Antimicrobials this admission: Ceftriaxone 12/13 x 1  Azithromycin 12/31  >>  Cefepime 12/31 >>  Vancomycin 12/31 >> 01/02  Dose adjustments this admission: N/A  Microbiology results: 12/31 BCx: NG TD  12/31 Sputum: Rare GPC  12/31 MRSA PCR: negative    Thank you for allowing pharmacy to be a part of this patient's care.  Gardner Candle, PharmD, BCPS Clinical Pharmacist 05/21/2018 11:24 AM

## 2018-05-22 DIAGNOSIS — R0602 Shortness of breath: Secondary | ICD-10-CM

## 2018-05-22 LAB — CULTURE, RESPIRATORY W GRAM STAIN: Culture: NORMAL

## 2018-05-22 LAB — PROCALCITONIN: PROCALCITONIN: 3.34 ng/mL

## 2018-05-22 LAB — CULTURE, RESPIRATORY

## 2018-05-22 MED ORDER — AZITHROMYCIN 500 MG PO TABS
500.0000 mg | ORAL_TABLET | Freq: Every day | ORAL | Status: AC
  Administered 2018-05-22 – 2018-05-23 (×2): 500 mg via ORAL
  Filled 2018-05-22 (×3): qty 1

## 2018-05-22 MED ORDER — ALUM & MAG HYDROXIDE-SIMETH 200-200-20 MG/5ML PO SUSP
30.0000 mL | ORAL | Status: DC | PRN
  Administered 2018-05-22: 30 mL via ORAL
  Filled 2018-05-22 (×2): qty 30

## 2018-05-22 MED ORDER — SODIUM CHLORIDE 0.9 % IV SOLN
2.0000 g | INTRAVENOUS | Status: DC
  Administered 2018-05-22 – 2018-05-24 (×3): 2 g via INTRAVENOUS
  Filled 2018-05-22 (×3): qty 2

## 2018-05-22 NOTE — Progress Notes (Signed)
[employer] - [place] at [facility]                                                                                                                                                                                  Patient Demographics   Brett [name], is a 43 y.o. male, DOB - Aug 15, 1975, JYN:[id number]  Admit date - 05/19/2018   Admitting Physician Auburn Bilberry, MD  Outpatient Primary MD for the patient is Patient, No Pcp Per   LOS - 3  Subjective: Patient continues to have high flow oxygen requirement he states that he is feeling better  Review of Systems:   CONSTITUTIONAL: No documented fever. No fatigue, weakness. No weight gain, no weight loss.  EYES: No blurry or double vision.  ENT: No tinnitus. No postnasal drip. No redness of the oropharynx.  RESPIRATORY: Positive cough, positive wheeze, no hemoptysis.  Positive dyspnea.  CARDIOVASCULAR: No chest pain. No orthopnea. No palpitations. No syncope.  GASTROINTESTINAL: No nausea, no vomiting or diarrhea. No abdominal pain. No melena or hematochezia.  GENITOURINARY: No dysuria or hematuria.  ENDOCRINE: No polyuria or nocturia. No heat or cold intolerance.  HEMATOLOGY: No anemia. No bruising. No bleeding.  INTEGUMENTARY: No rashes. No lesions.  MUSCULOSKELETAL: No arthritis. No swelling. No gout.  NEUROLOGIC: No numbness, tingling, or ataxia. No seizure-type activity.  PSYCHIATRIC: No anxiety. No insomnia. No ADD.    Vitals:   Vitals:   05/22/18 1200 05/22/18 1300 05/22/18 1400 05/22/18 1500  BP: 126/80  114/70 113/64  Pulse: 90 78 74 72  Resp: (!) 24 (!) 21 (!) 25 (!) 23  Temp:   98.2 F (36.8 C)   TempSrc:   Oral   SpO2: 96% 96% 96% 97%  Weight:      Height:        Wt Readings from Last 3 Encounters:  05/19/18 93.9 kg  05/17/18 93.9 kg  01/21/18 95.7 kg     Intake/Output Summary (Last 24 hours) at 05/22/2018 1705 Last data filed at 05/22/2018 1418 Gross per 24 hour  Intake 320 ml  Output 2975 ml   Net -2655 ml    Physical Exam:   GENERAL: Pleasant-appearing in no apparent distress.  HEAD, EYES, EARS, NOSE AND THROAT: Atraumatic, normocephalic. Extraocular muscles are intact. Pupils equal and reactive to light. Sclerae anicteric. No conjunctival injection. No oro-pharyngeal erythema.  NECK: Supple. There is no jugular venous distention. No bruits, no lymphadenopathy, no thyromegaly.  HEART: Regular rate and rhythm,. No murmurs, no rubs, no clicks.  LUNGS: Rhonchus breath sounds bilaterally  aBDOMEN: Soft, flat, nontender, nondistended. Has good bowel sounds. No hepatosplenomegaly appreciated.  EXTREMITIES: No evidence of any cyanosis, clubbing,  or peripheral edema.  +2 pedal and radial pulses bilaterally.  NEUROLOGIC: The patient is alert, awake, and oriented x3 with no focal motor or sensory deficits appreciated bilaterally.  SKIN: Moist and warm with no rashes appreciated.  Psych: Not anxious, depressed LN: No inguinal LN enlargement    Antibiotics   Anti-infectives (From admission, onward)   Start     Dose/Rate Route Frequency Ordered Stop   05/22/18 1200  azithromycin (ZITHROMAX) tablet 500 mg     500 mg Oral Daily 05/22/18 0842 05/24/18 1159   05/22/18 1200  cefTRIAXone (ROCEPHIN) 2 g in sodium chloride 0.9 % 100 mL IVPB     2 g 200 mL/hr over 30 Minutes Intravenous Every 24 hours 05/22/18 1028     05/20/18 1000  azithromycin (ZITHROMAX) 500 mg in sodium chloride 0.9 % 250 mL IVPB  Status:  Discontinued     500 mg 250 mL/hr over 60 Minutes Intravenous Every 24 hours 05/19/18 1728 05/22/18 0842   05/19/18 2300  vancomycin (VANCOCIN) 1,250 mg in sodium chloride 0.9 % 250 mL IVPB  Status:  Discontinued     1,250 mg 166.7 mL/hr over 90 Minutes Intravenous Every 12 hours 05/19/18 1706 05/21/18 1020   05/19/18 1630  ceFEPIme (MAXIPIME) 1 g in sodium chloride 0.9 % 100 mL IVPB  Status:  Discontinued     1 g 200 mL/hr over 30 Minutes Intravenous Every 8 hours 05/19/18 1623  05/22/18 1028   05/19/18 1630  vancomycin (VANCOCIN) IVPB 1000 mg/200 mL premix     1,000 mg 200 mL/hr over 60 Minutes Intravenous  Once 05/19/18 1623 05/19/18 1836   05/19/18 0845  cefTRIAXone (ROCEPHIN) 2 g in sodium chloride 0.9 % 100 mL IVPB  Status:  Discontinued     2 g 200 mL/hr over 30 Minutes Intravenous Every 24 hours 05/19/18 0830 05/19/18 1608   05/19/18 0845  azithromycin (ZITHROMAX) 500 mg in sodium chloride 0.9 % 250 mL IVPB  Status:  Discontinued     500 mg 250 mL/hr over 60 Minutes Intravenous Every 24 hours 05/19/18 0830 05/19/18 1728      Medications   Scheduled Meds: . azithromycin  500 mg Oral Daily  . docusate sodium  100 mg Oral BID  . enoxaparin (LOVENOX) injection  40 mg Subcutaneous Q24H  . famotidine  20 mg Oral QAC breakfast  . guaiFENesin  600 mg Oral BID  . ipratropium-albuterol  3 mL Nebulization Q4H   Continuous Infusions: . cefTRIAXone (ROCEPHIN)  IV 2 g (05/22/18 1415)   PRN Meds:.acetaminophen **OR** acetaminophen, alum & mag hydroxide-simeth, guaiFENesin-codeine, ibuprofen, ipratropium-albuterol, morphine injection, ondansetron **OR** ondansetron (ZOFRAN) IV, oxyCODONE-acetaminophen   Data Review:   Micro Results Recent Results (from the past 240 hour(s))  Blood Culture (routine x 2)     Status: None (Preliminary result)   Collection Time: 05/19/18  8:36 AM  Result Value Ref Range Status   Specimen Description BLOOD RIGHT ANTECUBITAL  Final   Special Requests   Final    BOTTLES DRAWN AEROBIC AND ANAEROBIC Blood Culture adequate volume   Culture   Final    NO GROWTH 2 DAYS Performed at [facility], [address] [place], [place] [address]    Report Status PENDING  Incomplete  Blood Culture (routine x 2)     Status: None (Preliminary result)   Collection Time: 05/19/18  8:39 AM  Result Value Ref Range Status   Specimen Description [facility]LOOD LEFT [facility]  Final   Special Requests  Final    BOTTLES DRAWN AEROBIC AND ANAEROBIC Blood  Culture results may not be optimal due to an inadequate volume of blood received in culture bottles   Culture   Final    NO GROWTH 2 DAYS Performed at [facility], [address] [place], [place] [address]    Report Status PENDING  Incomplete  MRSA PCR Screening     Status: None   Collection Time: 05/19/18  4:18 PM  Result Value Ref Range Status   MRSA by PCR NEGATIVE NEGATIVE Final    Comment:        The GeneXpert MRSA Assay (FDA approved for NASAL specimens only), is one component of a comprehensive MRSA colonization surveillance program. It is not intended to diagnose MRSA infection nor to guide or monitor treatment for MRSA infections. Performed at [facility], [address] [place], [place] [address]   Culture, sputum-assessment     Status: None   Collection Time: 05/19/18  7:26 PM  Result Value Ref Range Status   Specimen Description SPUTUM  Final   Special Requests NONE  Final   Sputum evaluation   Final    THIS SPECIMEN IS ACCEPTABLE FOR SPUTUM CULTURE Performed at [facility], [address], [place], [place] [address]    Report Status 05/19/2018 FINAL  Final  Culture, respiratory     Status: None   Collection Time: 05/19/18  7:26 PM  Result Value Ref Range Status   Specimen Description   Final    SPUTUM Performed at [facility], [address] [place], [place] [address]    Special Requests   Final    NONE Reflexed from [phone] Performed at [facility], [address] [place], [place] [address]    Gram Stain   Final    ABUNDANT WBC PRESENT,BOTH PMN AND MONONUCLEAR RARE GRAM POSITIVE COCCI    Culture   Final    FEW Consistent with normal respiratory flora. Performed at [facility], [address], [place], [place] [address]    Report Status 05/22/2018 FINAL  Final    Radiology Reports Dg Chest 2 View  Result Date: 05/19/2018 CLINICAL DATA:  Cough and fever; shortness of breath EXAM:  CHEST - 2 VIEW COMPARISON:  January 22, 2018 FINDINGS: There is patchy airspace opacity in the lung bases, slightly more on the left than on the right. Lungs elsewhere are clear. Heart is borderline enlarged with slight pulmonary venous hypertension. No adenopathy. No bone lesions. IMPRESSION: Patchy infiltrate in the bases, felt to represent pneumonia. Pulmonary vascular congestion. Electronically Signed   By: Bretta Bang III M.D.   On: 05/19/2018 08:19   Ct Chest Wo Contrast  Result Date: 05/21/2018 CLINICAL DATA:  43 year old with worsening hypoxemic respiratory failure. Community-acquired pneumonia on antibiotics. EXAM: CT CHEST WITHOUT CONTRAST TECHNIQUE: Multidetector CT imaging of the chest was performed following the standard protocol without IV contrast. COMPARISON:  Prior radiographs, most recently done today. FINDINGS: Cardiovascular: No significant vascular findings identified on noncontrast imaging. The heart is mildly enlarged. No significant pericardial effusion. Mediastinum/Nodes: There are no enlarged mediastinal, hilar or axillary lymph nodes.There is a 9 mm AP window node on image 47/2. Hilar assessment is limited by the lack of intravenous contrast. The thyroid gland, trachea and esophagus demonstrate no significant findings. Lungs/Pleura: Trace pleural fluid bilaterally. There is extensive lower lobe consolidation with air bronchograms bilaterally. Additional patchy airspace and ground-glass opacities are present in both upper lobes and in  the right middle lobe. No focal mass or endobronchial lesion identified. Upper abdomen: Borderline hepatic steatosis without focal abnormality on noncontrast imaging. Musculoskeletal/Chest wall: There is no chest wall mass or suspicious osseous finding. IMPRESSION: 1. Multifocal ground-glass and airspace opacities in both lungs with areas of consolidation in both lower lobes and the right middle lobe, most consistent with multilobar pneumonia. These  findings appear mildly progressive on recent chest radiographs. 2. No significant pleural effusion, lung mass or significant adenopathy identified. Electronically Signed   By: Carey Bullocks M.D.   On: 05/21/2018 10:44   Dg Chest Port 1 View  Result Date: 05/21/2018 CLINICAL DATA:  Acute onset of respiratory failure. EXAM: PORTABLE CHEST 1 VIEW COMPARISON:  Chest radiograph performed 05/20/2018 FINDINGS: The lungs are well-aerated. Small bilateral pleural effusions are noted. Patchy bilateral airspace opacification is concerning for pneumonia, more focal than on the prior study. There is no evidence of pleural effusion or pneumothorax. The cardiomediastinal silhouette is mildly enlarged. No acute osseous abnormalities are seen. IMPRESSION: 1. Small bilateral pleural effusions noted. Patchy bilateral airspace opacification is concerning for pneumonia, more focal than on the prior study. Followup PA and lateral chest X-ray is recommended in 3-4 weeks following trial of antibiotic therapy to ensure resolution and exclude underlying malignancy. 2. Mild cardiomegaly. Electronically Signed   By: Roanna Raider M.D.   On: 05/21/2018 01:31   Dg Chest Port 1 View  Result Date: 05/20/2018 CLINICAL DATA:  Respiratory failure EXAM: PORTABLE CHEST 1 VIEW COMPARISON:  05/19/2018 FINDINGS: Moderate cardiomegaly with right-greater-than-left basilar predominant airspace opacity and right pleural effusion. Pulmonary edema suspected. IMPRESSION: Cardiomegaly, right pleural effusion and likely pulmonary edema, suggesting congestive heart failure. Electronically Signed   By: Deatra Robinson M.D.   On: 05/20/2018 04:49   Dg Chest Port 1 View  Result Date: 05/19/2018 CLINICAL DATA:  Onset of respiratory failure 1 hour prior to presentation. Patient has been transferred to the ICU. EXAM: PORTABLE CHEST 1 VIEW COMPARISON:  PA and lateral chest x-ray of May 19, 2018 FINDINGS: There is increased density at the right lung base  as compared to the earlier study today. There is patchy increased density lateral to the left heart border which is new as well. The upper lobes are clear. The cardiac silhouette remains enlarged. The central pulmonary vascularity is somewhat more engorged. IMPRESSION: Increased density at the right lung base and in the left mid and lower lung may reflect atelectasis or worsening of pneumonia given the sudden change since this morning. No overt pulmonary edema although there is mild central pulmonary vascular prominence and mild cardiomegaly. Electronically Signed   By: David  [place] M.D.   On: 05/19/2018 16:32     CBC Recent Labs  Lab 05/19/18 0819 05/20/18 0517  WBC 13.5* 8.8  HGB 15.6 12.8*  HCT 47.8 39.8  PLT 141* 127*  MCV 93.4 93.4  MCH 30.5 30.0  MCHC 32.6 32.2  RDW 12.5 12.7  LYMPHSABS 0.6*  --   MONOABS 0.8  --   EOSABS 0.0  --   BASOSABS 0.0  --     Chemistries  Recent Labs  Lab 05/19/18 0819 05/20/18 0517 05/21/18 0847  NA 137 137 133*  K 4.0 3.3* 3.5  CL 101 106 99  CO2 26 24 24   GLUCOSE 211* 131* 244*  BUN 17 14 13   CREATININE 1.37* 1.01 1.03  CALCIUM 9.0 7.9* 8.2*  AST 32  --   --   ALT 47*  --   --  ALKPHOS 69  --   --   BILITOT 1.5*  --   --    ------------------------------------------------------------------------------------------------------------------ estimated creatinine clearance is 112.3 mL/min (by C-G formula based on SCr of 1.03 mg/dL). ------------------------------------------------------------------------------------------------------------------ No results for input(s): HGBA1C in the last 72 hours. ------------------------------------------------------------------------------------------------------------------ No results for input(s): CHOL, HDL, LDLCALC, TRIG, CHOLHDL, LDLDIRECT in the last 72 hours. ------------------------------------------------------------------------------------------------------------------ No results for  input(s): TSH, T4TOTAL, T3FREE, THYROIDAB in the last 72 hours.  Invalid input(s): FREET3 ------------------------------------------------------------------------------------------------------------------ No results for input(s): VITAMINB12, FOLATE, FERRITIN, TIBC, IRON, RETICCTPCT in the last 72 hours.  Coagulation profile No results for input(s): INR, PROTIME in the last 168 hours.  No results for input(s): DDIMER in the last 72 hours.  Cardiac Enzymes Recent Labs  Lab 05/19/18 0819  TROPONINI 0.03*   ------------------------------------------------------------------------------------------------------------------ Invalid input(s): POCBNP    Assessment & Plan   Patient is a 43 year old with acute respiratory failure   1.    Acute respiratory failure CT scan showed worsening bilateral infiltrates This is due to community-acquired pneumonia possible fluid overload Continue IV ceftriaxone azithromycin Wean oxygen as tolerated Echocardiogram of the heart shows no significant wall motion abnormality Clinically improving Sputum with gram-positive cocci discussed with intensivist and will continue same antibiotics  2.  GERD continue PPI   3.  Miscellaneous Lovenox for DVT prophylaxis     Code Status Orders  (From admission, onward)         Start     Ordered   05/19/18 1526  Full code  Continuous     05/19/18 1525        Code Status History    This patient has a current code status but no historical code status.    Advance Directive Documentation     Most Recent Value  Type of Advance Directive  Healthcare Power of Attorney  Pre-existing out of facility DNR order (yellow form or pink MOST form)  -  "MOST" Form in Place?  -           Consults intense  DVT Prophylaxis  Lovenox  Lab Results  Component Value Date   PLT 127 (L) 05/20/2018     Time Spent in minutes   35 minutes greater than 50% of time spent in care coordination and counseling  patient regarding the condition and plan of care.   Auburn BilberryShreyang Shamirah Ivan M.D on 05/22/2018 at 5:05 PM  Between 7am to 6pm - Pager - [phone]  After 6pm go to [email or web address] - Social research officer, governmentpassword [email or web address]  [employer]   Office  [phone]

## 2018-05-22 NOTE — Progress Notes (Signed)
Pharmacy Antibiotic Note  Brett [name] is a 43 y.o. male admitted on 05/19/2018 with pneumonia.  Patient admitted through the ED and initially transferred to the floor on azithromycin and ceftriaxone. Patients condition further declined and patient transferred to the ICU. Pharmacy has been consulted for vancomycin and cefepime dosing. Patient is to remain on azithromycin 500mg  IV Q24hr x 5 days total. Vancomycin discontinued during rounds on 01/02.   Plan: Continue Cefepime 1g IV Q8hr.     Height: 6' (182.9 cm) Weight: 207 lb 0.2 oz (93.9 kg) IBW/kg (Calculated) : 77.6  Temp (24hrs), Avg:98.6 F (37 C), Min:98.3 F (36.8 C), Max:99.3 F (37.4 C)  Recent Labs  Lab 05/19/18 0819 05/19/18 0836 05/19/18 1111 05/20/18 0517 05/21/18 0847 05/21/18 1001  WBC 13.5*  --   --  8.8  --   --   CREATININE 1.37*  --   --  1.01 1.03  --   LATICACIDVEN  --  2.5* 1.3  --   --   --   VANCOTROUGH  --   --   --   --   --  7*    Estimated Creatinine Clearance: 112.3 mL/min (by C-G formula based on SCr of 1.03 mg/dL).    Allergies  Allergen Reactions  . Pantoprazole Hives  . Penicillins Hives    Antimicrobials this admission: Ceftriaxone 12/13 x 1  Azithromycin 12/31  >> 01/04 Cefepime 12/31 >>  Vancomycin 12/31 >> 01/02  Dose adjustments this admission: N/A  Microbiology results: 12/31 BCx: NG TD  12/31 Sputum: Rare GPC  12/31 MRSA PCR: negative    Thank you for allowing pharmacy to be a part of this patient's care.  Gardner Candle, PharmD, BCPS Clinical Pharmacist 05/22/2018 8:43 AM

## 2018-05-22 NOTE — Progress Notes (Signed)
Follow up - Critical Care Medicine Note  Patient Details:    Brett [name] is an 43 y.o. male.with a past medical history remarkable for acid reflux and speech impediment presented to the emergency room complaining of cough, increasing shortness of breath, fever and sputum production.  Was seen in emergency department initially had a negative flu test and was subsequently discharged home.  Represented with progressive increasing shortness of breath and admitted to the floor for community-acquired pneumonia.  Initially started on azithromycin and Rocephin.  Has been developing worsening hypoxemic respiratory failure and has been subsequently transferred to the intensive care unit.    Lines, Airways, Drains:    Anti-infectives:  Anti-infectives (From admission, onward)   Start     Dose/Rate Route Frequency Ordered Stop   05/22/18 1200  azithromycin (ZITHROMAX) tablet 500 mg     500 mg Oral Daily 05/22/18 0842 05/24/18 1159   05/20/18 1000  azithromycin (ZITHROMAX) 500 mg in sodium chloride 0.9 % 250 mL IVPB  Status:  Discontinued     500 mg 250 mL/hr over 60 Minutes Intravenous Every 24 hours 05/19/18 1728 05/22/18 0842   05/19/18 2300  vancomycin (VANCOCIN) 1,250 mg in sodium chloride 0.9 % 250 mL IVPB  Status:  Discontinued     1,250 mg 166.7 mL/hr over 90 Minutes Intravenous Every 12 hours 05/19/18 1706 05/21/18 1020   05/19/18 1630  ceFEPIme (MAXIPIME) 1 g in sodium chloride 0.9 % 100 mL IVPB     1 g 200 mL/hr over 30 Minutes Intravenous Every 8 hours 05/19/18 1623     05/19/18 1630  vancomycin (VANCOCIN) IVPB 1000 mg/200 mL premix     1,000 mg 200 mL/hr over 60 Minutes Intravenous  Once 05/19/18 1623 05/19/18 1836   05/19/18 0845  cefTRIAXone (ROCEPHIN) 2 g in sodium chloride 0.9 % 100 mL IVPB  Status:  Discontinued     2 g 200 mL/hr over 30 Minutes Intravenous Every 24 hours 05/19/18 0830 05/19/18 1608   05/19/18 0845  azithromycin (ZITHROMAX) 500 mg in sodium chloride 0.9 % 250 mL  IVPB  Status:  Discontinued     500 mg 250 mL/hr over 60 Minutes Intravenous Every 24 hours 05/19/18 0830 05/19/18 1728      Microbiology: Results for orders placed or performed during the hospital encounter of 05/19/18  Blood Culture (routine x 2)     Status: None (Preliminary result)   Collection Time: 05/19/18  8:36 AM  Result Value Ref Range Status   Specimen Description BLOOD RIGHT ANTECUBITAL  Final   Special Requests   Final    BOTTLES DRAWN AEROBIC AND ANAEROBIC Blood Culture adequate volume   Culture   Final    NO GROWTH 2 DAYS Performed at [facility], [address], [place], [place] [address]    Report Status PENDING  Incomplete  Blood Culture (routine x 2)     Status: None (Preliminary result)   Collection Time: 05/19/18  8:39 AM  Result Value Ref Range Status   Specimen Description BLOOD LEFT AC  Final   Special Requests   Final    BOTTLES DRAWN AEROBIC AND ANAEROBIC Blood Culture results may not be optimal due to an inadequate volume of blood received in culture bottles   Culture   Final    NO GROWTH 2 DAYS Performed at [facility], [address], [place], [place] [address]    Report Status PENDING  Incomplete  MRSA PCR Screening     Status:  None   Collection Time: 05/19/18  4:18 PM  Result Value Ref Range Status   MRSA by PCR NEGATIVE NEGATIVE Final    Comment:        The GeneXpert MRSA Assay (FDA approved for NASAL specimens only), is one component of a comprehensive MRSA colonization surveillance program. It is not intended to diagnose MRSA infection nor to guide or monitor treatment for MRSA infections. Performed at [facility], [address] [place], [place] [address]   Culture, sputum-assessment     Status: None   Collection Time: 05/19/18  7:26 PM  Result Value Ref Range Status   Specimen Description SPUTUM  Final   Special Requests NONE  Final   Sputum evaluation   Final    THIS SPECIMEN IS ACCEPTABLE FOR  SPUTUM CULTURE Performed at [facility], [address] [place], [place] [address]    Report Status 05/19/2018 FINAL  Final  Culture, respiratory     Status: None (Preliminary result)   Collection Time: 05/19/18  7:26 PM  Result Value Ref Range Status   Specimen Description   Final    SPUTUM Performed at [facility], [address] [place], [place] [address]    Special Requests   Final    NONE Reflexed from [id number] Performed at [facility], [address] [place], [place] [address]    Gram Stain   Final    ABUNDANT WBC PRESENT,BOTH PMN AND MONONUCLEAR RARE GRAM POSITIVE COCCI    Culture   Final    CULTURE REINCUBATED FOR BETTER GROWTH Performed at [facility], [address] [place], [place] [address]    Report Status PENDING  Incomplete    Studies: Dg Chest 2 View  Result Date: 05/19/2018 CLINICAL DATA:  Cough and fever; shortness of breath EXAM: CHEST - 2 VIEW COMPARISON:  January 22, 2018 FINDINGS: There is patchy airspace opacity in the lung bases, slightly more on the left than on the right. Lungs elsewhere are clear. Heart is borderline enlarged with slight pulmonary venous hypertension. No adenopathy. No bone lesions. IMPRESSION: Patchy infiltrate in the bases, felt to represent pneumonia. Pulmonary vascular congestion. Electronically Signed   By: Bretta BangWilliam  Woodruff III M.D.   On: 05/19/2018 08:19   Ct Chest Wo Contrast  Result Date: 05/21/2018 CLINICAL DATA:  43 year old with worsening hypoxemic respiratory failure. Community-acquired pneumonia on antibiotics. EXAM: CT CHEST WITHOUT CONTRAST TECHNIQUE: Multidetector CT imaging of the chest was performed following the standard protocol without IV contrast. COMPARISON:  Prior radiographs, most recently done today. FINDINGS: Cardiovascular: No significant vascular findings identified on noncontrast imaging. The heart is mildly enlarged. No significant pericardial effusion.  Mediastinum/Nodes: There are no enlarged mediastinal, hilar or axillary lymph nodes.There is a 9 mm AP window node on image 47/2. Hilar assessment is limited by the lack of intravenous contrast. The thyroid gland, trachea and esophagus demonstrate no significant findings. Lungs/Pleura: Trace pleural fluid bilaterally. There is extensive lower lobe consolidation with air bronchograms bilaterally. Additional patchy airspace and ground-glass opacities are present in both upper lobes and in the right middle lobe. No focal mass or endobronchial lesion identified. Upper abdomen: Borderline hepatic steatosis without focal abnormality on noncontrast imaging. Musculoskeletal/Chest wall: There is no chest wall mass or suspicious osseous finding. IMPRESSION: 1. Multifocal ground-glass and airspace opacities in both lungs with areas of consolidation in both lower lobes and the right middle lobe, most consistent with multilobar pneumonia. These findings appear mildly progressive on recent chest  radiographs. 2. No significant pleural effusion, lung mass or significant adenopathy identified. Electronically Signed   By: Carey Bullocks M.D.   On: 05/21/2018 10:44   Dg Chest Port 1 View  Result Date: 05/21/2018 CLINICAL DATA:  Acute onset of respiratory failure. EXAM: PORTABLE CHEST 1 VIEW COMPARISON:  Chest radiograph performed 05/20/2018 FINDINGS: The lungs are well-aerated. Small bilateral pleural effusions are noted. Patchy bilateral airspace opacification is concerning for pneumonia, more focal than on the prior study. There is no evidence of pleural effusion or pneumothorax. The cardiomediastinal silhouette is mildly enlarged. No acute osseous abnormalities are seen. IMPRESSION: 1. Small bilateral pleural effusions noted. Patchy bilateral airspace opacification is concerning for pneumonia, more focal than on the prior study. Followup PA and lateral chest X-ray is recommended in 3-4 weeks following trial of antibiotic  therapy to ensure resolution and exclude underlying malignancy. 2. Mild cardiomegaly. Electronically Signed   By: Roanna Raider M.D.   On: 05/21/2018 01:31   Dg Chest Port 1 View  Result Date: 05/20/2018 CLINICAL DATA:  Respiratory failure EXAM: PORTABLE CHEST 1 VIEW COMPARISON:  05/19/2018 FINDINGS: Moderate cardiomegaly with right-greater-than-left basilar predominant airspace opacity and right pleural effusion. Pulmonary edema suspected. IMPRESSION: Cardiomegaly, right pleural effusion and likely pulmonary edema, suggesting congestive heart failure. Electronically Signed   By: Deatra Robinson M.D.   On: 05/20/2018 04:49   Dg Chest Port 1 View  Result Date: 05/19/2018 CLINICAL DATA:  Onset of respiratory failure 1 hour prior to presentation. Patient has been transferred to the ICU. EXAM: PORTABLE CHEST 1 VIEW COMPARISON:  PA and lateral chest x-ray of May 19, 2018 FINDINGS: There is increased density at the right lung base as compared to the earlier study today. There is patchy increased density lateral to the left heart border which is new as well. The upper lobes are clear. The cardiac silhouette remains enlarged. The central pulmonary vascularity is somewhat more engorged. IMPRESSION: Increased density at the right lung base and in the left mid and lower lung may reflect atelectasis or worsening of pneumonia given the sudden change since this morning. No overt pulmonary edema although there is mild central pulmonary vascular prominence and mild cardiomegaly. Electronically Signed   By: David  [place] M.D.   On: 05/19/2018 16:32    Consults:    Subjective:    Overnight Issues: Patient had a good day yesterday, weaned FiO2 down to 55%.  CT scan of the chest performed revealed diffuse multi lobar pneumonia without a significant amount of effusion in need of tap Legionella studies negative, negative pneumococcus in the urine antigen  Objective:  Vital signs for last 24 hours: Temp:  [98.3 F  (36.8 C)-99.3 F (37.4 C)] 98.3 F (36.8 C) (01/03 0745) Pulse Rate:  [78-106] 86 (01/03 0800) Resp:  [12-42] 31 (01/03 0800) BP: (104-131)/(57-83) 131/83 (01/03 0800) SpO2:  [91 %-99 %] 93 % (01/03 0800) FiO2 (%):  [60 %-70 %] 60 % (01/03 0800)  Hemodynamic parameters for last 24 hours:    Intake/Output from previous day: 01/02 0701 - 01/03 0700 In: 805.9 [P.O.:384; IV Piggyback:421.9] Out: 1625 [Urine:1625]  Intake/Output this shift: No intake/output data recorded.  Vent settings for last 24 hours: FiO2 (%):  [60 %-70 %] 60 %  Physical Exam:  Patient is awake, alert, no acute distress.  Is saturating at 95% on the monitor Vital signs:       Please see the above listed vital signs HEENT:  Trachea midline, no thyromegaly appreciated, no oral lesions appreciated, no jugular venous distention, accessory muscle utilization noted Cardiovascular:           Tachycardia sinus mechanism noted on monitor Pulmonary:      Crackles appreciated diffusely Abdominal:      Positive bowel sounds, soft exam Extremities:     No clubbing, cyanosis or edema noted Neurologic:      Cranial nerves are grossly intact without any focal deficits appreciated Cutaneous:      No rashes or lesions noted  Assessment/Plan:   Multilobar pneumonia with respiratory failure.  Patient with clinical improvement today.  Decrease secretions.  Presently on azithromycin and Maxipime.  Thus far pneumococcal antigen and Legionella antigen negative, MRSA screen negative, sputum showing rare gram-positive otherwise negative.  We will continue albuterol, Atrovent as needed, wean high flow oxygen as tolerated down to 55%  Hyponatremia.  Saline replacement  Hyperglycemia.  Scale coverage  Brett KindredJohn Krrish Freund, DO  Brett [name] 05/22/2018  *Care during the described time interval was provided by me and/or other providers on the critical care team.  I have reviewed this patient's available data, including medical  history, events of note, physical examination and test results as part of my evaluation. Patient ID: Brett [name], male   DOB: 11/24/1975, 43 y.o.   MRN: [id number] Patient ID: Brett [name], male   DOB: 07/09/1975, 43 y.o.   MRN: [id number]

## 2018-05-23 ENCOUNTER — Inpatient Hospital Stay: Payer: BLUE CROSS/BLUE SHIELD

## 2018-05-23 LAB — CBC WITH DIFFERENTIAL/PLATELET
Abs Immature Granulocytes: 1.18 10*3/uL — ABNORMAL HIGH (ref 0.00–0.07)
BASOS ABS: 0 10*3/uL (ref 0.0–0.1)
Basophils Relative: 0 %
Eosinophils Absolute: 0 10*3/uL (ref 0.0–0.5)
Eosinophils Relative: 0 %
HCT: 41 % (ref 39.0–52.0)
Hemoglobin: 13.3 g/dL (ref 13.0–17.0)
Immature Granulocytes: 11 %
Lymphocytes Relative: 9 %
Lymphs Abs: 0.9 10*3/uL (ref 0.7–4.0)
MCH: 29.8 pg (ref 26.0–34.0)
MCHC: 32.4 g/dL (ref 30.0–36.0)
MCV: 91.7 fL (ref 80.0–100.0)
MONOS PCT: 11 %
Monocytes Absolute: 1.2 10*3/uL — ABNORMAL HIGH (ref 0.1–1.0)
Neutro Abs: 7 10*3/uL (ref 1.7–7.7)
Neutrophils Relative %: 69 %
Platelets: 228 10*3/uL (ref 150–400)
RBC: 4.47 MIL/uL (ref 4.22–5.81)
RDW: 12.9 % (ref 11.5–15.5)
SMEAR REVIEW: NORMAL
WBC: 10.4 10*3/uL (ref 4.0–10.5)
nRBC: 0 % (ref 0.0–0.2)

## 2018-05-23 LAB — BASIC METABOLIC PANEL
Anion gap: 9 (ref 5–15)
BUN: 14 mg/dL (ref 6–20)
CO2: 28 mmol/L (ref 22–32)
Calcium: 8.2 mg/dL — ABNORMAL LOW (ref 8.9–10.3)
Chloride: 97 mmol/L — ABNORMAL LOW (ref 98–111)
Creatinine, Ser: 0.72 mg/dL (ref 0.61–1.24)
GFR calc Af Amer: >60 mL/min (ref >60)
GFR calc non Af Amer: >60 mL/min (ref >60)
Glucose, Bld: 138 mg/dL — ABNORMAL HIGH (ref 70–99)
Potassium: 3.4 mmol/L — ABNORMAL LOW (ref 3.5–5.1)
Sodium: 134 mmol/L — ABNORMAL LOW (ref 135–145)

## 2018-05-23 LAB — LEGIONELLA PNEUMOPHILA SEROGP 1 UR AG: L. pneumophila Serogp 1 Ur Ag: NEGATIVE

## 2018-05-23 MED ORDER — IBUPROFEN 400 MG PO TABS
600.0000 mg | ORAL_TABLET | Freq: Four times a day (QID) | ORAL | Status: DC | PRN
  Administered 2018-05-23 – 2018-05-25 (×3): 600 mg via ORAL
  Filled 2018-05-23 (×3): qty 2

## 2018-05-23 NOTE — Progress Notes (Signed)
[employer] - [place] at [facility]                                                                                                                                                                                  Patient Demographics   Brett [name], is a 43 y.o. male, DOB - 07/13/1975, UEA:[id number]  Admit date - 05/19/2018   Admitting Physician Auburn BilberryShreyang Patel, MD  Outpatient Primary MD for the patient is Patient, No Pcp Per   LOS - 4  Subjective: Patient states he is feeling pretty good today.  Has a good appetite and is doing well with eating breakfast.  Endorsing some right-sided chest pain that is worse with coughing.  Review of Systems:   CONSTITUTIONAL: No documented fever. No fatigue, weakness. No weight gain, no weight loss.  EYES: No blurry or double vision.  ENT: No tinnitus. No postnasal drip. No redness of the oropharynx.  RESPIRATORY: Positive cough, positive wheeze, no hemoptysis.  Positive dyspnea.  CARDIOVASCULAR: +chest wall pain. No orthopnea. No palpitations. No syncope.  GASTROINTESTINAL: No nausea, no vomiting or diarrhea. No abdominal pain. No melena or hematochezia.  GENITOURINARY: No dysuria or hematuria.  ENDOCRINE: No polyuria or nocturia. No heat or cold intolerance.  HEMATOLOGY: No anemia. No bruising. No bleeding.  INTEGUMENTARY: No rashes. No lesions.  MUSCULOSKELETAL: No arthritis. No swelling. No gout.  NEUROLOGIC: No numbness, tingling, or ataxia. No seizure-type activity.  PSYCHIATRIC: No anxiety. No insomnia. No ADD.    Vitals:   Vitals:   05/23/18 1500 05/23/18 1600 05/23/18 1657 05/23/18 1808  BP: 127/84 133/86  122/73  Pulse: 72 78 85 90  Resp: 19 (!) 28 (!) 31 (!) 30  Temp:   99 F (37.2 C)   TempSrc:   Axillary   SpO2: 95% 94% 93% 91%  Weight:      Height:        Wt Readings from Last 3 Encounters:  05/19/18 93.9 kg  05/17/18 93.9 kg  01/21/18 95.7 kg     Intake/Output Summary (Last 24 hours) at 05/23/2018  1826 Last data filed at 05/23/2018 1809 Gross per 24 hour  Intake 460 ml  Output 2650 ml  Net -2190 ml    Physical Exam:   GENERAL: Pleasant-appearing in no apparent distress.  HEAD, EYES, EARS, NOSE AND THROAT: Atraumatic, normocephalic. Extraocular muscles are intact. Pupils equal and reactive to light. Sclerae anicteric. No conjunctival injection. No oro-pharyngeal erythema.  NECK: Supple. There is no jugular venous distention. No bruits, no lymphadenopathy, no thyromegaly.  HEART: Regular rate and rhythm,. No murmurs, no rubs, no clicks.  LUNGS: + Diffuse rhonchi bilaterally, nasal cannula in place, able to  speak in full sentences ABDOMEN: Soft, flat, nontender, nondistended. Has good bowel sounds. No hepatosplenomegaly appreciated.  EXTREMITIES: No evidence of any cyanosis, clubbing, or peripheral edema.  +2 pedal and radial pulses bilaterally.  NEUROLOGIC: The patient is alert, awake, and oriented x3 with no focal motor or sensory deficits appreciated bilaterally.  SKIN: Moist and warm with no rashes appreciated.  Psych: Not anxious, depressed LN: No inguinal LN enlargement    Antibiotics   Anti-infectives (From admission, onward)   Start     Dose/Rate Route Frequency Ordered Stop   05/22/18 1200  azithromycin (ZITHROMAX) tablet 500 mg     500 mg Oral Daily 05/22/18 0842 05/23/18 1214   05/22/18 1200  cefTRIAXone (ROCEPHIN) 2 g in sodium chloride 0.9 % 100 mL IVPB     2 g 200 mL/hr over 30 Minutes Intravenous Every 24 hours 05/22/18 1028     05/20/18 1000  azithromycin (ZITHROMAX) 500 mg in sodium chloride 0.9 % 250 mL IVPB  Status:  Discontinued     500 mg 250 mL/hr over 60 Minutes Intravenous Every 24 hours 05/19/18 1728 05/22/18 0842   05/19/18 2300  vancomycin (VANCOCIN) 1,250 mg in sodium chloride 0.9 % 250 mL IVPB  Status:  Discontinued     1,250 mg 166.7 mL/hr over 90 Minutes Intravenous Every 12 hours 05/19/18 1706 05/21/18 1020   05/19/18 1630  ceFEPIme (MAXIPIME) 1  g in sodium chloride 0.9 % 100 mL IVPB  Status:  Discontinued     1 g 200 mL/hr over 30 Minutes Intravenous Every 8 hours 05/19/18 1623 05/22/18 1028   05/19/18 1630  vancomycin (VANCOCIN) IVPB 1000 mg/200 mL premix     1,000 mg 200 mL/hr over 60 Minutes Intravenous  Once 05/19/18 1623 05/19/18 1836   05/19/18 0845  cefTRIAXone (ROCEPHIN) 2 g in sodium chloride 0.9 % 100 mL IVPB  Status:  Discontinued     2 g 200 mL/hr over 30 Minutes Intravenous Every 24 hours 05/19/18 0830 05/19/18 1608   05/19/18 0845  azithromycin (ZITHROMAX) 500 mg in sodium chloride 0.9 % 250 mL IVPB  Status:  Discontinued     500 mg 250 mL/hr over 60 Minutes Intravenous Every 24 hours 05/19/18 0830 05/19/18 1728      Medications   Scheduled Meds: . docusate sodium  100 mg Oral BID  . enoxaparin (LOVENOX) injection  40 mg Subcutaneous Q24H  . famotidine  20 mg Oral QAC breakfast  . guaiFENesin  600 mg Oral BID  . ipratropium-albuterol  3 mL Nebulization Q4H   Continuous Infusions: . cefTRIAXone (ROCEPHIN)  IV 2 g (05/23/18 1218)   PRN Meds:.acetaminophen **OR** acetaminophen, alum & mag hydroxide-simeth, guaiFENesin-codeine, ibuprofen, ipratropium-albuterol, morphine injection, ondansetron **OR** ondansetron (ZOFRAN) IV, oxyCODONE-acetaminophen   Data Review:   Micro Results Recent Results (from the past 240 hour(s))  Blood Culture (routine x 2)     Status: None (Preliminary result)   Collection Time: 05/19/18  8:36 AM  Result Value Ref Range Status   Specimen Description BLOOD RIGHT ANTECUBITAL  Final   Special Requests   Final    BOTTLES DRAWN AEROBIC AND ANAEROBIC Blood Culture adequate volume   Culture   Final    NO GROWTH 4 DAYS Performed at [facility], [address] [place], [place] [address]    Report Status PENDING  Incomplete  Blood Culture (routine x 2)     Status: None (Preliminary result)   Collection Time: 05/19/18  8:39 AM  Result Value Ref  Range Status   Specimen  Description BLOOD LEFT AC  Final   Special Requests   Final    BOTTLES DRAWN AEROBIC AND ANAEROBIC Blood Culture results may not be optimal due to an inadequate volume of blood received in culture bottles   Culture   Final    NO GROWTH 4 DAYS Performed at [facility], [address] [place], [place] [address]    Report Status PENDING  Incomplete  MRSA PCR Screening     Status: None   Collection Time: 05/19/18  4:18 PM  Result Value Ref Range Status   MRSA by PCR NEGATIVE NEGATIVE Final    Comment:        The GeneXpert MRSA Assay (FDA approved for NASAL specimens only), is one component of a comprehensive MRSA colonization surveillance program. It is not intended to diagnose MRSA infection nor to guide or monitor treatment for MRSA infections. Performed at [facility], [address] [place], [place] [address]   Culture, sputum-assessment     Status: None   Collection Time: 05/19/18  7:26 PM  Result Value Ref Range Status   Specimen Description SPUTUM  Final   Special Requests NONE  Final   Sputum evaluation   Final    THIS SPECIMEN IS ACCEPTABLE FOR SPUTUM CULTURE Performed at [facility], [address], [place], [place] [address]    Report Status 05/19/2018 FINAL  Final  Culture, respiratory     Status: None   Collection Time: 05/19/18  7:26 PM  Result Value Ref Range Status   Specimen Description   Final    SPUTUM Performed at [facility], [address] [place], [place] [address]    Special Requests   Final    NONE Reflexed from [id number] Performed at [facility], [address] [place], [place] [address]    Gram Stain   Final    ABUNDANT WBC PRESENT,BOTH PMN AND MONONUCLEAR RARE GRAM POSITIVE COCCI    Culture   Final    FEW Consistent with normal respiratory flora. Performed at [facility], [address], [place], [place] [address]    Report Status 05/22/2018 FINAL  Final    Radiology  Reports Dg Chest 2 View  Result Date: 05/19/2018 CLINICAL DATA:  Cough and fever; shortness of breath EXAM: CHEST - 2 VIEW COMPARISON:  January 22, 2018 FINDINGS: There is patchy airspace opacity in the lung bases, slightly more on the left than on the right. Lungs elsewhere are clear. Heart is borderline enlarged with slight pulmonary venous hypertension. No adenopathy. No bone lesions. IMPRESSION: Patchy infiltrate in the bases, felt to represent pneumonia. Pulmonary vascular congestion. Electronically Signed   By: Bretta Bang III M.D.   On: 05/19/2018 08:19   Ct Chest Wo Contrast  Result Date: 05/21/2018 CLINICAL DATA:  43 year old with worsening hypoxemic respiratory failure. Community-acquired pneumonia on antibiotics. EXAM: CT CHEST WITHOUT CONTRAST TECHNIQUE: Multidetector CT imaging of the chest was performed following the standard protocol without IV contrast. COMPARISON:  Prior radiographs, most recently done today. FINDINGS: Cardiovascular: No significant vascular findings identified on noncontrast imaging. The heart is mildly enlarged. No significant pericardial effusion. Mediastinum/Nodes: There are no enlarged mediastinal, hilar or axillary lymph nodes.There is a 9 mm AP window node on image 47/2. Hilar assessment is limited by the lack of intravenous contrast. The thyroid gland, trachea and esophagus demonstrate no significant findings. Lungs/Pleura: Trace pleural fluid bilaterally. There is extensive lower lobe consolidation with  air bronchograms bilaterally. Additional patchy airspace and ground-glass opacities are present in both upper lobes and in the right middle lobe. No focal mass or endobronchial lesion identified. Upper abdomen: Borderline hepatic steatosis without focal abnormality on noncontrast imaging. Musculoskeletal/Chest wall: There is no chest wall mass or suspicious osseous finding. IMPRESSION: 1. Multifocal ground-glass and airspace opacities in both lungs with areas  of consolidation in both lower lobes and the right middle lobe, most consistent with multilobar pneumonia. These findings appear mildly progressive on recent chest radiographs. 2. No significant pleural effusion, lung mass or significant adenopathy identified. Electronically Signed   By: Carey Bullocks M.D.   On: 05/21/2018 10:44   Dg Chest Port 1 View  Result Date: 05/23/2018 CLINICAL DATA:  Pneumonia EXAM: PORTABLE CHEST 1 VIEW COMPARISON:  05/21/2018 FINDINGS: Moderate cardiomegaly. Diffuse but predominately central and basilar airspace disease. Small right pleural effusion. No pneumothorax. IMPRESSION: Bilateral airspace disease is nonspecific. Consider ARDS, pulmonary edema, or bilateral pneumonia. Small right pleural effusion. Electronically Signed   By: Jolaine Click M.D.   On: 05/23/2018 11:04   Dg Chest Port 1 View  Result Date: 05/21/2018 CLINICAL DATA:  Acute onset of respiratory failure. EXAM: PORTABLE CHEST 1 VIEW COMPARISON:  Chest radiograph performed 05/20/2018 FINDINGS: The lungs are well-aerated. Small bilateral pleural effusions are noted. Patchy bilateral airspace opacification is concerning for pneumonia, more focal than on the prior study. There is no evidence of pleural effusion or pneumothorax. The cardiomediastinal silhouette is mildly enlarged. No acute osseous abnormalities are seen. IMPRESSION: 1. Small bilateral pleural effusions noted. Patchy bilateral airspace opacification is concerning for pneumonia, more focal than on the prior study. Followup PA and lateral chest X-ray is recommended in 3-4 weeks following trial of antibiotic therapy to ensure resolution and exclude underlying malignancy. 2. Mild cardiomegaly. Electronically Signed   By: Roanna Raider M.D.   On: 05/21/2018 01:31   Dg Chest Port 1 View  Result Date: 05/20/2018 CLINICAL DATA:  Respiratory failure EXAM: PORTABLE CHEST 1 VIEW COMPARISON:  05/19/2018 FINDINGS: Moderate cardiomegaly with right-greater-than-left  basilar predominant airspace opacity and right pleural effusion. Pulmonary edema suspected. IMPRESSION: Cardiomegaly, right pleural effusion and likely pulmonary edema, suggesting congestive heart failure. Electronically Signed   By: Deatra Robinson M.D.   On: 05/20/2018 04:49   Dg Chest Port 1 View  Result Date: 05/19/2018 CLINICAL DATA:  Onset of respiratory failure 1 hour prior to presentation. Patient has been transferred to the ICU. EXAM: PORTABLE CHEST 1 VIEW COMPARISON:  PA and lateral chest x-ray of May 19, 2018 FINDINGS: There is increased density at the right lung base as compared to the earlier study today. There is patchy increased density lateral to the left heart border which is new as well. The upper lobes are clear. The cardiac silhouette remains enlarged. The central pulmonary vascularity is somewhat more engorged. IMPRESSION: Increased density at the right lung base and in the left mid and lower lung may reflect atelectasis or worsening of pneumonia given the sudden change since this morning. No overt pulmonary edema although there is mild central pulmonary vascular prominence and mild cardiomegaly. Electronically Signed   By: David  [place] M.D.   On: 05/19/2018 16:32     CBC Recent Labs  Lab 05/19/18 0819 05/20/18 0517 05/23/18 1049  WBC 13.5* 8.8 10.4  HGB 15.6 12.8* 13.3  HCT 47.8 39.8 41.0  PLT 141* 127* 228  MCV 93.4 93.4 91.7  MCH 30.5 30.0 29.8  MCHC 32.6 32.2 32.4  RDW  12.5 12.7 12.9  LYMPHSABS 0.6*  --  0.9  MONOABS 0.8  --  1.2*  EOSABS 0.0  --  0.0  BASOSABS 0.0  --  0.0    Chemistries  Recent Labs  Lab 05/19/18 0819 05/20/18 0517 05/21/18 0847 05/23/18 1049  NA 137 137 133* 134*  K 4.0 3.3* 3.5 3.4*  CL 101 106 99 97*  CO2 26 24 24 28   GLUCOSE 211* 131* 244* 138*  BUN 17 14 13 14   CREATININE 1.37* 1.01 1.03 0.72  CALCIUM 9.0 7.9* 8.2* 8.2*  AST 32  --   --   --   ALT 47*  --   --   --   ALKPHOS 69  --   --   --   BILITOT 1.5*  --   --    --    ------------------------------------------------------------------------------------------------------------------ estimated creatinine clearance is 144.5 mL/min (by C-G formula based on SCr of 0.72 mg/dL). ------------------------------------------------------------------------------------------------------------------ No results for input(s): HGBA1C in the last 72 hours. ------------------------------------------------------------------------------------------------------------------ No results for input(s): CHOL, HDL, LDLCALC, TRIG, CHOLHDL, LDLDIRECT in the last 72 hours. ------------------------------------------------------------------------------------------------------------------ No results for input(s): TSH, T4TOTAL, T3FREE, THYROIDAB in the last 72 hours.  Invalid input(s): FREET3 ------------------------------------------------------------------------------------------------------------------ No results for input(s): VITAMINB12, FOLATE, FERRITIN, TIBC, IRON, RETICCTPCT in the last 72 hours.  Coagulation profile No results for input(s): INR, PROTIME in the last 168 hours.  No results for input(s): DDIMER in the last 72 hours.  Cardiac Enzymes Recent Labs  Lab 05/19/18 0819  TROPONINI 0.03*   ------------------------------------------------------------------------------------------------------------------ Invalid input(s): POCBNP    Assessment & Plan   Acute respiratory failure- secondary to multilevel lobar pneumonia.  Improving.  Currently on 4 L O2 by nasal cannula. -Continue IV ceftriaxone azithromycin -Wean oxygen as tolerated -Continue Duonebs as needed  Chest wall pain- right-sided, likely pleuritic chest pain -Motrin and Percocet as needed -Tentative spirometry  GERD- stable -Continue PPI      Code Status Orders  (From admission, onward)         Start     Ordered   05/19/18 1526  Full code  Continuous     05/19/18 1525        Code  Status History    This patient has a current code status but no historical code status.    Advance Directive Documentation     Most Recent Value  Type of Advance Directive  Healthcare Power of Attorney  Pre-existing out of facility DNR order (yellow form or pink MOST form)  -  "MOST" Form in Place?  -     Consults CCM  DVT Prophylaxis  Lovenox  Lab Results  Component Value Date   PLT 228 05/23/2018     Time Spent in minutes   35 minutes greater than 50% of time spent in care coordination and counseling patient regarding the condition and plan of care.   Jinny BlossomKaty D Yer Castello M.D on 05/23/2018 at 6:26 PM  Between 7am to 6pm - Pager [phone]- [phone]  After 6pm go to [email or web address] - Social research officer, governmentpassword [email or web address]  [employer]   Office  [phone]

## 2018-05-23 NOTE — Progress Notes (Signed)
Was able to wean patient off HFNC to 4 liters [place]- tolerating well all day- Oxygen sats in mid 90's.  Patient up to chair for approximately 2 hrs today.  Complaint of pain on right side of rib from his coughing- pain medication helping with pain- vitals stable.

## 2018-05-23 NOTE — Progress Notes (Signed)
Follow up - Critical Care Medicine Note  Patient Details:    Brett [name] is an 43 y.o. male.with a past medical history remarkable for acid reflux and speech impediment presented to the emergency room complaining of cough, increasing shortness of breath, fever and sputum production.  Was seen in emergency department initially had a negative flu test and was subsequently discharged home.  Represented with progressive increasing shortness of breath and admitted to the floor for community-acquired pneumonia.  Initially started on azithromycin and Rocephin.  Has been developing worsening hypoxemic respiratory failure and has been subsequently transferred to the intensive care unit.    Lines, Airways, Drains:    Anti-infectives:  Anti-infectives (From admission, onward)   Start     Dose/Rate Route Frequency Ordered Stop   05/22/18 1200  azithromycin (ZITHROMAX) tablet 500 mg     500 mg Oral Daily 05/22/18 0842 05/24/18 1159   05/22/18 1200  cefTRIAXone (ROCEPHIN) 2 g in sodium chloride 0.9 % 100 mL IVPB     2 g 200 mL/hr over 30 Minutes Intravenous Every 24 hours 05/22/18 1028     05/20/18 1000  azithromycin (ZITHROMAX) 500 mg in sodium chloride 0.9 % 250 mL IVPB  Status:  Discontinued     500 mg 250 mL/hr over 60 Minutes Intravenous Every 24 hours 05/19/18 1728 05/22/18 0842   05/19/18 2300  vancomycin (VANCOCIN) 1,250 mg in sodium chloride 0.9 % 250 mL IVPB  Status:  Discontinued     1,250 mg 166.7 mL/hr over 90 Minutes Intravenous Every 12 hours 05/19/18 1706 05/21/18 1020   05/19/18 1630  ceFEPIme (MAXIPIME) 1 g in sodium chloride 0.9 % 100 mL IVPB  Status:  Discontinued     1 g 200 mL/hr over 30 Minutes Intravenous Every 8 hours 05/19/18 1623 05/22/18 1028   05/19/18 1630  vancomycin (VANCOCIN) IVPB 1000 mg/200 mL premix     1,000 mg 200 mL/hr over 60 Minutes Intravenous  Once 05/19/18 1623 05/19/18 1836   05/19/18 0845  cefTRIAXone (ROCEPHIN) 2 g in sodium chloride 0.9 % 100 mL IVPB   Status:  Discontinued     2 g 200 mL/hr over 30 Minutes Intravenous Every 24 hours 05/19/18 0830 05/19/18 1608   05/19/18 0845  azithromycin (ZITHROMAX) 500 mg in sodium chloride 0.9 % 250 mL IVPB  Status:  Discontinued     500 mg 250 mL/hr over 60 Minutes Intravenous Every 24 hours 05/19/18 0830 05/19/18 1728      Microbiology: Results for orders placed or performed during the hospital encounter of 05/19/18  Blood Culture (routine x 2)     Status: None (Preliminary result)   Collection Time: 05/19/18  8:36 AM  Result Value Ref Range Status   Specimen Description BLOOD RIGHT ANTECUBITAL  Final   Special Requests   Final    BOTTLES DRAWN AEROBIC AND ANAEROBIC Blood Culture adequate volume   Culture   Final    NO GROWTH 4 DAYS Performed at [facility], [address] [place], [place] [id number]    Report Status PENDING  Incomplete  Blood Culture (routine x 2)     Status: None (Preliminary result)   Collection Time: 05/19/18  8:39 AM  Result Value Ref Range Status   Specimen Description BLOOD LEFT AC  Final   Special Requests   Final    BOTTLES DRAWN AEROBIC AND ANAEROBIC Blood Culture results may not be optimal due to an inadequate volume of blood received in culture bottles  Culture   Final    NO GROWTH 4 DAYS Performed at [facility], [address] [place], [place] [address]    Report Status PENDING  Incomplete  MRSA PCR Screening     Status: None   Collection Time: 05/19/18  4:18 PM  Result Value Ref Range Status   MRSA by PCR NEGATIVE NEGATIVE Final    Comment:        The GeneXpert MRSA Assay (FDA approved for NASAL specimens only), is one component of a comprehensive MRSA colonization surveillance program. It is not intended to diagnose MRSA infection nor to guide or monitor treatment for MRSA infections. Performed at [facility], [address] [place], [place] [address]   Culture, sputum-assessment     Status: None    Collection Time: 05/19/18  7:26 PM  Result Value Ref Range Status   Specimen Description SPUTUM  Final   Special Requests NONE  Final   Sputum evaluation   Final    THIS SPECIMEN IS ACCEPTABLE FOR SPUTUM CULTURE Performed at [facility], [address], [place], [place] [address]    Report Status 05/19/2018 FINAL  Final  Culture, respiratory     Status: None   Collection Time: 05/19/18  7:26 PM  Result Value Ref Range Status   Specimen Description   Final    SPUTUM Performed at [facility], [address], [place], [place] [address]    Special Requests   Final    NONE Reflexed from [id number] Performed at [facility], [address] [place], [place] [address]    Gram Stain   Final    ABUNDANT WBC PRESENT,BOTH PMN AND MONONUCLEAR RARE GRAM POSITIVE COCCI    Culture   Final    FEW Consistent with normal respiratory flora. Performed at [facility], [address], [place], [place] [address]    Report Status 05/22/2018 FINAL  Final    Studies: Dg Chest 2 View  Result Date: 05/19/2018 CLINICAL DATA:  Cough and fever; shortness of breath EXAM: CHEST - 2 VIEW COMPARISON:  January 22, 2018 FINDINGS: There is patchy airspace opacity in the lung bases, slightly more on the left than on the right. Lungs elsewhere are clear. Heart is borderline enlarged with slight pulmonary venous hypertension. No adenopathy. No bone lesions. IMPRESSION: Patchy infiltrate in the bases, felt to represent pneumonia. Pulmonary vascular congestion. Electronically Signed   By: Bretta Bang III M.D.   On: 05/19/2018 08:19   Ct Chest Wo Contrast  Result Date: 05/21/2018 CLINICAL DATA:  43 year old with worsening hypoxemic respiratory failure. Community-acquired pneumonia on antibiotics. EXAM: CT CHEST WITHOUT CONTRAST TECHNIQUE: Multidetector CT imaging of the chest was performed following the standard protocol without IV contrast. COMPARISON:  Prior radiographs, most  recently done today. FINDINGS: Cardiovascular: No significant vascular findings identified on noncontrast imaging. The heart is mildly enlarged. No significant pericardial effusion. Mediastinum/Nodes: There are no enlarged mediastinal, hilar or axillary lymph nodes.There is a 9 mm AP window node on image 47/2. Hilar assessment is limited by the lack of intravenous contrast. The thyroid gland, trachea and esophagus demonstrate no significant findings. Lungs/Pleura: Trace pleural fluid bilaterally. There is extensive lower lobe consolidation with air bronchograms bilaterally. Additional patchy airspace and ground-glass opacities are present in both upper lobes and in the right middle lobe. No focal mass or endobronchial lesion identified. Upper abdomen: Borderline hepatic steatosis without focal abnormality on noncontrast imaging. Musculoskeletal/Chest wall: There is no chest wall mass  or suspicious osseous finding. IMPRESSION: 1. Multifocal ground-glass and airspace opacities in both lungs with areas of consolidation in both lower lobes and the right middle lobe, most consistent with multilobar pneumonia. These findings appear mildly progressive on recent chest radiographs. 2. No significant pleural effusion, lung mass or significant adenopathy identified. Electronically Signed   By: Carey Bullocks M.D.   On: 05/21/2018 10:44   Dg Chest Port 1 View  Result Date: 05/21/2018 CLINICAL DATA:  Acute onset of respiratory failure. EXAM: PORTABLE CHEST 1 VIEW COMPARISON:  Chest radiograph performed 05/20/2018 FINDINGS: The lungs are well-aerated. Small bilateral pleural effusions are noted. Patchy bilateral airspace opacification is concerning for pneumonia, more focal than on the prior study. There is no evidence of pleural effusion or pneumothorax. The cardiomediastinal silhouette is mildly enlarged. No acute osseous abnormalities are seen. IMPRESSION: 1. Small bilateral pleural effusions noted. Patchy bilateral  airspace opacification is concerning for pneumonia, more focal than on the prior study. Followup PA and lateral chest X-ray is recommended in 3-4 weeks following trial of antibiotic therapy to ensure resolution and exclude underlying malignancy. 2. Mild cardiomegaly. Electronically Signed   By: Roanna Raider M.D.   On: 05/21/2018 01:31   Dg Chest Port 1 View  Result Date: 05/20/2018 CLINICAL DATA:  Respiratory failure EXAM: PORTABLE CHEST 1 VIEW COMPARISON:  05/19/2018 FINDINGS: Moderate cardiomegaly with right-greater-than-left basilar predominant airspace opacity and right pleural effusion. Pulmonary edema suspected. IMPRESSION: Cardiomegaly, right pleural effusion and likely pulmonary edema, suggesting congestive heart failure. Electronically Signed   By: Deatra Robinson M.D.   On: 05/20/2018 04:49   Dg Chest Port 1 View  Result Date: 05/19/2018 CLINICAL DATA:  Onset of respiratory failure 1 hour prior to presentation. Patient has been transferred to the ICU. EXAM: PORTABLE CHEST 1 VIEW COMPARISON:  PA and lateral chest x-ray of May 19, 2018 FINDINGS: There is increased density at the right lung base as compared to the earlier study today. There is patchy increased density lateral to the left heart border which is new as well. The upper lobes are clear. The cardiac silhouette remains enlarged. The central pulmonary vascularity is somewhat more engorged. IMPRESSION: Increased density at the right lung base and in the left mid and lower lung may reflect atelectasis or worsening of pneumonia given the sudden change since this morning. No overt pulmonary edema although there is mild central pulmonary vascular prominence and mild cardiomegaly. Electronically Signed   By: David  [place] M.D.   On: 05/19/2018 16:32    Consults:    Subjective:    Overnight Issues: Patient has been able to wean FiO2 down to 38%.  Is complaining of pleuritic type right costal pain.  He states is limited in his breathing  secondary to pain  Objective:  Vital signs for last 24 hours: Temp:  [98.2 F (36.8 C)-99.5 F (37.5 C)] 99 F (37.2 C) (01/04 0832) Pulse Rate:  [72-97] 87 (01/04 0832) Resp:  [19-41] 32 (01/04 0832) BP: (113-139)/(64-85) 114/76 (01/04 0600) SpO2:  [90 %-98 %] 93 % (01/04 0832) FiO2 (%):  [38 %-50 %] 40 % (01/04 0725)  Hemodynamic parameters for last 24 hours:    Intake/Output from previous day: 01/03 0701 - 01/04 0700 In: 440 [P.O.:240; IV Piggyback:200] Out: 3275 [Urine:3275]  Intake/Output this shift: No intake/output data recorded.  Vent settings for last 24 hours: FiO2 (%):  [38 %-50 %] 40 %  Physical Exam:  Patient is awake, alert, no acute distress.  Is saturating at 95%  on the monitor Vital signs:       Please see the above listed vital signs HEENT:           Trachea midline, no thyromegaly appreciated, no oral lesions appreciated, no jugular venous distention, no accessory muscle utilization noted Cardiovascular:            Regular rate and rhythm Pulmonary:       Frequently improved exam much clear Abdominal:      Positive bowel sounds, soft exam Extremities:     No clubbing, cyanosis or edema noted Neurologic:      Cranial nerves are grossly intact without any focal deficits appreciated Cutaneous:      No rashes or lesions noted  Assessment/Plan:   Multilobar pneumonia with respiratory failure.  Have been able to wean down FiO2 to 38%.  Does have pleuritic type chest pain.  Will provide Motrin along with opiate and encourage incentive spirometry.  Will repeat chest x-ray today to make sure there is been no change.  We will continue azithromycin and Rocephin, albuterol, Atrovent.  We will recheck blood work today  [employer], DO  Jerson Furukawa 05/23/2018  *Care during the described time interval was provided by me and/or other providers on the critical care team.  I have reviewed this patient's available data, including medical history, events of note,  physical examination and test results as part of my evaluation. Patient ID: Marlow Guard, male   DOB: 01/19/1976, 43 y.o.   MRN: [id number] Patient ID: Calub Bailin, male   DOB: Oct 27, 1975, 43 y.o.   MRN: [id number] Patient ID: Daishawn Salama, male   DOB: [birth year], 43 y.o.   MRN: [id number]

## 2018-05-24 LAB — CULTURE, BLOOD (ROUTINE X 2)
Culture: NO GROWTH
Culture: NO GROWTH
Special Requests: ADEQUATE

## 2018-05-24 LAB — BASIC METABOLIC PANEL
Anion gap: 8 (ref 5–15)
BUN: 14 mg/dL (ref 6–20)
CO2: 29 mmol/L (ref 22–32)
Calcium: 8.2 mg/dL — ABNORMAL LOW (ref 8.9–10.3)
Chloride: 97 mmol/L — ABNORMAL LOW (ref 98–111)
Creatinine, Ser: 0.81 mg/dL (ref 0.61–1.24)
GFR calc non Af Amer: >60 mL/min (ref >60)
Glucose, Bld: 124 mg/dL — ABNORMAL HIGH (ref 70–99)
Potassium: 3.3 mmol/L — ABNORMAL LOW (ref 3.5–5.1)
SODIUM: 134 mmol/L — AB (ref 135–145)

## 2018-05-24 LAB — MAGNESIUM: Magnesium: 2.2 mg/dL (ref 1.7–2.4)

## 2018-05-24 MED ORDER — POTASSIUM CHLORIDE CRYS ER 20 MEQ PO TBCR
40.0000 meq | EXTENDED_RELEASE_TABLET | Freq: Once | ORAL | Status: AC
  Administered 2018-05-24: 40 meq via ORAL
  Filled 2018-05-24: qty 2

## 2018-05-24 MED ORDER — SODIUM CHLORIDE 0.9 % IV SOLN
2.0000 g | INTRAVENOUS | Status: AC
  Administered 2018-05-25: 12:00:00 2 g via INTRAVENOUS
  Filled 2018-05-24: qty 20
  Filled 2018-05-24: qty 2
  Filled 2018-05-24: qty 20

## 2018-05-24 NOTE — Progress Notes (Signed)
Pt going to 1C-Rm 120. Report given to [facility]. Pt A&O X4. Sinus rhythm. On 4L [place]. Regular diet. Use urinal, urine OP 1250cc. Bottom pink foam changed. q6h Percocet, morphine for pain, and codeine for cough.

## 2018-05-24 NOTE — Progress Notes (Signed)
Pharmacy Electrolyte Monitoring Consult:  Pharmacy consulted to assist in monitoring and replacing electrolytes in this 43 y.o. male admitted on 05/19/2018 with Cough and Shortness of Breath   Labs:  Sodium (mmol/L)  Date Value  05/24/2018 134 (L)   Potassium (mmol/L)  Date Value  05/24/2018 3.3 (L)   Magnesium (mg/dL)  Date Value  29/06/1113 2.2   Calcium (mg/dL)  Date Value  52/12/221 8.2 (L)   Albumin (g/dL)  Date Value  36/04/2448 3.9    Assessment/Plan: Will order potassium PO x 1.   Will obtain BMP with am labs.   Pharmacy will continue to monitor and adjust per consult.   Brett [name] L 05/24/2018 1:41 PM

## 2018-05-24 NOTE — Progress Notes (Addendum)
[employer] - [place] at [facility]                                                                                                                                                                                  Patient Demographics   Brett [name], is a 43 y.o. male, DOB - 09/28/1975, ZOX:[id number]  Admit date - 05/19/2018   Admitting Physician Auburn Bilberry, MD  Outpatient Primary MD for the patient is Patient, No Pcp Per   LOS - 5  Subjective: Patient states he is feeling much better today.  His right-sided chest pain has resolved.  He feels like his breathing is better every day.  Review of Systems:   CONSTITUTIONAL: No documented fever. No fatigue, weakness. No weight gain, no weight loss.  EYES: No blurry or double vision.  ENT: No tinnitus. No postnasal drip. No redness of the oropharynx.  RESPIRATORY: Positive cough, positive wheeze, no hemoptysis.  Positive dyspnea.  CARDIOVASCULAR: No chest pain. No orthopnea. No palpitations. No syncope.  GASTROINTESTINAL: No nausea, no vomiting or diarrhea. No abdominal pain. No melena or hematochezia.  GENITOURINARY: No dysuria or hematuria.  ENDOCRINE: No polyuria or nocturia. No heat or cold intolerance.  HEMATOLOGY: No anemia. No bruising. No bleeding.  INTEGUMENTARY: No rashes. No lesions.  MUSCULOSKELETAL: No arthritis. No swelling. No gout.  NEUROLOGIC: No numbness, tingling, or ataxia. No seizure-type activity.  PSYCHIATRIC: No anxiety. No insomnia. No ADD.    Vitals:   Vitals:   05/24/18 1400 05/24/18 1500 05/24/18 1600 05/24/18 1601  BP: 137/72 127/78 (!) 121/58   Pulse: 96 97 98 96  Resp: (!) 35 (!) 28 (!) 29 (!) 23  Temp:   98.6 F (37 C)   TempSrc:   Oral   SpO2: 95% 93% 94% 96%  Weight:      Height:        Wt Readings from Last 3 Encounters:  05/19/18 93.9 kg  05/17/18 93.9 kg  01/21/18 95.7 kg     Intake/Output Summary (Last 24 hours) at 05/24/2018 1640 Last data filed at 05/24/2018  1400 Gross per 24 hour  Intake 760 ml  Output 1900 ml  Net -1140 ml    Physical Exam:   GENERAL: Pleasant-appearing in no apparent distress.  HEAD, EYES, EARS, NOSE AND THROAT: Atraumatic, normocephalic. Extraocular muscles are intact. Pupils equal and reactive to light. Sclerae anicteric. No conjunctival injection. No oro-pharyngeal erythema.  NECK: Supple. There is no jugular venous distention. No bruits, no lymphadenopathy, no thyromegaly.  HEART: Regular rate and rhythm,. No murmurs, no rubs, no clicks.  LUNGS: + Diffuse rhonchi bilaterally, nasal cannula in place, able to speak in full sentences  ABDOMEN: Soft, flat, nontender, nondistended. Has good bowel sounds. No hepatosplenomegaly appreciated.  EXTREMITIES: No evidence of any cyanosis, clubbing, or peripheral edema.  +2 pedal and radial pulses bilaterally.  NEUROLOGIC: The patient is alert, awake, and oriented x3 with no focal motor or sensory deficits appreciated bilaterally.  SKIN: Moist and warm with no rashes appreciated.  Psych: Not anxious, depressed LN: No inguinal LN enlargement    Antibiotics   Anti-infectives (From admission, onward)   Start     Dose/Rate Route Frequency Ordered Stop   05/25/18 1000  cefTRIAXone (ROCEPHIN) 2 g in sodium chloride 0.9 % 100 mL IVPB     2 g 200 mL/hr over 30 Minutes Intravenous Every 24 hours 05/24/18 1340     05/22/18 1200  azithromycin (ZITHROMAX) tablet 500 mg     500 mg Oral Daily 05/22/18 0842 05/23/18 1214   05/22/18 1200  cefTRIAXone (ROCEPHIN) 2 g in sodium chloride 0.9 % 100 mL IVPB  Status:  Discontinued     2 g 200 mL/hr over 30 Minutes Intravenous Every 24 hours 05/22/18 1028 05/24/18 1340   05/20/18 1000  azithromycin (ZITHROMAX) 500 mg in sodium chloride 0.9 % 250 mL IVPB  Status:  Discontinued     500 mg 250 mL/hr over 60 Minutes Intravenous Every 24 hours 05/19/18 1728 05/22/18 0842   05/19/18 2300  vancomycin (VANCOCIN) 1,250 mg in sodium chloride 0.9 % 250 mL  IVPB  Status:  Discontinued     1,250 mg 166.7 mL/hr over 90 Minutes Intravenous Every 12 hours 05/19/18 1706 05/21/18 1020   05/19/18 1630  ceFEPIme (MAXIPIME) 1 g in sodium chloride 0.9 % 100 mL IVPB  Status:  Discontinued     1 g 200 mL/hr over 30 Minutes Intravenous Every 8 hours 05/19/18 1623 05/22/18 1028   05/19/18 1630  vancomycin (VANCOCIN) IVPB 1000 mg/200 mL premix     1,000 mg 200 mL/hr over 60 Minutes Intravenous  Once 05/19/18 1623 05/19/18 1836   05/19/18 0845  cefTRIAXone (ROCEPHIN) 2 g in sodium chloride 0.9 % 100 mL IVPB  Status:  Discontinued     2 g 200 mL/hr over 30 Minutes Intravenous Every 24 hours 05/19/18 0830 05/19/18 1608   05/19/18 0845  azithromycin (ZITHROMAX) 500 mg in sodium chloride 0.9 % 250 mL IVPB  Status:  Discontinued     500 mg 250 mL/hr over 60 Minutes Intravenous Every 24 hours 05/19/18 0830 05/19/18 1728      Medications   Scheduled Meds: . docusate sodium  100 mg Oral BID  . enoxaparin (LOVENOX) injection  40 mg Subcutaneous Q24H  . famotidine  20 mg Oral QAC breakfast  . guaiFENesin  600 mg Oral BID  . ipratropium-albuterol  3 mL Nebulization Q4H   Continuous Infusions: . [START ON 05/25/2018] cefTRIAXone (ROCEPHIN)  IV     PRN Meds:.acetaminophen **OR** acetaminophen, alum & mag hydroxide-simeth, guaiFENesin-codeine, ibuprofen, ipratropium-albuterol, morphine injection, ondansetron **OR** ondansetron (ZOFRAN) IV, oxyCODONE-acetaminophen   Data Review:   Micro Results Recent Results (from the past 240 hour(s))  Blood Culture (routine x 2)     Status: None   Collection Time: 05/19/18  8:36 AM  Result Value Ref Range Status   Specimen Description BLOOD RIGHT ANTECUBITAL  Final   Special Requests   Final    BOTTLES DRAWN AEROBIC AND ANAEROBIC Blood Culture adequate volume   Culture   Final    NO GROWTH 5 DAYS Performed at [facility], [address], [place], [place]  [id number]    Report Status 05/24/2018 FINAL  Final   Blood Culture (routine x 2)     Status: None   Collection Time: 05/19/18  8:39 AM  Result Value Ref Range Status   Specimen Description BLOOD LEFT AC  Final   Special Requests   Final    BOTTLES DRAWN AEROBIC AND ANAEROBIC Blood Culture results may not be optimal due to an inadequate volume of blood received in culture bottles   Culture   Final    NO GROWTH 5 DAYS Performed at [facility], [address] [place], [place] [address]    Report Status 05/24/2018 FINAL  Final  MRSA PCR Screening     Status: None   Collection Time: 05/19/18  4:18 PM  Result Value Ref Range Status   MRSA by PCR NEGATIVE NEGATIVE Final    Comment:        The GeneXpert MRSA Assay (FDA approved for NASAL specimens only), is one component of a comprehensive MRSA colonization surveillance program. It is not intended to diagnose MRSA infection nor to guide or monitor treatment for MRSA infections. Performed at [facility], [address] [place], [place] [address]   Culture, sputum-assessment     Status: None   Collection Time: 05/19/18  7:26 PM  Result Value Ref Range Status   Specimen Description SPUTUM  Final   Special Requests NONE  Final   Sputum evaluation   Final    THIS SPECIMEN IS ACCEPTABLE FOR SPUTUM CULTURE Performed at [facility], [address] [place], [place] [address]    Report Status 05/19/2018 FINAL  Final  Culture, respiratory     Status: None   Collection Time: 05/19/18  7:26 PM  Result Value Ref Range Status   Specimen Description   Final    SPUTUM Performed at [facility], [address] [place], [place] [address]    Special Requests   Final    NONE Reflexed from [id number] Performed at [facility], [address] [place], [place] [address]    Gram Stain   Final    ABUNDANT WBC PRESENT,BOTH PMN AND MONONUCLEAR RARE GRAM POSITIVE COCCI    Culture   Final    FEW Consistent with normal respiratory flora. Performed  at [facility], [address] [place], [place] [address]    Report Status 05/22/2018 FINAL  Final    Radiology Reports Dg Chest 2 View  Result Date: 05/19/2018 CLINICAL DATA:  Cough and fever; shortness of breath EXAM: CHEST - 2 VIEW COMPARISON:  January 22, 2018 FINDINGS: There is patchy airspace opacity in the lung bases, slightly more on the left than on the right. Lungs elsewhere are clear. Heart is borderline enlarged with slight pulmonary venous hypertension. No adenopathy. No bone lesions. IMPRESSION: Patchy infiltrate in the bases, felt to represent pneumonia. Pulmonary vascular congestion. Electronically Signed   By: Bretta BangWilliam  Woodruff III M.D.   On: 05/19/2018 08:19   Ct Chest Wo Contrast  Result Date: 05/21/2018 CLINICAL DATA:  43 year old with worsening hypoxemic respiratory failure. Community-acquired pneumonia on antibiotics. EXAM: CT CHEST WITHOUT CONTRAST TECHNIQUE: Multidetector CT imaging of the chest was performed following the standard protocol without IV contrast. COMPARISON:  Prior radiographs, most recently done today. FINDINGS: Cardiovascular: No significant vascular findings identified on noncontrast imaging. The heart is mildly enlarged. No significant pericardial effusion. Mediastinum/Nodes: There are no enlarged mediastinal, hilar or axillary lymph nodes.There is a 9 mm AP window node  on image 47/2. Hilar assessment is limited by the lack of intravenous contrast. The thyroid gland, trachea and esophagus demonstrate no significant findings. Lungs/Pleura: Trace pleural fluid bilaterally. There is extensive lower lobe consolidation with air bronchograms bilaterally. Additional patchy airspace and ground-glass opacities are present in both upper lobes and in the right middle lobe. No focal mass or endobronchial lesion identified. Upper abdomen: Borderline hepatic steatosis without focal abnormality on noncontrast imaging. Musculoskeletal/Chest wall: There is no chest wall  mass or suspicious osseous finding. IMPRESSION: 1. Multifocal ground-glass and airspace opacities in both lungs with areas of consolidation in both lower lobes and the right middle lobe, most consistent with multilobar pneumonia. These findings appear mildly progressive on recent chest radiographs. 2. No significant pleural effusion, lung mass or significant adenopathy identified. Electronically Signed   By: Carey Bullocks M.D.   On: 05/21/2018 10:44   Dg Chest Port 1 View  Result Date: 05/23/2018 CLINICAL DATA:  Pneumonia EXAM: PORTABLE CHEST 1 VIEW COMPARISON:  05/21/2018 FINDINGS: Moderate cardiomegaly. Diffuse but predominately central and basilar airspace disease. Small right pleural effusion. No pneumothorax. IMPRESSION: Bilateral airspace disease is nonspecific. Consider ARDS, pulmonary edema, or bilateral pneumonia. Small right pleural effusion. Electronically Signed   By: Jolaine Click M.D.   On: 05/23/2018 11:04   Dg Chest Port 1 View  Result Date: 05/21/2018 CLINICAL DATA:  Acute onset of respiratory failure. EXAM: PORTABLE CHEST 1 VIEW COMPARISON:  Chest radiograph performed 05/20/2018 FINDINGS: The lungs are well-aerated. Small bilateral pleural effusions are noted. Patchy bilateral airspace opacification is concerning for pneumonia, more focal than on the prior study. There is no evidence of pleural effusion or pneumothorax. The cardiomediastinal silhouette is mildly enlarged. No acute osseous abnormalities are seen. IMPRESSION: 1. Small bilateral pleural effusions noted. Patchy bilateral airspace opacification is concerning for pneumonia, more focal than on the prior study. Followup PA and lateral chest X-ray is recommended in 3-4 weeks following trial of antibiotic therapy to ensure resolution and exclude underlying malignancy. 2. Mild cardiomegaly. Electronically Signed   By: Roanna Raider M.D.   On: 05/21/2018 01:31   Dg Chest Port 1 View  Result Date: 05/20/2018 CLINICAL DATA:   Respiratory failure EXAM: PORTABLE CHEST 1 VIEW COMPARISON:  05/19/2018 FINDINGS: Moderate cardiomegaly with right-greater-than-left basilar predominant airspace opacity and right pleural effusion. Pulmonary edema suspected. IMPRESSION: Cardiomegaly, right pleural effusion and likely pulmonary edema, suggesting congestive heart failure. Electronically Signed   By: Deatra Robinson M.D.   On: 05/20/2018 04:49   Dg Chest Port 1 View  Result Date: 05/19/2018 CLINICAL DATA:  Onset of respiratory failure 1 hour prior to presentation. Patient has been transferred to the ICU. EXAM: PORTABLE CHEST 1 VIEW COMPARISON:  PA and lateral chest x-ray of May 19, 2018 FINDINGS: There is increased density at the right lung base as compared to the earlier study today. There is patchy increased density lateral to the left heart border which is new as well. The upper lobes are clear. The cardiac silhouette remains enlarged. The central pulmonary vascularity is somewhat more engorged. IMPRESSION: Increased density at the right lung base and in the left mid and lower lung may reflect atelectasis or worsening of pneumonia given the sudden change since this morning. No overt pulmonary edema although there is mild central pulmonary vascular prominence and mild cardiomegaly. Electronically Signed   By: David  [place] M.D.   On: 05/19/2018 16:32     CBC Recent Labs  Lab 05/19/18 0819 05/20/18 0517 05/23/18 1049  WBC  13.5* 8.8 10.4  HGB 15.6 12.8* 13.3  HCT 47.8 39.8 41.0  PLT 141* 127* 228  MCV 93.4 93.4 91.7  MCH 30.5 30.0 29.8  MCHC 32.6 32.2 32.4  RDW 12.5 12.7 12.9  LYMPHSABS 0.6*  --  0.9  MONOABS 0.8  --  1.2*  EOSABS 0.0  --  0.0  BASOSABS 0.0  --  0.0    Chemistries  Recent Labs  Lab 05/19/18 0819 05/20/18 0517 05/21/18 0847 05/23/18 1049 05/24/18 0658  NA 137 137 133* 134* 134*  K 4.0 3.3* 3.5 3.4* 3.3*  CL 101 106 99 97* 97*  CO2 26 24 24 28 29   GLUCOSE 211* 131* 244* 138* 124*  BUN 17 14 13  14 14   CREATININE 1.37* 1.01 1.03 0.72 0.81  CALCIUM 9.0 7.9* 8.2* 8.2* 8.2*  MG  --   --   --   --  2.2  AST 32  --   --   --   --   ALT 47*  --   --   --   --   ALKPHOS 69  --   --   --   --   BILITOT 1.5*  --   --   --   --    ------------------------------------------------------------------------------------------------------------------ estimated creatinine clearance is 142.8 mL/min (by C-G formula based on SCr of 0.81 mg/dL). ------------------------------------------------------------------------------------------------------------------ No results for input(s): HGBA1C in the last 72 hours. ------------------------------------------------------------------------------------------------------------------ No results for input(s): CHOL, HDL, LDLCALC, TRIG, CHOLHDL, LDLDIRECT in the last 72 hours. ------------------------------------------------------------------------------------------------------------------ No results for input(s): TSH, T4TOTAL, T3FREE, THYROIDAB in the last 72 hours.  Invalid input(s): FREET3 ------------------------------------------------------------------------------------------------------------------ No results for input(s): VITAMINB12, FOLATE, FERRITIN, TIBC, IRON, RETICCTPCT in the last 72 hours.  Coagulation profile No results for input(s): INR, PROTIME in the last 168 hours.  No results for input(s): DDIMER in the last 72 hours.  Cardiac Enzymes Recent Labs  Lab 05/19/18 0819  TROPONINI 0.03*   ------------------------------------------------------------------------------------------------------------------ Invalid input(s): POCBNP    Assessment & Plan   Acute respiratory failure- secondary to multilobar pneumonia.  Improving.  Currently on 4 L O2 by nasal cannula. -Continue IV ceftriaxone and azithromycin -Wean oxygen as tolerated -Continue Duonebs as needed -Stable for transfer to the floor today  Chest wall pain- right-sided, likely  pleuritic chest pain.  Has greatly improved. -Motrin and Percocet as needed -Incentive spirometry  Hypokalemia- K 3.3 -Replete and recheck  GERD- stable -Continue PPI      Code Status Orders  (From admission, onward)         Start     Ordered   05/19/18 1526  Full code  Continuous     05/19/18 1525        Code Status History    This patient has a current code status but no historical code status.    Advance Directive Documentation     Most Recent Value  Type of Advance Directive  Healthcare Power of Attorney  Pre-existing out of facility DNR order (yellow form or pink MOST form)  -  "MOST" Form in Place?  -     Consults CCM  DVT Prophylaxis  Lovenox  Lab Results  Component Value Date   PLT 228 05/23/2018    Time Spent in minutes   33 minutes greater than 50% of time spent in care coordination and counseling patient regarding the condition and plan of care.   Jinny BlossomKaty D Jaylee Freeze M.D on 05/24/2018 at 4:40 PM  Between 7am to 6pm - Pager -  [phone]  After 6pm go to [email or web address] - Social research officer, government  [employer]   Office  [phone]

## 2018-05-24 NOTE — Progress Notes (Signed)
Follow up - Critical Care Medicine Note  Patient Details:    Brett [name] is an 43 y.o. male.with a past medical history remarkable for acid reflux and speech impediment presented to the emergency room complaining of cough, increasing shortness of breath, fever and sputum production.  Was seen in emergency department initially had a negative flu test and was subsequently discharged home.  Represented with progressive increasing shortness of breath and admitted to the floor for community-acquired pneumonia.  Initially started on azithromycin and Rocephin.  Has been developing worsening hypoxemic respiratory failure and has been subsequently transferred to the intensive care unit.    Lines, Airways, Drains:    Anti-infectives:  Anti-infectives (From admission, onward)   Start     Dose/Rate Route Frequency Ordered Stop   05/22/18 1200  azithromycin (ZITHROMAX) tablet 500 mg     500 mg Oral Daily 05/22/18 0842 05/23/18 1214   05/22/18 1200  cefTRIAXone (ROCEPHIN) 2 g in sodium chloride 0.9 % 100 mL IVPB     2 g 200 mL/hr over 30 Minutes Intravenous Every 24 hours 05/22/18 1028     05/20/18 1000  azithromycin (ZITHROMAX) 500 mg in sodium chloride 0.9 % 250 mL IVPB  Status:  Discontinued     500 mg 250 mL/hr over 60 Minutes Intravenous Every 24 hours 05/19/18 1728 05/22/18 0842   05/19/18 2300  vancomycin (VANCOCIN) 1,250 mg in sodium chloride 0.9 % 250 mL IVPB  Status:  Discontinued     1,250 mg 166.7 mL/hr over 90 Minutes Intravenous Every 12 hours 05/19/18 1706 05/21/18 1020   05/19/18 1630  ceFEPIme (MAXIPIME) 1 g in sodium chloride 0.9 % 100 mL IVPB  Status:  Discontinued     1 g 200 mL/hr over 30 Minutes Intravenous Every 8 hours 05/19/18 1623 05/22/18 1028   05/19/18 1630  vancomycin (VANCOCIN) IVPB 1000 mg/200 mL premix     1,000 mg 200 mL/hr over 60 Minutes Intravenous  Once 05/19/18 1623 05/19/18 1836   05/19/18 0845  cefTRIAXone (ROCEPHIN) 2 g in sodium chloride 0.9 % 100 mL IVPB   Status:  Discontinued     2 g 200 mL/hr over 30 Minutes Intravenous Every 24 hours 05/19/18 0830 05/19/18 1608   05/19/18 0845  azithromycin (ZITHROMAX) 500 mg in sodium chloride 0.9 % 250 mL IVPB  Status:  Discontinued     500 mg 250 mL/hr over 60 Minutes Intravenous Every 24 hours 05/19/18 0830 05/19/18 1728      Microbiology: Results for orders placed or performed during the hospital encounter of 05/19/18  Blood Culture (routine x 2)     Status: None   Collection Time: 05/19/18  8:36 AM  Result Value Ref Range Status   Specimen Description BLOOD RIGHT ANTECUBITAL  Final   Special Requests   Final    BOTTLES DRAWN AEROBIC AND ANAEROBIC Blood Culture adequate volume   Culture   Final    NO GROWTH 5 DAYS Performed at [facility], [address] [place], [place] [address]    Report Status 05/24/2018 FINAL  Final  Blood Culture (routine x 2)     Status: None   Collection Time: 05/19/18  8:39 AM  Result Value Ref Range Status   Specimen Description BLOOD LEFT AC  Final   Special Requests   Final    BOTTLES DRAWN AEROBIC AND ANAEROBIC Blood Culture results may not be optimal due to an inadequate volume of blood received in culture bottles   Culture  Final    NO GROWTH 5 DAYS Performed at [facility], [address] [place], [place] [address]    Report Status 05/24/2018 FINAL  Final  MRSA PCR Screening     Status: None   Collection Time: 05/19/18  4:18 PM  Result Value Ref Range Status   MRSA by PCR NEGATIVE NEGATIVE Final    Comment:        The GeneXpert MRSA Assay (FDA approved for NASAL specimens only), is one component of a comprehensive MRSA colonization surveillance program. It is not intended to diagnose MRSA infection nor to guide or monitor treatment for MRSA infections. Performed at [facility], [address] [place], [place] [address]   Culture, sputum-assessment     Status: None   Collection Time: 05/19/18  7:26 PM   Result Value Ref Range Status   Specimen Description SPUTUM  Final   Special Requests NONE  Final   Sputum evaluation   Final    THIS SPECIMEN IS ACCEPTABLE FOR SPUTUM CULTURE Performed at [facility], [address], [place], [place] [address]    Report Status 05/19/2018 FINAL  Final  Culture, respiratory     Status: None   Collection Time: 05/19/18  7:26 PM  Result Value Ref Range Status   Specimen Description   Final    SPUTUM Performed at [facility], [address] [place], [place] [address]    Special Requests   Final    NONE Reflexed from [id number] Performed at [facility], [address] [place], [place] [address]    Gram Stain   Final    ABUNDANT WBC PRESENT,BOTH PMN AND MONONUCLEAR RARE GRAM POSITIVE COCCI    Culture   Final    FEW Consistent with normal respiratory flora. Performed at [facility], [address] [place], [place] [address]    Report Status 05/22/2018 FINAL  Final    Studies: Dg Chest 2 View  Result Date: 05/19/2018 CLINICAL DATA:  Cough and fever; shortness of breath EXAM: CHEST - 2 VIEW COMPARISON:  January 22, 2018 FINDINGS: There is patchy airspace opacity in the lung bases, slightly more on the left than on the right. Lungs elsewhere are clear. Heart is borderline enlarged with slight pulmonary venous hypertension. No adenopathy. No bone lesions. IMPRESSION: Patchy infiltrate in the bases, felt to represent pneumonia. Pulmonary vascular congestion. Electronically Signed   By: Bretta Bang III M.D.   On: 05/19/2018 08:19   Ct Chest Wo Contrast  Result Date: 05/21/2018 CLINICAL DATA:  43 year old with worsening hypoxemic respiratory failure. Community-acquired pneumonia on antibiotics. EXAM: CT CHEST WITHOUT CONTRAST TECHNIQUE: Multidetector CT imaging of the chest was performed following the standard protocol without IV contrast. COMPARISON:  Prior radiographs, most recently done today. FINDINGS:  Cardiovascular: No significant vascular findings identified on noncontrast imaging. The heart is mildly enlarged. No significant pericardial effusion. Mediastinum/Nodes: There are no enlarged mediastinal, hilar or axillary lymph nodes.There is a 9 mm AP window node on image 47/2. Hilar assessment is limited by the lack of intravenous contrast. The thyroid gland, trachea and esophagus demonstrate no significant findings. Lungs/Pleura: Trace pleural fluid bilaterally. There is extensive lower lobe consolidation with air bronchograms bilaterally. Additional patchy airspace and ground-glass opacities are present in both upper lobes and in the right middle lobe. No focal mass or endobronchial lesion identified. Upper abdomen: Borderline hepatic steatosis without focal abnormality on noncontrast imaging. Musculoskeletal/Chest wall: There is no chest wall mass or suspicious  osseous finding. IMPRESSION: 1. Multifocal ground-glass and airspace opacities in both lungs with areas of consolidation in both lower lobes and the right middle lobe, most consistent with multilobar pneumonia. These findings appear mildly progressive on recent chest radiographs. 2. No significant pleural effusion, lung mass or significant adenopathy identified. Electronically Signed   By: Carey BullocksWilliam  Veazey M.D.   On: 05/21/2018 10:44   Dg Chest Port 1 View  Result Date: 05/23/2018 CLINICAL DATA:  Pneumonia EXAM: PORTABLE CHEST 1 VIEW COMPARISON:  05/21/2018 FINDINGS: Moderate cardiomegaly. Diffuse but predominately central and basilar airspace disease. Small right pleural effusion. No pneumothorax. IMPRESSION: Bilateral airspace disease is nonspecific. Consider ARDS, pulmonary edema, or bilateral pneumonia. Small right pleural effusion. Electronically Signed   By: Jolaine ClickArthur  Hoss M.D.   On: 05/23/2018 11:04   Dg Chest Port 1 View  Result Date: 05/21/2018 CLINICAL DATA:  Acute onset of respiratory failure. EXAM: PORTABLE CHEST 1 VIEW COMPARISON:  Chest  radiograph performed 05/20/2018 FINDINGS: The lungs are well-aerated. Small bilateral pleural effusions are noted. Patchy bilateral airspace opacification is concerning for pneumonia, more focal than on the prior study. There is no evidence of pleural effusion or pneumothorax. The cardiomediastinal silhouette is mildly enlarged. No acute osseous abnormalities are seen. IMPRESSION: 1. Small bilateral pleural effusions noted. Patchy bilateral airspace opacification is concerning for pneumonia, more focal than on the prior study. Followup PA and lateral chest X-ray is recommended in 3-4 weeks following trial of antibiotic therapy to ensure resolution and exclude underlying malignancy. 2. Mild cardiomegaly. Electronically Signed   By: Roanna RaiderJeffery  Chang M.D.   On: 05/21/2018 01:31   Dg Chest Port 1 View  Result Date: 05/20/2018 CLINICAL DATA:  Respiratory failure EXAM: PORTABLE CHEST 1 VIEW COMPARISON:  05/19/2018 FINDINGS: Moderate cardiomegaly with right-greater-than-left basilar predominant airspace opacity and right pleural effusion. Pulmonary edema suspected. IMPRESSION: Cardiomegaly, right pleural effusion and likely pulmonary edema, suggesting congestive heart failure. Electronically Signed   By: Deatra RobinsonKevin  Herman M.D.   On: 05/20/2018 04:49   Dg Chest Port 1 View  Result Date: 05/19/2018 CLINICAL DATA:  Onset of respiratory failure 1 hour prior to presentation. Patient has been transferred to the ICU. EXAM: PORTABLE CHEST 1 VIEW COMPARISON:  PA and lateral chest x-ray of May 19, 2018 FINDINGS: There is increased density at the right lung base as compared to the earlier study today. There is patchy increased density lateral to the left heart border which is new as well. The upper lobes are clear. The cardiac silhouette remains enlarged. The central pulmonary vascularity is somewhat more engorged. IMPRESSION: Increased density at the right lung base and in the left mid and lower lung may reflect atelectasis  or worsening of pneumonia given the sudden change since this morning. No overt pulmonary edema although there is mild central pulmonary vascular prominence and mild cardiomegaly. Electronically Signed   By: David  SwazilandJordan M.D.   On: 05/19/2018 16:32     Subjective:    Overnight Issues: Patient is significantly improved.  Pain has resolved.  Sitting up on nasal cannula stable oxygen saturations no complaints.  Starting to decrease the amount of secretions he is coughing up.  Objective:  Vital signs for last 24 hours: Temp:  [97.9 F (36.6 C)-99 F (37.2 C)] 98.2 F (36.8 C) (01/04 2000) Pulse Rate:  [72-101] 98 (01/05 0740) Resp:  [15-43] 26 (01/05 0740) BP: (113-140)/(70-86) 134/82 (01/05 0600) SpO2:  [90 %-97 %] 94 % (01/05 0740)  Hemodynamic parameters for last 24 hours:  Intake/Output from previous day: 01/04 0701 - 01/05 0700 In: 440 [P.O.:240; IV Piggyback:200] Out: 2000 [Urine:2000]  Intake/Output this shift: No intake/output data recorded.  Vent settings for last 24 hours:    Physical Exam:  Patient is awake, alert, no acute distress.  Is saturating at 95% on the monitor Vital signs:       Please see the above listed vital signs HEENT:           Trachea midline, no thyromegaly appreciated, no oral lesions appreciated, no jugular venous distention, no accessory muscle utilization noted Cardiovascular:            Regular rate and rhythm Pulmonary:       Frequently improved exam much clear Abdominal:      Positive bowel sounds, soft exam Extremities:     No clubbing, cyanosis or edema noted Neurologic:      Cranial nerves are grossly intact without any focal deficits appreciated Cutaneous:      No rashes or lesions noted  Assessment/Plan:   Multilobar pneumonia with respiratory failure.  Have been able to wean down to [place].  Pleuritic chest pain has resolved.  Completed course of azithromycin, now on ceftriaxone, on albuterol and Atrovent, incentive spirometry,  adequate pain control with Motrin and as needed morphine.  At this point patient is doing well, stable for floor transfer  Tora Kindred, DO  [facility] 05/24/2018  *Care during the described time interval was provided by me and/or other providers on the critical care team.  I have reviewed this patient's available data, including medical history, events of note, physical examination and test results as part of my evaluation. Patient ID: Ami Thornsberry, male   DOB: 28-Aug-1975, 43 y.o.   MRN: [id number] Patient ID: Nyan Dufresne, male   DOB: 1976/02/01, 43 y.o.   MRN: [id number] Patient ID: Demario Faniel, male   DOB: [birth year], 43 y.o.   MRN: [id number] Patient ID: Ubaldo Daywalt, male   DOB: 1976-05-08, 43 y.o.   MRN: [id number]

## 2018-05-25 ENCOUNTER — Other Ambulatory Visit: Payer: Self-pay

## 2018-05-25 LAB — CBC
HCT: 38.2 % — ABNORMAL LOW (ref 39.0–52.0)
HEMOGLOBIN: 12.3 g/dL — AB (ref 13.0–17.0)
MCH: 30.1 pg (ref 26.0–34.0)
MCHC: 32.2 g/dL (ref 30.0–36.0)
MCV: 93.6 fL (ref 80.0–100.0)
Platelets: 242 10*3/uL (ref 150–400)
RBC: 4.08 MIL/uL — ABNORMAL LOW (ref 4.22–5.81)
RDW: 12.7 % (ref 11.5–15.5)
WBC: 14 10*3/uL — AB (ref 4.0–10.5)
nRBC: 0 % (ref 0.0–0.2)

## 2018-05-25 LAB — BASIC METABOLIC PANEL
Anion gap: 10 (ref 5–15)
BUN: 11 mg/dL (ref 6–20)
CO2: 27 mmol/L (ref 22–32)
Calcium: 8.2 mg/dL — ABNORMAL LOW (ref 8.9–10.3)
Chloride: 97 mmol/L — ABNORMAL LOW (ref 98–111)
Creatinine, Ser: 0.77 mg/dL (ref 0.61–1.24)
GFR calc Af Amer: >60 mL/min (ref >60)
GFR calc non Af Amer: >60 mL/min (ref >60)
Glucose, Bld: 142 mg/dL — ABNORMAL HIGH (ref 70–99)
POTASSIUM: 3.5 mmol/L (ref 3.5–5.1)
Sodium: 134 mmol/L — ABNORMAL LOW (ref 135–145)

## 2018-05-25 LAB — PATHOLOGIST SMEAR REVIEW

## 2018-05-25 MED ORDER — OXYCODONE-ACETAMINOPHEN 5-325 MG PO TABS
1.0000 | ORAL_TABLET | Freq: Four times a day (QID) | ORAL | Status: DC | PRN
  Administered 2018-05-27 (×2): 1 via ORAL
  Filled 2018-05-25 (×2): qty 1

## 2018-05-25 NOTE — Progress Notes (Signed)
[employer] - [employer] at [facility]                                                                                                                                                                                  Patient Demographics   Brett [name], is a 43 y.o. male, DOB - 12/19/1975, PYP:[id number]  Admit date - 05/19/2018   Admitting Physician Auburn Bilberry, MD  Outpatient Primary MD for the patient is Patient, No Pcp Per   LOS - 6  Subjective: Patient states he continues to feel better every day.  He feels like his shortness of breath is much better, but still has a little ways to go.  He is hopeful he will be able to be discharged tomorrow.  Review of Systems:   CONSTITUTIONAL: No documented fever. No fatigue, weakness. No weight gain, no weight loss.  EYES: No blurry or double vision.  ENT: No tinnitus. No postnasal drip. No redness of the oropharynx.  RESPIRATORY: Positive cough, no hemoptysis.  No shortness of breath. CARDIOVASCULAR: No chest pain. No orthopnea. No palpitations. No syncope.  GASTROINTESTINAL: No nausea, no vomiting or diarrhea. No abdominal pain. No melena or hematochezia.  GENITOURINARY: No dysuria or hematuria.  ENDOCRINE: No polyuria or nocturia. No heat or cold intolerance.  HEMATOLOGY: No anemia. No bruising. No bleeding.  INTEGUMENTARY: No rashes. No lesions.  MUSCULOSKELETAL: No arthritis. No swelling. No gout.  NEUROLOGIC: No numbness, tingling, or ataxia. No seizure-type activity.  PSYCHIATRIC: No anxiety. No insomnia. No ADD.    Vitals:   Vitals:   05/25/18 0454 05/25/18 0932 05/25/18 1013 05/25/18 1308  BP: 126/71  (!) 144/87 133/80  Pulse: 93  (!) 101 94  Resp: 18  20 18   Temp: 98.1 F (36.7 C)  (!) 97.4 F (36.3 C) 98.3 F (36.8 C)  TempSrc:   Oral Oral  SpO2: 97% 95% 94% 95%  Weight:      Height:        Wt Readings from Last 3 Encounters:  05/19/18 93.9 kg  05/17/18 93.9 kg  01/21/18 95.7 kg      Intake/Output Summary (Last 24 hours) at 05/25/2018 1401 Last data filed at 05/25/2018 1353 Gross per 24 hour  Intake 320 ml  Output 2825 ml  Net -2505 ml    Physical Exam:   GENERAL: Pleasant-appearing in no apparent distress.  HEAD, EYES, EARS, NOSE AND THROAT: Atraumatic, normocephalic. Extraocular muscles are intact. Pupils equal and reactive to light. Sclerae anicteric. No conjunctival injection. No oro-pharyngeal erythema.  NECK: Supple. There is no jugular venous distention. No bruits, no lymphadenopathy, no thyromegaly.  HEART: Regular rate and rhythm,. No murmurs, no rubs,  no clicks.  LUNGS: + Diffuse rhonchi bilaterally, nasal cannula in place, able to speak in full sentences ABDOMEN: Soft, flat, nontender, nondistended. Has good bowel sounds. No hepatosplenomegaly appreciated.  EXTREMITIES: No evidence of any cyanosis, clubbing, or peripheral edema.  +2 pedal and radial pulses bilaterally.  NEUROLOGIC: The patient is alert, awake, and oriented x3 with no focal motor or sensory deficits appreciated bilaterally.  SKIN: Moist and warm with no rashes appreciated.  Psych: Not anxious, depressed   Antibiotics   Anti-infectives (From admission, onward)   Start     Dose/Rate Route Frequency Ordered Stop   05/25/18 1000  cefTRIAXone (ROCEPHIN) 2 g in sodium chloride 0.9 % 100 mL IVPB     2 g 200 mL/hr over 30 Minutes Intravenous Every 24 hours 05/24/18 1340     05/22/18 1200  azithromycin (ZITHROMAX) tablet 500 mg     500 mg Oral Daily 05/22/18 0842 05/23/18 1214   05/22/18 1200  cefTRIAXone (ROCEPHIN) 2 g in sodium chloride 0.9 % 100 mL IVPB  Status:  Discontinued     2 g 200 mL/hr over 30 Minutes Intravenous Every 24 hours 05/22/18 1028 05/24/18 1340   05/20/18 1000  azithromycin (ZITHROMAX) 500 mg in sodium chloride 0.9 % 250 mL IVPB  Status:  Discontinued     500 mg 250 mL/hr over 60 Minutes Intravenous Every 24 hours 05/19/18 1728 05/22/18 0842   05/19/18 2300   vancomycin (VANCOCIN) 1,250 mg in sodium chloride 0.9 % 250 mL IVPB  Status:  Discontinued     1,250 mg 166.7 mL/hr over 90 Minutes Intravenous Every 12 hours 05/19/18 1706 05/21/18 1020   05/19/18 1630  ceFEPIme (MAXIPIME) 1 g in sodium chloride 0.9 % 100 mL IVPB  Status:  Discontinued     1 g 200 mL/hr over 30 Minutes Intravenous Every 8 hours 05/19/18 1623 05/22/18 1028   05/19/18 1630  vancomycin (VANCOCIN) IVPB 1000 mg/200 mL premix     1,000 mg 200 mL/hr over 60 Minutes Intravenous  Once 05/19/18 1623 05/19/18 1836   05/19/18 0845  cefTRIAXone (ROCEPHIN) 2 g in sodium chloride 0.9 % 100 mL IVPB  Status:  Discontinued     2 g 200 mL/hr over 30 Minutes Intravenous Every 24 hours 05/19/18 0830 05/19/18 1608   05/19/18 0845  azithromycin (ZITHROMAX) 500 mg in sodium chloride 0.9 % 250 mL IVPB  Status:  Discontinued     500 mg 250 mL/hr over 60 Minutes Intravenous Every 24 hours 05/19/18 0830 05/19/18 1728      Medications   Scheduled Meds: . docusate sodium  100 mg Oral BID  . enoxaparin (LOVENOX) injection  40 mg Subcutaneous Q24H  . famotidine  20 mg Oral QAC breakfast  . guaiFENesin  600 mg Oral BID  . ipratropium-albuterol  3 mL Nebulization Q4H   Continuous Infusions: . cefTRIAXone (ROCEPHIN)  IV 2 g (05/25/18 1218)   PRN Meds:.acetaminophen **OR** acetaminophen, alum & mag hydroxide-simeth, guaiFENesin-codeine, ibuprofen, ipratropium-albuterol, morphine injection, ondansetron **OR** ondansetron (ZOFRAN) IV, oxyCODONE-acetaminophen   Data Review:   Micro Results Recent Results (from the past 240 hour(s))  Blood Culture (routine x 2)     Status: None   Collection Time: 05/19/18  8:36 AM  Result Value Ref Range Status   Specimen Description BLOOD RIGHT ANTECUBITAL  Final   Special Requests   Final    BOTTLES DRAWN AEROBIC AND ANAEROBIC Blood Culture adequate volume   Culture   Final    NO GROWTH 5 DAYS  Performed at [facility], [address]  [place], [place] [address]    Report Status 05/24/2018 FINAL  Final  Blood Culture (routine x 2)     Status: None   Collection Time: 05/19/18  8:39 AM  Result Value Ref Range Status   Specimen Description BLOOD LEFT AC  Final   Special Requests   Final    BOTTLES DRAWN AEROBIC AND ANAEROBIC Blood Culture results may not be optimal due to an inadequate volume of blood received in culture bottles   Culture   Final    NO GROWTH 5 DAYS Performed at [facility], [address] [place], [place] [address]    Report Status 05/24/2018 FINAL  Final  MRSA PCR Screening     Status: None   Collection Time: 05/19/18  4:18 PM  Result Value Ref Range Status   MRSA by PCR NEGATIVE NEGATIVE Final    Comment:        The GeneXpert MRSA Assay (FDA approved for NASAL specimens only), is one component of a comprehensive MRSA colonization surveillance program. It is not intended to diagnose MRSA infection nor to guide or monitor treatment for MRSA infections. Performed at [facility], [address] [place], [place] [address]   Culture, sputum-assessment     Status: None   Collection Time: 05/19/18  7:26 PM  Result Value Ref Range Status   Specimen Description SPUTUM  Final   Special Requests NONE  Final   Sputum evaluation   Final    THIS SPECIMEN IS ACCEPTABLE FOR SPUTUM CULTURE Performed at [facility], [address], [place], [place] [address]    Report Status 05/19/2018 FINAL  Final  Culture, respiratory     Status: None   Collection Time: 05/19/18  7:26 PM  Result Value Ref Range Status   Specimen Description   Final    SPUTUM Performed at [facility], [address], [place], [place] [address]    Special Requests   Final    NONE Reflexed from [id number] Performed at [facility], [address] [place], [place] [address]    Gram Stain   Final    ABUNDANT WBC PRESENT,BOTH PMN AND MONONUCLEAR RARE GRAM POSITIVE COCCI    Culture    Final    FEW Consistent with normal respiratory flora. Performed at [facility], [address], [place], [place] [address]    Report Status 05/22/2018 FINAL  Final    Radiology Reports Dg Chest 2 View  Result Date: 05/19/2018 CLINICAL DATA:  Cough and fever; shortness of breath EXAM: CHEST - 2 VIEW COMPARISON:  January 22, 2018 FINDINGS: There is patchy airspace opacity in the lung bases, slightly more on the left than on the right. Lungs elsewhere are clear. Heart is borderline enlarged with slight pulmonary venous hypertension. No adenopathy. No bone lesions. IMPRESSION: Patchy infiltrate in the bases, felt to represent pneumonia. Pulmonary vascular congestion. Electronically Signed   By: Bretta Bang III M.D.   On: 05/19/2018 08:19   Ct Chest Wo Contrast  Result Date: 05/21/2018 CLINICAL DATA:  43 year old with worsening hypoxemic respiratory failure. Community-acquired pneumonia on antibiotics. EXAM: CT CHEST WITHOUT CONTRAST TECHNIQUE: Multidetector CT imaging of the chest was performed following the standard protocol without IV contrast. COMPARISON:  Prior radiographs, most recently done today. FINDINGS: Cardiovascular: No significant vascular findings identified on noncontrast imaging. The heart is mildly enlarged. No significant pericardial effusion. Mediastinum/Nodes: There are no enlarged mediastinal, hilar  or axillary lymph nodes.There is a 9 mm AP window node on image 47/2. Hilar assessment is limited by the lack of intravenous contrast. The thyroid gland, trachea and esophagus demonstrate no significant findings. Lungs/Pleura: Trace pleural fluid bilaterally. There is extensive lower lobe consolidation with air bronchograms bilaterally. Additional patchy airspace and ground-glass opacities are present in both upper lobes and in the right middle lobe. No focal mass or endobronchial lesion identified. Upper abdomen: Borderline hepatic steatosis without focal abnormality on  noncontrast imaging. Musculoskeletal/Chest wall: There is no chest wall mass or suspicious osseous finding. IMPRESSION: 1. Multifocal ground-glass and airspace opacities in both lungs with areas of consolidation in both lower lobes and the right middle lobe, most consistent with multilobar pneumonia. These findings appear mildly progressive on recent chest radiographs. 2. No significant pleural effusion, lung mass or significant adenopathy identified. Electronically Signed   By: Carey Bullocks M.D.   On: 05/21/2018 10:44   Dg Chest Port 1 View  Result Date: 05/23/2018 CLINICAL DATA:  Pneumonia EXAM: PORTABLE CHEST 1 VIEW COMPARISON:  05/21/2018 FINDINGS: Moderate cardiomegaly. Diffuse but predominately central and basilar airspace disease. Small right pleural effusion. No pneumothorax. IMPRESSION: Bilateral airspace disease is nonspecific. Consider ARDS, pulmonary edema, or bilateral pneumonia. Small right pleural effusion. Electronically Signed   By: Jolaine Click M.D.   On: 05/23/2018 11:04   Dg Chest Port 1 View  Result Date: 05/21/2018 CLINICAL DATA:  Acute onset of respiratory failure. EXAM: PORTABLE CHEST 1 VIEW COMPARISON:  Chest radiograph performed 05/20/2018 FINDINGS: The lungs are well-aerated. Small bilateral pleural effusions are noted. Patchy bilateral airspace opacification is concerning for pneumonia, more focal than on the prior study. There is no evidence of pleural effusion or pneumothorax. The cardiomediastinal silhouette is mildly enlarged. No acute osseous abnormalities are seen. IMPRESSION: 1. Small bilateral pleural effusions noted. Patchy bilateral airspace opacification is concerning for pneumonia, more focal than on the prior study. Followup PA and lateral chest X-ray is recommended in 3-4 weeks following trial of antibiotic therapy to ensure resolution and exclude underlying malignancy. 2. Mild cardiomegaly. Electronically Signed   By: Roanna Raider M.D.   On: 05/21/2018 01:31    Dg Chest Port 1 View  Result Date: 05/20/2018 CLINICAL DATA:  Respiratory failure EXAM: PORTABLE CHEST 1 VIEW COMPARISON:  05/19/2018 FINDINGS: Moderate cardiomegaly with right-greater-than-left basilar predominant airspace opacity and right pleural effusion. Pulmonary edema suspected. IMPRESSION: Cardiomegaly, right pleural effusion and likely pulmonary edema, suggesting congestive heart failure. Electronically Signed   By: Deatra Robinson M.D.   On: 05/20/2018 04:49   Dg Chest Port 1 View  Result Date: 05/19/2018 CLINICAL DATA:  Onset of respiratory failure 1 hour prior to presentation. Patient has been transferred to the ICU. EXAM: PORTABLE CHEST 1 VIEW COMPARISON:  PA and lateral chest x-ray of May 19, 2018 FINDINGS: There is increased density at the right lung base as compared to the earlier study today. There is patchy increased density lateral to the left heart border which is new as well. The upper lobes are clear. The cardiac silhouette remains enlarged. The central pulmonary vascularity is somewhat more engorged. IMPRESSION: Increased density at the right lung base and in the left mid and lower lung may reflect atelectasis or worsening of pneumonia given the sudden change since this morning. No overt pulmonary edema although there is mild central pulmonary vascular prominence and mild cardiomegaly. Electronically Signed   By: David  [place] M.D.   On: 05/19/2018 16:32     CBC Recent  Labs  Lab 05/19/18 0819 05/20/18 0517 05/23/18 1049 05/25/18 0426  WBC 13.5* 8.8 10.4 14.0*  HGB 15.6 12.8* 13.3 12.3*  HCT 47.8 39.8 41.0 38.2*  PLT 141* 127* 228 242  MCV 93.4 93.4 91.7 93.6  MCH 30.5 30.0 29.8 30.1  MCHC 32.6 32.2 32.4 32.2  RDW 12.5 12.7 12.9 12.7  LYMPHSABS 0.6*  --  0.9  --   MONOABS 0.8  --  1.2*  --   EOSABS 0.0  --  0.0  --   BASOSABS 0.0  --  0.0  --     Chemistries  Recent Labs  Lab 05/19/18 0819 05/20/18 0517 05/21/18 0847 05/23/18 1049 05/24/18 0658  05/25/18 0426  NA 137 137 133* 134* 134* 134*  K 4.0 3.3* 3.5 3.4* 3.3* 3.5  CL 101 106 99 97* 97* 97*  CO2 26 24 24 28 29 27   GLUCOSE 211* 131* 244* 138* 124* 142*  BUN 17 14 13 14 14 11   CREATININE 1.37* 1.01 1.03 0.72 0.81 0.77  CALCIUM 9.0 7.9* 8.2* 8.2* 8.2* 8.2*  MG  --   --   --   --  2.2  --   AST 32  --   --   --   --   --   ALT 47*  --   --   --   --   --   ALKPHOS 69  --   --   --   --   --   BILITOT 1.5*  --   --   --   --   --    ------------------------------------------------------------------------------------------------------------------ estimated creatinine clearance is 144.5 mL/min (by C-G formula based on SCr of 0.77 mg/dL). ------------------------------------------------------------------------------------------------------------------ No results for input(s): HGBA1C in the last 72 hours. ------------------------------------------------------------------------------------------------------------------ No results for input(s): CHOL, HDL, LDLCALC, TRIG, CHOLHDL, LDLDIRECT in the last 72 hours. ------------------------------------------------------------------------------------------------------------------ No results for input(s): TSH, T4TOTAL, T3FREE, THYROIDAB in the last 72 hours.  Invalid input(s): FREET3 ------------------------------------------------------------------------------------------------------------------ No results for input(s): VITAMINB12, FOLATE, FERRITIN, TIBC, IRON, RETICCTPCT in the last 72 hours.  Coagulation profile No results for input(s): INR, PROTIME in the last 168 hours.  No results for input(s): DDIMER in the last 72 hours.  Cardiac Enzymes Recent Labs  Lab 05/19/18 0819  TROPONINI 0.03*   ------------------------------------------------------------------------------------------------------------------ Invalid input(s): POCBNP    Assessment & Plan   Acute respiratory failure- secondary to multilobar pneumonia.   Improving.  Currently on 3 L O2 by nasal cannula. -Continue IV ceftriaxone -Has completed treatment with azithromycin -Wean oxygen to room air -Continue Duonebs as needed -Will ambulate with pulse ox prior to discharge  Chest wall pain- right-sided, likely pleuritic chest pain.  Has greatly improved. -Motrin and Percocet as needed -Incentive spirometry  GERD- stable -Continue PPI  Patient can likely be discharged home tomorrow.     Code Status Orders  (From admission, onward)         Start     Ordered   05/19/18 1526  Full code  Continuous     05/19/18 1525        Code Status History    This patient has a current code status but no historical code status.    Advance Directive Documentation     Most Recent Value  Type of Advance Directive  Healthcare Power of Attorney  Pre-existing out of facility DNR order (yellow form or pink MOST form)  -  "MOST" Form in Place?  -     Consults CCM  DVT  Prophylaxis  Lovenox  Lab Results  Component Value Date   PLT 242 05/25/2018    Time Spent in minutes   35 minutes greater than 50% of time spent in care coordination and counseling patient regarding the condition and plan of care.   Jinny BlossomKaty D Maikayla Beggs M.D on 05/25/2018 at 2:01 PM  Between 7am to 6pm - Pager - [phone]  After 6pm go to [email or web address] - Social research officer, governmentpassword [email or web address]  [employer]   Office  [phone]

## 2018-05-25 NOTE — Progress Notes (Signed)
Pharmacy Electrolyte Monitoring Consult:  Pharmacy consulted to assist in monitoring and replacing electrolytes in this 43 y.o. male admitted on 05/19/2018 with Cough and Shortness of Breath   Labs:  Sodium (mmol/L)  Date Value  05/25/2018 134 (L)   Potassium (mmol/L)  Date Value  05/25/2018 3.5   Magnesium (mg/dL)  Date Value  53/97/6734 2.2   Calcium (mg/dL)  Date Value  19/37/9024 8.2 (L)   Albumin (g/dL)  Date Value  09/73/5329 3.9    Assessment/Plan: K 3.5  No electrolyte replacement at this time.  Will obtain BMP with am labs.   Pharmacy will continue to monitor and adjust per consult.   Ford Peddie A 05/25/2018 9:02 AM

## 2018-05-26 LAB — BASIC METABOLIC PANEL
Anion gap: 9 (ref 5–15)
BUN: 13 mg/dL (ref 6–20)
CO2: 28 mmol/L (ref 22–32)
Calcium: 8.1 mg/dL — ABNORMAL LOW (ref 8.9–10.3)
Chloride: 98 mmol/L (ref 98–111)
Creatinine, Ser: 0.82 mg/dL (ref 0.61–1.24)
GFR calc Af Amer: >60 mL/min (ref >60)
GFR calc non Af Amer: >60 mL/min (ref >60)
Glucose, Bld: 133 mg/dL — ABNORMAL HIGH (ref 70–99)
POTASSIUM: 3.4 mmol/L — AB (ref 3.5–5.1)
Sodium: 135 mmol/L (ref 135–145)

## 2018-05-26 LAB — CBC
HEMATOCRIT: 38.1 % — AB (ref 39.0–52.0)
HEMOGLOBIN: 12.2 g/dL — AB (ref 13.0–17.0)
MCH: 29.8 pg (ref 26.0–34.0)
MCHC: 32 g/dL (ref 30.0–36.0)
MCV: 92.9 fL (ref 80.0–100.0)
Platelets: 278 10*3/uL (ref 150–400)
RBC: 4.1 MIL/uL — ABNORMAL LOW (ref 4.22–5.81)
RDW: 12.7 % (ref 11.5–15.5)
WBC: 12.7 10*3/uL — AB (ref 4.0–10.5)
nRBC: 0 % (ref 0.0–0.2)

## 2018-05-26 MED ORDER — POTASSIUM CHLORIDE CRYS ER 20 MEQ PO TBCR
20.0000 meq | EXTENDED_RELEASE_TABLET | Freq: Once | ORAL | Status: AC
  Administered 2018-05-26: 10:00:00 20 meq via ORAL
  Filled 2018-05-26: qty 1

## 2018-05-26 NOTE — Progress Notes (Signed)
Pharmacy Electrolyte Monitoring Consult:  Pharmacy consulted to assist in monitoring and replacing electrolytes in this 43 y.o. male admitted on 05/19/2018 with Cough and Shortness of Breath   Labs:  Sodium (mmol/L)  Date Value  05/26/2018 135   Potassium (mmol/L)  Date Value  05/26/2018 3.4 (L)   Magnesium (mg/dL)  Date Value  34/91/7915 2.2   Calcium (mg/dL)  Date Value  05/69/7948 8.1 (L)   Albumin (g/dL)  Date Value  01/65/5374 3.9    Assessment/Plan:  K 3.4   Will give KCl po x 1   Will recheck potassium with am labs.   Pharmacy will continue to monitor and adjust per consult.   Albina Billet, PharmD, BCPS Clinical Pharmacist 05/26/2018 7:40 AM

## 2018-05-26 NOTE — Progress Notes (Signed)
[employer] - [place] at [facility]                                                                                                                                                                                  Patient Demographics   Brett [name], is a 43 y.o. male, DOB - [birth year], WUJ:[id number]  Admit date - 05/19/2018   Admitting Physician Auburn Bilberry, MD  Outpatient Primary MD for the patient is Patient, No Pcp Per   LOS - 7  Subjective: States that he feels well today.  Shortness of breath continues to improve.  He denies any fevers or chills.  He was taken off of oxygen this morning, and had desaturations to 85%.  Review of Systems:   CONSTITUTIONAL: No documented fever. No fatigue, weakness. No weight gain, no weight loss.  EYES: No blurry or double vision.  ENT: No tinnitus. No postnasal drip. No redness of the oropharynx.  RESPIRATORY: Positive cough, no hemoptysis.  No shortness of breath. CARDIOVASCULAR: No chest pain. No orthopnea. No palpitations. No syncope.  GASTROINTESTINAL: No nausea, no vomiting or diarrhea. No abdominal pain. No melena or hematochezia.  GENITOURINARY: No dysuria or hematuria.  ENDOCRINE: No polyuria or nocturia. No heat or cold intolerance.  HEMATOLOGY: No anemia. No bruising. No bleeding.  INTEGUMENTARY: No rashes. No lesions.  MUSCULOSKELETAL: No arthritis. No swelling. No gout.  NEUROLOGIC: No numbness, tingling, or ataxia. No seizure-type activity.  PSYCHIATRIC: No anxiety. No insomnia. No ADD.    Vitals:   Vitals:   05/26/18 0947 05/26/18 1210 05/26/18 1420 05/26/18 1434  BP:  136/81    Pulse:  (!) 106    Resp:  18    Temp:  98.4 F (36.9 C)    TempSrc:  Oral    SpO2: 95% 92% 90% 92%  Weight:      Height:        Wt Readings from Last 3 Encounters:  05/19/18 93.9 kg  05/17/18 93.9 kg  01/21/18 95.7 kg     Intake/Output Summary (Last 24 hours) at 05/26/2018 1653 Last data filed at 05/26/2018 1300 Gross  per 24 hour  Intake 600 ml  Output 1420 ml  Net -820 ml    Physical Exam:   GENERAL: Pleasant-appearing in no apparent distress.  HEAD, EYES, EARS, NOSE AND THROAT: Atraumatic, normocephalic. Extraocular muscles are intact. Pupils equal and reactive to light. Sclerae anicteric. No conjunctival injection. No oro-pharyngeal erythema.  NECK: Supple. There is no jugular venous distention. No bruits, no lymphadenopathy, no thyromegaly.  HEART: Regular rate and rhythm,. No murmurs, no rubs, no clicks.  LUNGS: + Diffuse rhonchi bilaterally, nasal cannula in place, able to  speak in full sentences ABDOMEN: Soft, flat, nontender, nondistended. Has good bowel sounds. No hepatosplenomegaly appreciated.  EXTREMITIES: No evidence of any cyanosis, clubbing, or peripheral edema.  +2 pedal and radial pulses bilaterally.  NEUROLOGIC: The patient is alert, awake, and oriented x3 with no focal motor or sensory deficits appreciated bilaterally.  SKIN: Moist and warm with no rashes appreciated.  Psych: Not anxious, depressed   Antibiotics   Anti-infectives (From admission, onward)   Start     Dose/Rate Route Frequency Ordered Stop   05/25/18 1000  cefTRIAXone (ROCEPHIN) 2 g in sodium chloride 0.9 % 100 mL IVPB     2 g 200 mL/hr over 30 Minutes Intravenous Every 24 hours 05/24/18 1340 05/25/18 1252   05/22/18 1200  azithromycin (ZITHROMAX) tablet 500 mg     500 mg Oral Daily 05/22/18 0842 05/23/18 1214   05/22/18 1200  cefTRIAXone (ROCEPHIN) 2 g in sodium chloride 0.9 % 100 mL IVPB  Status:  Discontinued     2 g 200 mL/hr over 30 Minutes Intravenous Every 24 hours 05/22/18 1028 05/24/18 1340   05/20/18 1000  azithromycin (ZITHROMAX) 500 mg in sodium chloride 0.9 % 250 mL IVPB  Status:  Discontinued     500 mg 250 mL/hr over 60 Minutes Intravenous Every 24 hours 05/19/18 1728 05/22/18 0842   05/19/18 2300  vancomycin (VANCOCIN) 1,250 mg in sodium chloride 0.9 % 250 mL IVPB  Status:  Discontinued     1,250  mg 166.7 mL/hr over 90 Minutes Intravenous Every 12 hours 05/19/18 1706 05/21/18 1020   05/19/18 1630  ceFEPIme (MAXIPIME) 1 g in sodium chloride 0.9 % 100 mL IVPB  Status:  Discontinued     1 g 200 mL/hr over 30 Minutes Intravenous Every 8 hours 05/19/18 1623 05/22/18 1028   05/19/18 1630  vancomycin (VANCOCIN) IVPB 1000 mg/200 mL premix     1,000 mg 200 mL/hr over 60 Minutes Intravenous  Once 05/19/18 1623 05/19/18 1836   05/19/18 0845  cefTRIAXone (ROCEPHIN) 2 g in sodium chloride 0.9 % 100 mL IVPB  Status:  Discontinued     2 g 200 mL/hr over 30 Minutes Intravenous Every 24 hours 05/19/18 0830 05/19/18 1608   05/19/18 0845  azithromycin (ZITHROMAX) 500 mg in sodium chloride 0.9 % 250 mL IVPB  Status:  Discontinued     500 mg 250 mL/hr over 60 Minutes Intravenous Every 24 hours 05/19/18 0830 05/19/18 1728      Medications   Scheduled Meds: . docusate sodium  100 mg Oral BID  . enoxaparin (LOVENOX) injection  40 mg Subcutaneous Q24H  . famotidine  20 mg Oral QAC breakfast  . guaiFENesin  600 mg Oral BID  . ipratropium-albuterol  3 mL Nebulization Q4H   Continuous Infusions:  PRN Meds:.acetaminophen **OR** acetaminophen, alum & mag hydroxide-simeth, guaiFENesin-codeine, ibuprofen, ipratropium-albuterol, morphine injection, ondansetron **OR** ondansetron (ZOFRAN) IV, oxyCODONE-acetaminophen   Data Review:   Micro Results Recent Results (from the past 240 hour(s))  Blood Culture (routine x 2)     Status: None   Collection Time: 05/19/18  8:36 AM  Result Value Ref Range Status   Specimen Description BLOOD RIGHT ANTECUBITAL  Final   Special Requests   Final    BOTTLES DRAWN AEROBIC AND ANAEROBIC Blood Culture adequate volume   Culture   Final    NO GROWTH 5 DAYS Performed at [facility], [address] [place], [place] [id number]    Report Status 05/24/2018 FINAL  Final  Blood Culture (  routine x 2)     Status: None   Collection Time: 05/19/18  8:39 AM  Result  Value Ref Range Status   Specimen Description BLOOD LEFT AC  Final   Special Requests   Final    BOTTLES DRAWN AEROBIC AND ANAEROBIC Blood Culture results may not be optimal due to an inadequate volume of blood received in culture bottles   Culture   Final    NO GROWTH 5 DAYS Performed at [facility], [address] [place], [place] [address]    Report Status 05/24/2018 FINAL  Final  MRSA PCR Screening     Status: None   Collection Time: 05/19/18  4:18 PM  Result Value Ref Range Status   MRSA by PCR NEGATIVE NEGATIVE Final    Comment:        The GeneXpert MRSA Assay (FDA approved for NASAL specimens only), is one component of a comprehensive MRSA colonization surveillance program. It is not intended to diagnose MRSA infection nor to guide or monitor treatment for MRSA infections. Performed at [facility], [address] [place], [place] [address]   Culture, sputum-assessment     Status: None   Collection Time: 05/19/18  7:26 PM  Result Value Ref Range Status   Specimen Description SPUTUM  Final   Special Requests NONE  Final   Sputum evaluation   Final    THIS SPECIMEN IS ACCEPTABLE FOR SPUTUM CULTURE Performed at [facility], [address], [place], [place] [address]    Report Status 05/19/2018 FINAL  Final  Culture, respiratory     Status: None   Collection Time: 05/19/18  7:26 PM  Result Value Ref Range Status   Specimen Description   Final    SPUTUM Performed at [facility], [address] [place], [place] [address]    Special Requests   Final    NONE Reflexed from [id number] Performed at [facility], [address] [place], [place] [address]    Gram Stain   Final    ABUNDANT WBC PRESENT,BOTH PMN AND MONONUCLEAR RARE GRAM POSITIVE COCCI    Culture   Final    FEW Consistent with normal respiratory flora. Performed at [facility], [address] [place], [place] [address]    Report Status  05/22/2018 FINAL  Final    Radiology Reports Dg Chest 2 View  Result Date: 05/19/2018 CLINICAL DATA:  Cough and fever; shortness of breath EXAM: CHEST - 2 VIEW COMPARISON:  January 22, 2018 FINDINGS: There is patchy airspace opacity in the lung bases, slightly more on the left than on the right. Lungs elsewhere are clear. Heart is borderline enlarged with slight pulmonary venous hypertension. No adenopathy. No bone lesions. IMPRESSION: Patchy infiltrate in the bases, felt to represent pneumonia. Pulmonary vascular congestion. Electronically Signed   By: Bretta Bang III M.D.   On: 05/19/2018 08:19   Ct Chest Wo Contrast  Result Date: 05/21/2018 CLINICAL DATA:  43 year old with worsening hypoxemic respiratory failure. Community-acquired pneumonia on antibiotics. EXAM: CT CHEST WITHOUT CONTRAST TECHNIQUE: Multidetector CT imaging of the chest was performed following the standard protocol without IV contrast. COMPARISON:  Prior radiographs, most recently done today. FINDINGS: Cardiovascular: No significant vascular findings identified on noncontrast imaging. The heart is mildly enlarged. No significant pericardial effusion. Mediastinum/Nodes: There are no enlarged mediastinal, hilar or axillary lymph nodes.There is a 9 mm AP window node on image 47/2. Hilar assessment is limited by the lack of intravenous contrast.  The thyroid gland, trachea and esophagus demonstrate no significant findings. Lungs/Pleura: Trace pleural fluid bilaterally. There is extensive lower lobe consolidation with air bronchograms bilaterally. Additional patchy airspace and ground-glass opacities are present in both upper lobes and in the right middle lobe. No focal mass or endobronchial lesion identified. Upper abdomen: Borderline hepatic steatosis without focal abnormality on noncontrast imaging. Musculoskeletal/Chest wall: There is no chest wall mass or suspicious osseous finding. IMPRESSION: 1. Multifocal ground-glass and  airspace opacities in both lungs with areas of consolidation in both lower lobes and the right middle lobe, most consistent with multilobar pneumonia. These findings appear mildly progressive on recent chest radiographs. 2. No significant pleural effusion, lung mass or significant adenopathy identified. Electronically Signed   By: Carey Bullocks M.D.   On: 05/21/2018 10:44   Dg Chest Port 1 View  Result Date: 05/23/2018 CLINICAL DATA:  Pneumonia EXAM: PORTABLE CHEST 1 VIEW COMPARISON:  05/21/2018 FINDINGS: Moderate cardiomegaly. Diffuse but predominately central and basilar airspace disease. Small right pleural effusion. No pneumothorax. IMPRESSION: Bilateral airspace disease is nonspecific. Consider ARDS, pulmonary edema, or bilateral pneumonia. Small right pleural effusion. Electronically Signed   By: Jolaine Click M.D.   On: 05/23/2018 11:04   Dg Chest Port 1 View  Result Date: 05/21/2018 CLINICAL DATA:  Acute onset of respiratory failure. EXAM: PORTABLE CHEST 1 VIEW COMPARISON:  Chest radiograph performed 05/20/2018 FINDINGS: The lungs are well-aerated. Small bilateral pleural effusions are noted. Patchy bilateral airspace opacification is concerning for pneumonia, more focal than on the prior study. There is no evidence of pleural effusion or pneumothorax. The cardiomediastinal silhouette is mildly enlarged. No acute osseous abnormalities are seen. IMPRESSION: 1. Small bilateral pleural effusions noted. Patchy bilateral airspace opacification is concerning for pneumonia, more focal than on the prior study. Followup PA and lateral chest X-ray is recommended in 3-4 weeks following trial of antibiotic therapy to ensure resolution and exclude underlying malignancy. 2. Mild cardiomegaly. Electronically Signed   By: Roanna Raider M.D.   On: 05/21/2018 01:31   Dg Chest Port 1 View  Result Date: 05/20/2018 CLINICAL DATA:  Respiratory failure EXAM: PORTABLE CHEST 1 VIEW COMPARISON:  05/19/2018 FINDINGS:  Moderate cardiomegaly with right-greater-than-left basilar predominant airspace opacity and right pleural effusion. Pulmonary edema suspected. IMPRESSION: Cardiomegaly, right pleural effusion and likely pulmonary edema, suggesting congestive heart failure. Electronically Signed   By: Deatra Robinson M.D.   On: 05/20/2018 04:49   Dg Chest Port 1 View  Result Date: 05/19/2018 CLINICAL DATA:  Onset of respiratory failure 1 hour prior to presentation. Patient has been transferred to the ICU. EXAM: PORTABLE CHEST 1 VIEW COMPARISON:  PA and lateral chest x-ray of May 19, 2018 FINDINGS: There is increased density at the right lung base as compared to the earlier study today. There is patchy increased density lateral to the left heart border which is new as well. The upper lobes are clear. The cardiac silhouette remains enlarged. The central pulmonary vascularity is somewhat more engorged. IMPRESSION: Increased density at the right lung base and in the left mid and lower lung may reflect atelectasis or worsening of pneumonia given the sudden change since this morning. No overt pulmonary edema although there is mild central pulmonary vascular prominence and mild cardiomegaly. Electronically Signed   By: David  [place] M.D.   On: 05/19/2018 16:32     CBC Recent Labs  Lab 05/20/18 0517 05/23/18 1049 05/25/18 0426 05/26/18 0522  WBC 8.8 10.4 14.0* 12.7*  HGB 12.8* 13.3 12.3* 12.2*  HCT 39.8 41.0 38.2* 38.1*  PLT 127* 228 242 278  MCV 93.4 91.7 93.6 92.9  MCH 30.0 29.8 30.1 29.8  MCHC 32.2 32.4 32.2 32.0  RDW 12.7 12.9 12.7 12.7  LYMPHSABS  --  0.9  --   --   MONOABS  --  1.2*  --   --   EOSABS  --  0.0  --   --   BASOSABS  --  0.0  --   --     Chemistries  Recent Labs  Lab 05/21/18 0847 05/23/18 1049 05/24/18 0658 05/25/18 0426 05/26/18 0522  NA 133* 134* 134* 134* 135  K 3.5 3.4* 3.3* 3.5 3.4*  CL 99 97* 97* 97* 98  CO2 24 28 29 27 28   GLUCOSE 244* 138* 124* 142* 133*  BUN 13 14 14  11 13   CREATININE 1.03 0.72 0.81 0.77 0.82  CALCIUM 8.2* 8.2* 8.2* 8.2* 8.1*  MG  --   --  2.2  --   --    ------------------------------------------------------------------------------------------------------------------ estimated creatinine clearance is 141 mL/min (by C-G formula based on SCr of 0.82 mg/dL). ------------------------------------------------------------------------------------------------------------------ No results for input(s): HGBA1C in the last 72 hours. ------------------------------------------------------------------------------------------------------------------ No results for input(s): CHOL, HDL, LDLCALC, TRIG, CHOLHDL, LDLDIRECT in the last 72 hours. ------------------------------------------------------------------------------------------------------------------ No results for input(s): TSH, T4TOTAL, T3FREE, THYROIDAB in the last 72 hours.  Invalid input(s): FREET3 ------------------------------------------------------------------------------------------------------------------ No results for input(s): VITAMINB12, FOLATE, FERRITIN, TIBC, IRON, RETICCTPCT in the last 72 hours.  Coagulation profile No results for input(s): INR, PROTIME in the last 168 hours.  No results for input(s): DDIMER in the last 72 hours.  Cardiac Enzymes No results for input(s): CKMB, TROPONINI, MYOGLOBIN in the last 168 hours.  Invalid input(s): CK ------------------------------------------------------------------------------------------------------------------ Invalid input(s): POCBNP    Assessment & Plan   Acute respiratory failure- secondary to multilobar pneumonia.  Improving.  Currently on 2 L O2 by nasal cannula.  Desaturations to 85% on room air this morning. -Completed antibiotic course -Wean oxygen to room air -Continue duonebs as needed -Will ambulate with pulse ox prior to discharge  Chest wall pain- right-sided, likely pleuritic chest pain.  Has greatly  improved. -Motrin and Percocet as needed -Incentive spirometry  GERD- stable -Continue PPI  Patient can likely be discharged home tomorrow.     Code Status Orders  (From admission, onward)         Start     Ordered   05/19/18 1526  Full code  Continuous     05/19/18 1525        Code Status History    This patient has a current code status but no historical code status.    Advance Directive Documentation     Most Recent Value  Type of Advance Directive  Healthcare Power of Attorney  Pre-existing out of facility DNR order (yellow form or pink MOST form)  -  "MOST" Form in Place?  -     Consults CCM  DVT Prophylaxis  Lovenox  Lab Results  Component Value Date   PLT 278 05/26/2018    Time Spent in minutes   35 minutes greater than 50% of time spent in care coordination and counseling patient regarding the condition and plan of care.   Jinny Blossom Tommie Dejoseph M.D on 05/26/2018 at 4:53 PM  Between 7am to 6pm - Pager - [phone]  After 6pm go to [email or web address] - Social research officer, government  [employer]   Office  [phone]

## 2018-05-26 NOTE — Care Management (Signed)
Patient admitted for increasing shortness of breath and fever.  Found to have pneumonia.  He was transferred to icu due to need for closer monitoring of his respiratory failure and warmed HFNC.  Transferred to stepdown and moved out to 1C on 1/5.  Patient is still requiring supplemental oxygen.  CM unable to find documentation of underlying chronic cardiopulmonary condition. Patient will require home 02 assessment prior to discharge.

## 2018-05-27 LAB — POTASSIUM: Potassium: 3.7 mmol/L (ref 3.5–5.1)

## 2018-05-27 MED ORDER — BUDESONIDE 0.25 MG/2ML IN SUSP
0.2500 mg | Freq: Two times a day (BID) | RESPIRATORY_TRACT | Status: DC
  Administered 2018-05-27 – 2018-05-28 (×2): 0.25 mg via RESPIRATORY_TRACT
  Filled 2018-05-27 (×2): qty 2

## 2018-05-27 MED ORDER — IPRATROPIUM-ALBUTEROL 0.5-2.5 (3) MG/3ML IN SOLN
3.0000 mL | Freq: Three times a day (TID) | RESPIRATORY_TRACT | Status: DC
  Administered 2018-05-27 – 2018-05-28 (×3): 3 mL via RESPIRATORY_TRACT
  Filled 2018-05-27 (×3): qty 3

## 2018-05-27 NOTE — Progress Notes (Signed)
[employer] [facility] [place] at [facility]                                                                                                                                                                                  Patient Demographics   Brett [name], is a 43 y.o. male, DOB - 08/12/1975, FHQ:[id number]  Admit date - 05/19/2018   Admitting Physician Auburn Bilberry, MD  Outpatient Primary MD for the patient is Patient, No Pcp Per   LOS - 8  Subjective: O2 sat dropped to 84% on ambulation when he is bathroom and came back.  Now on 2 L.  Does have cough, phlegm and able to spit out the phlegm. Review of Systems:   CONSTITUTIONAL: No documented fever. No fatigue, weakness. No weight gain, no weight loss.  EYES: No blurry or double vision.  ENT: No tinnitus. No postnasal drip. No redness of the oropharynx.  RESPIRATORY: Positive cough, no hemoptysis.  No shortness of breath. CARDIOVASCULAR: No chest pain. No orthopnea. No palpitations. No syncope.  GASTROINTESTINAL: No nausea, no vomiting or diarrhea. No abdominal pain. No melena or hematochezia.  GENITOURINARY: No dysuria or hematuria.  ENDOCRINE: No polyuria or nocturia. No heat or cold intolerance.  HEMATOLOGY: No anemia. No bruising. No bleeding.  INTEGUMENTARY: No rashes. No lesions.  MUSCULOSKELETAL: No arthritis. No swelling. No gout.  NEUROLOGIC: No numbness, tingling, or ataxia. No seizure-type activity.  PSYCHIATRIC: No anxiety. No insomnia. No ADD.    Vitals:   Vitals:   05/27/18 0414 05/27/18 0439 05/27/18 1200 05/27/18 1213  BP:  127/84    Pulse:  100    Resp:  18    Temp:  98.6 F (37 C)    TempSrc:  Oral    SpO2: 94% 94% (!) 85% 92%  Weight:      Height:        Wt Readings from Last 3 Encounters:  05/19/18 93.9 kg  05/17/18 93.9 kg  01/21/18 95.7 kg     Intake/Output Summary (Last 24 hours) at 05/27/2018 1231 Last data filed at 05/27/2018 1016 Gross per 24 hour  Intake 720 ml  Output -  Net  720 ml    Physical Exam:   GENERAL: Pleasant-appearing in no apparent distress.  HEAD, EYES, EARS, NOSE AND THROAT: Atraumatic, normocephalic. Extraocular muscles are intact. Pupils equal and reactive to light. Sclerae anicteric. No conjunctival injection. No oro-pharyngeal erythema.  NECK: Supple. There is no jugular venous distention. No bruits, no lymphadenopathy, no thyromegaly.  HEART: Regular rate and rhythm,. No murmurs, no rubs, no clicks.  LUNGS: + Diffuse rhonchi bilaterally, nasal cannula in place, able to speak in full sentences ABDOMEN:  Soft, flat, nontender, nondistended. Has good bowel sounds. No hepatosplenomegaly appreciated.  EXTREMITIES: No evidence of any cyanosis, clubbing, or peripheral edema.  +2 pedal and radial pulses bilaterally.  NEUROLOGIC: The patient is alert, awake, and oriented x3 with no focal motor or sensory deficits appreciated bilaterally.  SKIN: Moist and warm with no rashes appreciated.  Psych: Not anxious, depressed   Antibiotics   Anti-infectives (From admission, onward)   Start     Dose/Rate Route Frequency Ordered Stop   05/25/18 1000  cefTRIAXone (ROCEPHIN) 2 g in sodium chloride 0.9 % 100 mL IVPB     2 g 200 mL/hr over 30 Minutes Intravenous Every 24 hours 05/24/18 1340 05/25/18 1252   05/22/18 1200  azithromycin (ZITHROMAX) tablet 500 mg     500 mg Oral Daily 05/22/18 0842 05/23/18 1214   05/22/18 1200  cefTRIAXone (ROCEPHIN) 2 g in sodium chloride 0.9 % 100 mL IVPB  Status:  Discontinued     2 g 200 mL/hr over 30 Minutes Intravenous Every 24 hours 05/22/18 1028 05/24/18 1340   05/20/18 1000  azithromycin (ZITHROMAX) 500 mg in sodium chloride 0.9 % 250 mL IVPB  Status:  Discontinued     500 mg 250 mL/hr over 60 Minutes Intravenous Every 24 hours 05/19/18 1728 05/22/18 0842   05/19/18 2300  vancomycin (VANCOCIN) 1,250 mg in sodium chloride 0.9 % 250 mL IVPB  Status:  Discontinued     1,250 mg 166.7 mL/hr over 90 Minutes Intravenous Every  12 hours 05/19/18 1706 05/21/18 1020   05/19/18 1630  ceFEPIme (MAXIPIME) 1 g in sodium chloride 0.9 % 100 mL IVPB  Status:  Discontinued     1 g 200 mL/hr over 30 Minutes Intravenous Every 8 hours 05/19/18 1623 05/22/18 1028   05/19/18 1630  vancomycin (VANCOCIN) IVPB 1000 mg/200 mL premix     1,000 mg 200 mL/hr over 60 Minutes Intravenous  Once 05/19/18 1623 05/19/18 1836   05/19/18 0845  cefTRIAXone (ROCEPHIN) 2 g in sodium chloride 0.9 % 100 mL IVPB  Status:  Discontinued     2 g 200 mL/hr over 30 Minutes Intravenous Every 24 hours 05/19/18 0830 05/19/18 1608   05/19/18 0845  azithromycin (ZITHROMAX) 500 mg in sodium chloride 0.9 % 250 mL IVPB  Status:  Discontinued     500 mg 250 mL/hr over 60 Minutes Intravenous Every 24 hours 05/19/18 0830 05/19/18 1728      Medications   Scheduled Meds: . docusate sodium  100 mg Oral BID  . enoxaparin (LOVENOX) injection  40 mg Subcutaneous Q24H  . famotidine  20 mg Oral QAC breakfast  . guaiFENesin  600 mg Oral BID  . ipratropium-albuterol  3 mL Nebulization TID   Continuous Infusions:  PRN Meds:.acetaminophen **OR** acetaminophen, alum & mag hydroxide-simeth, guaiFENesin-codeine, ibuprofen, ipratropium-albuterol, morphine injection, ondansetron **OR** ondansetron (ZOFRAN) IV, oxyCODONE-acetaminophen   Data Review:   Micro Results Recent Results (from the past 240 hour(s))  Blood Culture (routine x 2)     Status: None   Collection Time: 05/19/18  8:36 AM  Result Value Ref Range Status   Specimen Description BLOOD RIGHT ANTECUBITAL  Final   Special Requests   Final    BOTTLES DRAWN AEROBIC AND ANAEROBIC Blood Culture adequate volume   Culture   Final    NO GROWTH 5 DAYS Performed at [facility], [address] [place], [place] [address]    Report Status 05/24/2018 FINAL  Final  Blood Culture (routine x 2)  Status: None   Collection Time: 05/19/18  8:39 AM  Result Value Ref Range Status   Specimen Description  BLOOD LEFT AC  Final   Special Requests   Final    BOTTLES DRAWN AEROBIC AND ANAEROBIC Blood Culture results may not be optimal due to an inadequate volume of blood received in culture bottles   Culture   Final    NO GROWTH 5 DAYS Performed at [facility], [address] [place], [place] [address]    Report Status 05/24/2018 FINAL  Final  MRSA PCR Screening     Status: None   Collection Time: 05/19/18  4:18 PM  Result Value Ref Range Status   MRSA by PCR NEGATIVE NEGATIVE Final    Comment:        The GeneXpert MRSA Assay (FDA approved for NASAL specimens only), is one component of a comprehensive MRSA colonization surveillance program. It is not intended to diagnose MRSA infection nor to guide or monitor treatment for MRSA infections. Performed at [facility], [address] [place], [place] [address]   Culture, sputum-assessment     Status: None   Collection Time: 05/19/18  7:26 PM  Result Value Ref Range Status   Specimen Description SPUTUM  Final   Special Requests NONE  Final   Sputum evaluation   Final    THIS SPECIMEN IS ACCEPTABLE FOR SPUTUM CULTURE Performed at [facility], [address], [place], [place] [address]    Report Status 05/19/2018 FINAL  Final  Culture, respiratory     Status: None   Collection Time: 05/19/18  7:26 PM  Result Value Ref Range Status   Specimen Description   Final    SPUTUM Performed at [facility], [address] [place], [place] [address]    Special Requests   Final    NONE Reflexed from [id number] Performed at [facility], [address] [place], [place] [address]    Gram Stain   Final    ABUNDANT WBC PRESENT,BOTH PMN AND MONONUCLEAR RARE GRAM POSITIVE COCCI    Culture   Final    FEW Consistent with normal respiratory flora. Performed at [facility], [address] [place], [place] [address]    Report Status 05/22/2018 FINAL  Final    Radiology Reports Dg  Chest 2 View  Result Date: 05/19/2018 CLINICAL DATA:  Cough and fever; shortness of breath EXAM: CHEST - 2 VIEW COMPARISON:  January 22, 2018 FINDINGS: There is patchy airspace opacity in the lung bases, slightly more on the left than on the right. Lungs elsewhere are clear. Heart is borderline enlarged with slight pulmonary venous hypertension. No adenopathy. No bone lesions. IMPRESSION: Patchy infiltrate in the bases, felt to represent pneumonia. Pulmonary vascular congestion. Electronically Signed   By: Bretta Bang III M.D.   On: 05/19/2018 08:19   Ct Chest Wo Contrast  Result Date: 05/21/2018 CLINICAL DATA:  44 year old with worsening hypoxemic respiratory failure. Community-acquired pneumonia on antibiotics. EXAM: CT CHEST WITHOUT CONTRAST TECHNIQUE: Multidetector CT imaging of the chest was performed following the standard protocol without IV contrast. COMPARISON:  Prior radiographs, most recently done today. FINDINGS: Cardiovascular: No significant vascular findings identified on noncontrast imaging. The heart is mildly enlarged. No significant pericardial effusion. Mediastinum/Nodes: There are no enlarged mediastinal, hilar or axillary lymph nodes.There is a 9 mm AP window node on image 47/2. Hilar assessment is limited by the lack of intravenous contrast. The thyroid gland, trachea and esophagus demonstrate  no significant findings. Lungs/Pleura: Trace pleural fluid bilaterally. There is extensive lower lobe consolidation with air bronchograms bilaterally. Additional patchy airspace and ground-glass opacities are present in both upper lobes and in the right middle lobe. No focal mass or endobronchial lesion identified. Upper abdomen: Borderline hepatic steatosis without focal abnormality on noncontrast imaging. Musculoskeletal/Chest wall: There is no chest wall mass or suspicious osseous finding. IMPRESSION: 1. Multifocal ground-glass and airspace opacities in both lungs with areas of  consolidation in both lower lobes and the right middle lobe, most consistent with multilobar pneumonia. These findings appear mildly progressive on recent chest radiographs. 2. No significant pleural effusion, lung mass or significant adenopathy identified. Electronically Signed   By: Carey Bullocks M.D.   On: 05/21/2018 10:44   Dg Chest Port 1 View  Result Date: 05/23/2018 CLINICAL DATA:  Pneumonia EXAM: PORTABLE CHEST 1 VIEW COMPARISON:  05/21/2018 FINDINGS: Moderate cardiomegaly. Diffuse but predominately central and basilar airspace disease. Small right pleural effusion. No pneumothorax. IMPRESSION: Bilateral airspace disease is nonspecific. Consider ARDS, pulmonary edema, or bilateral pneumonia. Small right pleural effusion. Electronically Signed   By: Jolaine Click M.D.   On: 05/23/2018 11:04   Dg Chest Port 1 View  Result Date: 05/21/2018 CLINICAL DATA:  Acute onset of respiratory failure. EXAM: PORTABLE CHEST 1 VIEW COMPARISON:  Chest radiograph performed 05/20/2018 FINDINGS: The lungs are well-aerated. Small bilateral pleural effusions are noted. Patchy bilateral airspace opacification is concerning for pneumonia, more focal than on the prior study. There is no evidence of pleural effusion or pneumothorax. The cardiomediastinal silhouette is mildly enlarged. No acute osseous abnormalities are seen. IMPRESSION: 1. Small bilateral pleural effusions noted. Patchy bilateral airspace opacification is concerning for pneumonia, more focal than on the prior study. Followup PA and lateral chest X-ray is recommended in 3-4 weeks following trial of antibiotic therapy to ensure resolution and exclude underlying malignancy. 2. Mild cardiomegaly. Electronically Signed   By: Roanna Raider M.D.   On: 05/21/2018 01:31   Dg Chest Port 1 View  Result Date: 05/20/2018 CLINICAL DATA:  Respiratory failure EXAM: PORTABLE CHEST 1 VIEW COMPARISON:  05/19/2018 FINDINGS: Moderate cardiomegaly with right-greater-than-left  basilar predominant airspace opacity and right pleural effusion. Pulmonary edema suspected. IMPRESSION: Cardiomegaly, right pleural effusion and likely pulmonary edema, suggesting congestive heart failure. Electronically Signed   By: Deatra Robinson M.D.   On: 05/20/2018 04:49   Dg Chest Port 1 View  Result Date: 05/19/2018 CLINICAL DATA:  Onset of respiratory failure 1 hour prior to presentation. Patient has been transferred to the ICU. EXAM: PORTABLE CHEST 1 VIEW COMPARISON:  PA and lateral chest x-ray of May 19, 2018 FINDINGS: There is increased density at the right lung base as compared to the earlier study today. There is patchy increased density lateral to the left heart border which is new as well. The upper lobes are clear. The cardiac silhouette remains enlarged. The central pulmonary vascularity is somewhat more engorged. IMPRESSION: Increased density at the right lung base and in the left mid and lower lung may reflect atelectasis or worsening of pneumonia given the sudden change since this morning. No overt pulmonary edema although there is mild central pulmonary vascular prominence and mild cardiomegaly. Electronically Signed   By: David  [place] M.D.   On: 05/19/2018 16:32     CBC Recent Labs  Lab 05/23/18 1049 05/25/18 0426 05/26/18 0522  WBC 10.4 14.0* 12.7*  HGB 13.3 12.3* 12.2*  HCT 41.0 38.2* 38.1*  PLT 228 242 278  MCV  91.7 93.6 92.9  MCH 29.8 30.1 29.8  MCHC 32.4 32.2 32.0  RDW 12.9 12.7 12.7  LYMPHSABS 0.9  --   --   MONOABS 1.2*  --   --   EOSABS 0.0  --   --   BASOSABS 0.0  --   --     Chemistries  Recent Labs  Lab 05/21/18 0847 05/23/18 1049 05/24/18 0658 05/25/18 0426 05/26/18 0522 05/27/18 0535  NA 133* 134* 134* 134* 135  --   K 3.5 3.4* 3.3* 3.5 3.4* 3.7  CL 99 97* 97* 97* 98  --   CO2 24 28 29 27 28   --   GLUCOSE 244* 138* 124* 142* 133*  --   BUN 13 14 14 11 13   --   CREATININE 1.03 0.72 0.81 0.77 0.82  --   CALCIUM 8.2* 8.2* 8.2* 8.2* 8.1*   --   MG  --   --  2.2  --   --   --    ------------------------------------------------------------------------------------------------------------------ estimated creatinine clearance is 141 mL/min (by C-G formula based on SCr of 0.82 mg/dL). ------------------------------------------------------------------------------------------------------------------ No results for input(s): HGBA1C in the last 72 hours. ------------------------------------------------------------------------------------------------------------------ No results for input(s): CHOL, HDL, LDLCALC, TRIG, CHOLHDL, LDLDIRECT in the last 72 hours. ------------------------------------------------------------------------------------------------------------------ No results for input(s): TSH, T4TOTAL, T3FREE, THYROIDAB in the last 72 hours.  Invalid input(s): FREET3 ------------------------------------------------------------------------------------------------------------------ No results for input(s): VITAMINB12, FOLATE, FERRITIN, TIBC, IRON, RETICCTPCT in the last 72 hours.  Coagulation profile No results for input(s): INR, PROTIME in the last 168 hours.  No results for input(s): DDIMER in the last 72 hours.  Cardiac Enzymes No results for input(s): CKMB, TROPONINI, MYOGLOBIN in the last 168 hours.  Invalid input(s): CK ------------------------------------------------------------------------------------------------------------------ Invalid input(s): POCBNP    Assessment & Plan   Acute respiratory failure- secondary to multilobar pneumonia.  Improving.  Currently on 2 L O2 by nasal cannula.  Desaturations to 85% on room air this morning. -Completed antibiotic course Continue duo nebs, Pulmicort, still requiring oxygen, on 2 L of oxygen without without oxygen sats are like 87%; continue to encourage sputum production, patient does not want to take Mucinex as he able to cough out phlegm.    Chest wall pain-  right-sided, likely pleuritic chest pain.  Has greatly improved. -Motrin and Percocet as needed -Incentive spirometry  GERD- stable -Continue PPI  Patient can likely be discharged home tomorrow.     Code Status Orders  (From admission, onward)         Start     Ordered   05/19/18 1526  Full code  Continuous     05/19/18 1525        Code Status History    This patient has a current code status but no historical code status.    Advance Directive Documentation     Most Recent Value  Type of Advance Directive  Healthcare Power of Attorney  Pre-existing out of facility DNR order (yellow form or pink MOST form)  -  "MOST" Form in Place?  -     Consults CCM  DVT Prophylaxis  Lovenox  Lab Results  Component Value Date   PLT 278 05/26/2018    Time Spent in minutes   35 minutes greater than 50% of time spent in care coordination and counseling patient regarding the condition and plan of care.   Katha HammingSnehalatha Nazaiah Navarrete M.D on 05/27/2018 at 12:31 PM  Between 7am to 6pm - Pager - [phone]  After 6pm go to [email or web address] -  password Airline pilot  [employer]  [phone]

## 2018-05-27 NOTE — Progress Notes (Signed)
Pharmacy Electrolyte Monitoring Consult:  Pharmacy consulted to assist in monitoring and replacing electrolytes in this 43 y.o. male admitted on 05/19/2018 with Cough and Shortness of Breath   Labs:  Sodium (mmol/L)  Date Value  05/26/2018 135   Potassium (mmol/L)  Date Value  05/27/2018 3.7   Magnesium (mg/dL)  Date Value  52/84/1324 2.2   Calcium (mg/dL)  Date Value  40/02/2724 8.1 (L)   Albumin (g/dL)  Date Value  36/64/4034 3.9    Assessment/Plan:  K 3.7   No replenishment warranted at this time  Will recheck potassium with am labs.   Pharmacy will continue to monitor and adjust per consult (will consider signing off tomorrow if K holds without supplement).   Albina Billet, PharmD, BCPS Clinical Pharmacist 05/27/2018 7:26 AM

## 2018-05-28 ENCOUNTER — Inpatient Hospital Stay: Payer: BLUE CROSS/BLUE SHIELD

## 2018-05-28 LAB — POTASSIUM: Potassium: 4 mmol/L (ref 3.5–5.1)

## 2018-05-28 MED ORDER — IPRATROPIUM-ALBUTEROL 0.5-2.5 (3) MG/3ML IN SOLN: 3.0000 mL | Freq: Four times a day (QID) | RESPIRATORY_TRACT | 0 refills | Status: DC

## 2018-05-28 MED ORDER — ALBUTEROL SULFATE HFA 108 (90 BASE) MCG/ACT IN AERS: 2.0000 | INHALATION_SPRAY | RESPIRATORY_TRACT | 2 refills | Status: DC | PRN

## 2018-05-28 MED ORDER — PREDNISONE 10 MG (21) PO TBPK: ORAL_TABLET | ORAL | 0 refills | Status: DC

## 2018-05-28 MED ORDER — IPRATROPIUM-ALBUTEROL 0.5-2.5 (3) MG/3ML IN SOLN
3.0000 mL | Freq: Four times a day (QID) | RESPIRATORY_TRACT | Status: DC
  Administered 2018-05-28: 3 mL via RESPIRATORY_TRACT
  Filled 2018-05-28: qty 3

## 2018-05-28 NOTE — Progress Notes (Signed)
Pharmacy Electrolyte Monitoring Consult:  Pharmacy consulted to assist in monitoring and replacing electrolytes in this 43 y.o. male admitted on 05/19/2018 with Cough and Shortness of Breath   Labs:  Sodium (mmol/L)  Date Value  05/26/2018 135   Potassium (mmol/L)  Date Value  05/28/2018 4.0   Magnesium (mg/dL)  Date Value  16/02/9603 2.2   Calcium (mg/dL)  Date Value  54/01/8118 8.1 (L)   Albumin (g/dL)  Date Value  14/78/2956 3.9    Assessment/Plan:  K 4.0   No replenishment warranted at this time  Pharmacy will sign off  Albina Billet, PharmD, BCPS Clinical Pharmacist 05/28/2018 7:13 AM

## 2018-05-28 NOTE — Care Management (Signed)
Discharge to home today per Dr. Luberta Mutter. Nebulizer for home use ordered. Jemanine, [facility] representative updated. Friend/family will transport Gwenette Greet RN MSN CCM Care Management [phone]

## 2018-05-28 NOTE — Progress Notes (Addendum)
Repeat chest x-ray done showed moderate right pleural effusion, discussed the results with patient, hypoxia improved, saturation more than 90%, patient is eager to go home, patient needs repeat chest x-ray with Dr. Kinnie Feil in couple of days, he understands the need for follow-up. discharge home today with bronchodilators, tapering steroids.

## 2018-05-28 NOTE — Progress Notes (Signed)
Reviewed AVS with patient and family. Pt verbalized understanding.

## 2018-05-29 ENCOUNTER — Encounter: Payer: Self-pay | Admitting: Internal Medicine

## 2018-05-29 ENCOUNTER — Telehealth: Payer: Self-pay | Admitting: Internal Medicine

## 2018-05-29 ENCOUNTER — Ambulatory Visit (INDEPENDENT_AMBULATORY_CARE_PROVIDER_SITE_OTHER): Payer: BLUE CROSS/BLUE SHIELD | Admitting: Internal Medicine

## 2018-05-29 VITALS — BP 122/70 | HR 113 | Ht 72.0 in | Wt 197.4 lb

## 2018-05-29 DIAGNOSIS — J181 Lobar pneumonia, unspecified organism: Secondary | ICD-10-CM

## 2018-05-29 MED ORDER — PREDNISONE 20 MG PO TABS: 40.0000 mg | ORAL_TABLET | Freq: Every day | ORAL | 0 refills | Status: DC

## 2018-05-29 NOTE — Patient Instructions (Signed)
Start Prednisone 40 mg daily for 10 days  Check ambulating pulse oximetry to assess for hypoxia  Continue DOUNEBS every 4 hrs

## 2018-05-29 NOTE — Addendum Note (Signed)
Addended by: Maxwell Marion A on: 05/29/2018 12:12 PM   Modules accepted: Orders

## 2018-05-29 NOTE — Progress Notes (Signed)
Name: Brett [name] MRN: [id number] DOB: 03/24/1976     CONSULTATION DATE: 05/29/18 REFERRING MD : mody  CHIEF COMPLAINT: SOB  STUDIES:     CXR independently reviewed by Me    05/21/18 CT chest Independently reviewed by Me   B/l opacities-multifocal interpretation multifocal pneumonia   HISTORY OF PRESENT ILLNESS: 43 yo AAM seen today for abnormal CT chest  [facility] for increased WOB and SOB with b/l infiltrates  Prior to admission-patient has fevers, chills, productive cough and hypoxia  Patient dx with multifocal pneumonia  Patient was treated with IV abx for 9 days, and oxygen therapy Was  discharged yesterday  He still feels weak, has productive cough Has pleuritic chest pain from cough and pneumonia  +sick contact-patients son  Patient was sent home with flutter valve and incentive spirometry Started on prednisone today  HIV NEG FLU A/B NEG  PAST MEDICAL HISTORY :   has a past medical history of Acid reflux and Speech impediment.  has a past surgical history that includes Tonsillectomy. Prior to Admission medications   Medication Sig Start Date End Date Taking? Authorizing Provider  albuterol (PROVENTIL HFA;VENTOLIN HFA) 108 (90 Base) MCG/ACT inhaler Inhale 2 puffs into the lungs every 4 (four) hours as needed for wheezing or shortness of breath. 05/28/18  Yes Katha HammingKonidena, Snehalatha, MD  guaiFENesin-codeine 100-10 MG/5ML syrup Take 5 mLs by mouth every 6 (six) hours as needed for cough. 05/17/18  Yes Tommi RumpsSummers, Rhonda L, PA-C  ibuprofen (ADVIL,MOTRIN) 800 MG tablet Take 800 mg by mouth every 6 (six) hours as needed.   Yes [provider]  ipratropium-albuterol (DUONEB) 0.5-2.5 (3) MG/3ML SOLN Take 3 mLs by nebulization every 6 (six) hours. 05/28/18  Yes Katha HammingKonidena, Snehalatha, MD  predniSONE (STERAPRED UNI-PAK 21 TAB) 10 MG (21) TBPK tablet Taper  By 10 mg daily 05/28/18  Yes Katha HammingKonidena, Snehalatha, MD   Allergies  Allergen Reactions    . Pantoprazole Hives  . Penicillins Hives    FAMILY HISTORY:  family history includes Diabetes in his maternal grandmother; Healthy in his mother; Pneumonia in his father. SOCIAL HISTORY:  reports that he has never smoked. He has never used smokeless tobacco. He reports current alcohol use. He reports previous drug use. Drug: Marijuana.  REVIEW OF SYSTEMS:   Constitutional: Negative for fever, chills, weight loss, +malaise/fatigue and -diaphoresis.  HENT: Negative for hearing loss, ear pain, nosebleeds, congestion, sore throat, neck pain, tinnitus and ear discharge.   Eyes: Negative for blurred vision, double vision, photophobia, pain, discharge and redness.  Respiratory: + cough, -hemoptysis, +sputum production, +shortness of breath, +wheezing and -stridor.   Cardiovascular: Negative for chest pain, palpitations, orthopnea, claudication, leg swelling and PND.  Gastrointestinal: Negative for heartburn, nausea, vomiting, abdominal pain, diarrhea, constipation, blood in stool and melena.  Genitourinary: Negative for dysuria, urgency, frequency, hematuria and flank pain.  Musculoskeletal: Negative for myalgias, back pain, joint pain and falls.  Skin: Negative for itching and rash.  Neurological: Negative for dizziness, tingling, tremors, sensory change, speech change, focal weakness, seizures, loss of consciousness, weakness and headaches.  Endo/Heme/Allergies: Negative for environmental allergies and polydipsia. Does not bruise/bleed easily.  ALL OTHER ROS ARE NEGATIVE  BP 122/70 (BP Location: Left Arm, Cuff Size: Normal)   Pulse (!) 113   Ht 6' (1.829 m)   Wt 197 lb 6.4 oz (89.5 kg)   SpO2 91%   BMI 26.77 kg/m   Physical Examination:   GENERAL:NAD, no fevers, chills, no weakness no  fatigue HEAD: Normocephalic, atraumatic.  EYES: Pupils equal, round, reactive to light. Extraocular muscles intact. No scleral icterus.  MOUTH: Moist mucosal membrane.   EAR, NOSE, THROAT: Clear  without exudates. No external lesions.  NECK: Supple. No thyromegaly. No nodules. No JVD.  PULMONARY:CTA B/L no wheezes, no crackles, + rhonchi CARDIOVASCULAR: S1 and S2. Regular rate and rhythm. No murmurs, rubs, or gallops. No edema.  GASTROINTESTINAL: Soft, nontender, nondistended. No masses. Positive bowel sounds.  MUSCULOSKELETAL: No swelling, clubbing, or edema. Range of motion full in all extremities.  NEUROLOGIC: Cranial nerves II through XII are intact. No gross focal neurological deficits.  SKIN: No ulceration, lesions, rashes, or cyanosis. Skin warm and dry. Turgor intact.  PSYCHIATRIC: Mood, affect within normal limits. The patient is awake, alert and oriented x 3. Insight, judgment intact.      ASSESSMENT / PLAN:  43 yo pleasant AAM with extensive multifocal pneumonia with extensive b/l infiltrates Patient has completed a course of ABX, patient now with residual cough and intermittent wheezing with SOB and DOE  AT this time, I would recommend starting prednisone 40 mg daily for 10 days  Recommend DOUNEBS every 4 hrs  Recommend Flutter Valve 10-15 times per day  Recommend Incentive Spirometry 10-15 times per day  exertional hypoxia noted on ambulating pulse oximetry-o2 sats dropped to 86% patient will need oxygen with exertion-will repeat ambulating pulse ox in 2-4 weeks     Patient/family satisfied with Plan of action and management. All questions answered Follow up in 2 weeks   Rumor [name] Brett [name], M.D.  [facility]  Medical Director [facility] Medical Director [facility] Cardio-Pulmonary Department

## 2018-05-29 NOTE — Telephone Encounter (Signed)
Per Dietrich Pates will be delivering oxygen today.  Pt is aware and voiced his understanding. Nothing further is needed.

## 2018-06-03 NOTE — Discharge Summary (Signed)
Brett MuffDevon [name], is a 43 y.o. male  DOB 01/30/1976  MRN [id number].  Admission date:  05/19/2018  Admitting Physician  Auburn BilberryShreyang Patel, MD  Discharge Date:  06/03/2018   Primary MD  Sherron Mondayejan-Sie, S Ahmed, MD  Recommendations for primary care physician for things to follow:  Follow-up with PCP cannot take, patient PCP is Dr. Luci Bankeajansie   Admission Diagnosis  Respiratory failure (HCC) [J96.90] Sepsis with acute hypoxic respiratory failure without septic shock, due to unspecified organism (HCC) [A41.9, R65.20, J96.01]   Discharge Diagnosis  Respiratory failure (HCC) [J96.90] Sepsis with acute hypoxic respiratory failure without septic shock, due to unspecified organism (HCC) [A41.9, R65.20, J96.01]   Active Problems:   PNA (pneumonia)      Past Medical History:  Diagnosis Date  . Acid reflux   . Speech impediment     Past Surgical History:  Procedure Laterality Date  . TONSILLECTOMY         History of present illness and  Hospital Course:     Kindly see H&P for history of present illness and admission details, please review complete Labs, Consult reports and Test reports for all details in brief  HPI  from the history and physical done on the day of admission 43 year old male patient with history of GERD comes in because of shortness of breath, cough, fever, admitted to medical floor for community-acquired pneumonia/   Hospital Course   Respiratory failure due to community-acquired pneumonia, admitted to medical floor initially then transferred to ICU because of worsening respiratory distress, needing higher amount of oxygen, patient symptoms of shortness of breath got worse and transfer the patient to ICU.  Patient was on high flow oxygen, gradually decreased to 2 L and transferred out of ICU,, sputum culture did not  show acute infection, blood cultures have been negative patient CT chest showed extensive infiltrates bilaterally,.  Received IV steroids, bronchodilators, discharged home with prednisone dose taper.  Patient received fluid treatment, antibiotics for pneumonia and did not require any antibiotics at discharge.  Can continue albuterol, given prescription.  Patient was not on  Any  home medicines.  Discharge Condition: Stable   Follow UP  Follow-up Information    Sherron Mondayejan-Sie, S Ahmed, MD. Schedule an appointment as soon as possible for a visit in 2 day(s).   Specialty:  Internal Medicine Contact information: [address] [place] [address] [phone]             Discharge Instructions  and  Discharge Medications     Allergies as of 05/28/2018      Reactions   Pantoprazole Hives   Penicillins Hives      Medication List    STOP taking these medications   esomeprazole 40 MG packet Commonly known as:  NEXIUM     TAKE these medications   albuterol 108 (90 Base) MCG/ACT inhaler Commonly known as:  PROVENTIL HFA;VENTOLIN HFA Inhale 2 puffs into the lungs every 4 (four) hours as needed for wheezing or shortness of breath.   guaiFENesin-codeine 100-10 MG/5ML syrup Take 5 mLs by mouth every 6 (six) hours as needed for cough.   ibuprofen 800 MG tablet Commonly known as:  ADVIL,MOTRIN Take 800 mg by mouth every 6 (six) hours as needed.   ipratropium-albuterol 0.5-2.5 (3) MG/3ML Soln Commonly known as:  DUONEB Take 3 mLs by nebulization every 6 (six) hours.   predniSONE 10 MG (21) Tbpk tablet Commonly known as:  STERAPRED UNI-PAK 21 TAB Taper  By 10 mg daily  Diet and Activity recommendation: See Discharge Instructions above   Consults obtained -pulmonology, case management   Major procedures and Radiology Reports - PLEASE review detailed and final reports for all details, in brief -     Dg Chest 2 View  Result Date: 05/19/2018 CLINICAL DATA:   Cough and fever; shortness of breath EXAM: CHEST - 2 VIEW COMPARISON:  January 22, 2018 FINDINGS: There is patchy airspace opacity in the lung bases, slightly more on the left than on the right. Lungs elsewhere are clear. Heart is borderline enlarged with slight pulmonary venous hypertension. No adenopathy. No bone lesions. IMPRESSION: Patchy infiltrate in the bases, felt to represent pneumonia. Pulmonary vascular congestion. Electronically Signed   By: Bretta Bang III M.D.   On: 05/19/2018 08:19   Ct Chest Wo Contrast  Result Date: 05/21/2018 CLINICAL DATA:  43 year old with worsening hypoxemic respiratory failure. Community-acquired pneumonia on antibiotics. EXAM: CT CHEST WITHOUT CONTRAST TECHNIQUE: Multidetector CT imaging of the chest was performed following the standard protocol without IV contrast. COMPARISON:  Prior radiographs, most recently done today. FINDINGS: Cardiovascular: No significant vascular findings identified on noncontrast imaging. The heart is mildly enlarged. No significant pericardial effusion. Mediastinum/Nodes: There are no enlarged mediastinal, hilar or axillary lymph nodes.There is a 9 mm AP window node on image 47/2. Hilar assessment is limited by the lack of intravenous contrast. The thyroid gland, trachea and esophagus demonstrate no significant findings. Lungs/Pleura: Trace pleural fluid bilaterally. There is extensive lower lobe consolidation with air bronchograms bilaterally. Additional patchy airspace and ground-glass opacities are present in both upper lobes and in the right middle lobe. No focal mass or endobronchial lesion identified. Upper abdomen: Borderline hepatic steatosis without focal abnormality on noncontrast imaging. Musculoskeletal/Chest wall: There is no chest wall mass or suspicious osseous finding. IMPRESSION: 1. Multifocal ground-glass and airspace opacities in both lungs with areas of consolidation in both lower lobes and the right middle lobe, most  consistent with multilobar pneumonia. These findings appear mildly progressive on recent chest radiographs. 2. No significant pleural effusion, lung mass or significant adenopathy identified. Electronically Signed   By: Carey Bullocks M.D.   On: 05/21/2018 10:44   Dg Chest Port 1 View  Result Date: 05/28/2018 CLINICAL DATA:  Shortness of breath. EXAM: PORTABLE CHEST 1 VIEW COMPARISON:  Radiograph of May 23, 2018. FINDINGS: Stable cardiomegaly. No pneumothorax is noted. Moderate right pleural effusion is noted with associated atelectasis or infiltrate. Left lung is unremarkable. Bony thorax appears normal. IMPRESSION: Moderate right pleural effusion is noted with associated atelectasis or infiltrate. Electronically Signed   By: Lupita Raider, M.D.   On: 05/28/2018 12:18   Dg Chest Port 1 View  Result Date: 05/23/2018 CLINICAL DATA:  Pneumonia EXAM: PORTABLE CHEST 1 VIEW COMPARISON:  05/21/2018 FINDINGS: Moderate cardiomegaly. Diffuse but predominately central and basilar airspace disease. Small right pleural effusion. No pneumothorax. IMPRESSION: Bilateral airspace disease is nonspecific. Consider ARDS, pulmonary edema, or bilateral pneumonia. Small right pleural effusion. Electronically Signed   By: Jolaine Click M.D.   On: 05/23/2018 11:04   Dg Chest Port 1 View  Result Date: 05/21/2018 CLINICAL DATA:  Acute onset of respiratory failure. EXAM: PORTABLE CHEST 1 VIEW COMPARISON:  Chest radiograph performed 05/20/2018 FINDINGS: The lungs are well-aerated. Small bilateral pleural effusions are noted. Patchy bilateral airspace opacification is concerning for pneumonia, more focal than on the prior study. There is no evidence of pleural effusion or pneumothorax. The cardiomediastinal silhouette is mildly enlarged. No acute osseous abnormalities are seen.  IMPRESSION: 1. Small bilateral pleural effusions noted. Patchy bilateral airspace opacification is concerning for pneumonia, more focal than on the prior  study. Followup PA and lateral chest X-ray is recommended in 3-4 weeks following trial of antibiotic therapy to ensure resolution and exclude underlying malignancy. 2. Mild cardiomegaly. Electronically Signed   By: Roanna Raider M.D.   On: 05/21/2018 01:31   Dg Chest Port 1 View  Result Date: 05/20/2018 CLINICAL DATA:  Respiratory failure EXAM: PORTABLE CHEST 1 VIEW COMPARISON:  05/19/2018 FINDINGS: Moderate cardiomegaly with right-greater-than-left basilar predominant airspace opacity and right pleural effusion. Pulmonary edema suspected. IMPRESSION: Cardiomegaly, right pleural effusion and likely pulmonary edema, suggesting congestive heart failure. Electronically Signed   By: Deatra Robinson M.D.   On: 05/20/2018 04:49   Dg Chest Port 1 View  Result Date: 05/19/2018 CLINICAL DATA:  Onset of respiratory failure 1 hour prior to presentation. Patient has been transferred to the ICU. EXAM: PORTABLE CHEST 1 VIEW COMPARISON:  PA and lateral chest x-ray of May 19, 2018 FINDINGS: There is increased density at the right lung base as compared to the earlier study today. There is patchy increased density lateral to the left heart border which is new as well. The upper lobes are clear. The cardiac silhouette remains enlarged. The central pulmonary vascularity is somewhat more engorged. IMPRESSION: Increased density at the right lung base and in the left mid and lower lung may reflect atelectasis or worsening of pneumonia given the sudden change since this morning. No overt pulmonary edema although there is mild central pulmonary vascular prominence and mild cardiomegaly. Electronically Signed   By: David  [place] M.D.   On: 05/19/2018 16:32    Micro Results   No results found for this or any previous visit (from the past 240 hour(s)).     Today   Subjective:   Brett [name] today has no headache,no chest abdominal pain,no new weakness tingling or numbness, feels much better wants to go home today.    Objective:   Blood pressure 135/89, pulse (!) 102, temperature 98 F (36.7 C), temperature source Oral, resp. rate 14, height 6' (1.829 m), weight 93.9 kg, SpO2 92 %.  No intake or output data in the 24 hours ending 06/03/18 2151  Exam Awake Alert, Oriented x 3, No new F.N deficits, Normal affect [place].AT,PERRAL Supple Neck,No JVD, No cervical lymphadenopathy appriciated.  Symmetrical Chest wall movement, Good air movement bilaterally, CTAB RRR,No Gallops,Rubs or new Murmurs, No Parasternal Heave +ve B.Sounds, Abd Soft, Non tender, No organomegaly appriciated, No rebound -guarding or rigidity. No Cyanosis, Clubbing or edema, No new Rash or bruise  Data Review   CBC w Diff:  Lab Results  Component Value Date   WBC 12.7 (H) 05/26/2018   HGB 12.2 (L) 05/26/2018   HCT 38.1 (L) 05/26/2018   PLT 278 05/26/2018   LYMPHOPCT 9 05/23/2018   MONOPCT 11 05/23/2018   EOSPCT 0 05/23/2018   BASOPCT 0 05/23/2018    CMP:  Lab Results  Component Value Date   NA 135 05/26/2018   K 4.0 05/28/2018   CL 98 05/26/2018   CO2 28 05/26/2018   BUN 13 05/26/2018   CREATININE 0.82 05/26/2018   CREATININE 1.18 05/07/2013   PROT 7.3 05/19/2018   ALBUMIN 3.9 05/19/2018   BILITOT 1.5 (H) 05/19/2018   ALKPHOS 69 05/19/2018   AST 32 05/19/2018   ALT 47 (H) 05/19/2018  .   Total Time in preparing paper work, data evaluation and todays exam -  35 minutes  Katha HammingSnehalatha Afia Messenger M.D on 1/09//2020 at 9:51 PM    Note: This dictation was prepared with Dragon dictation along with smaller phrase technology. Any transcriptional errors that result from this process are unintentional.

## 2018-06-12 ENCOUNTER — Ambulatory Visit: Payer: BLUE CROSS/BLUE SHIELD | Admitting: Internal Medicine

## 2018-06-12 ENCOUNTER — Encounter: Payer: Self-pay | Admitting: Internal Medicine

## 2018-06-12 VITALS — BP 102/70 | HR 110 | Resp 16 | Ht 72.0 in | Wt 195.0 lb

## 2018-06-12 DIAGNOSIS — J181 Lobar pneumonia, unspecified organism: Secondary | ICD-10-CM

## 2018-06-12 NOTE — Patient Instructions (Signed)
Continue NEBULIZER AS NEEDED  Avoid sick contacts   Patient may return back to work as tolerated

## 2018-06-12 NOTE — Progress Notes (Signed)
   Name: Brett [name] MRN: [id number] DOB: 1976-04-25     CONSULTATION DATE: 05/29/18 REFERRING MD : mody   STUDIES:     CXR independently reviewed by Me    05/21/18 CT chest Independently reviewed by Me   B/l opacities-multifocal interpretation multifocal pneumonia   CHIEF COMPLAINT: follow up pneumonia and SOB HISTORY OF PRESENT ILLNESS: Patient follow up for pneumonia Feels much better since admission and since last OV Finished prednisone Using NEB as needed No cough No wheezing No signs of infection  Ambulating PULSE ox WNL at this time   Review of Systems:  Gen:  Denies  fever, sweats, chills weigh loss  HEENT: Denies blurred vision, double vision, ear pain, eye pain, hearing loss, nose bleeds, sore throat Cardiac:  No dizziness, chest pain or heaviness, chest tightness,edema, No JVD Resp:   No cough, -sputum production, -shortness of breath,-wheezing, -hemoptysis,  Gi: Denies swallowing difficulty, stomach pain, nausea or vomiting, diarrhea, constipation, bowel incontinence Gu:  Denies bladder incontinence, burning urine Ext:   Denies Joint pain, stiffness or swelling Skin: Denies  skin rash, easy bruising or bleeding or hives Endoc:  Denies polyuria, polydipsia , polyphagia or weight change Psych:   Denies depression, insomnia or hallucinations  Other:  All other systems negative   BP 102/70 (BP Location: Left Arm, Cuff Size: Normal)   Pulse (!) 110   Resp 16   Ht 6' (1.829 m)   Wt 195 lb (88.5 kg)   SpO2 94%   BMI 26.45 kg/m   Physical Examination:   GENERAL:NAD, no fevers, chills, no weakness no fatigue HEAD: Normocephalic, atraumatic.  EYES: PERLA, EOMI No scleral icterus.  MOUTH: Moist mucosal membrane.  EAR, NOSE, THROAT: Clear without exudates. No external lesions.  NECK: Supple. No thyromegaly.  No JVD.  PULMONARY: CTA B/L no wheezing, rhonchi, crackles CARDIOVASCULAR: S1 and S2. Regular rate and rhythm. No murmurs GASTROINTESTINAL: Soft,  nontender, nondistended. Positive bowel sounds.  MUSCULOSKELETAL: No swelling, clubbing, or edema.  NEUROLOGIC: No gross focal neurological deficits. 5/5 strength all extremities SKIN: No ulceration, lesions, rashes, or cyanosis.  PSYCHIATRIC: Insight, judgment intact. -depression -anxiety ALL OTHER ROS ARE NEGATIVE         ASSESSMENT / PLAN:  43 yo pleasant AAM with h/o b/l infiltrates c/w pneumonia  Follow up at this time shows marked improvement, I have checked ambulating pulse ox and patient did NOT drop o2 sats  At this time, I do NOT recommend CXR, or steroids or ABX  Recsomnend stopping oxygen therapy at this time  Continue incentive spirometry and flutter valve 10-15 times per day    Patient satisfied with Plan of action and management. All questions answered Follow up in 3 months   Joushua Dugar Santiago Glad, M.D.  [facility]  Medical Director [facility] Medical Director [facility] Cardio-Pulmonary Department

## 2018-06-15 NOTE — Addendum Note (Signed)
Addended by: Kerin Ransom on: 06/15/2018 10:39 AM   Modules accepted: Orders

## 2018-07-21 ENCOUNTER — Ambulatory Visit
Admission: RE | Admit: 2018-07-21 | Discharge: 2018-07-21 | Disposition: A | Discharge status: Home / Self Care | Payer: BLUE CROSS/BLUE SHIELD | Source: Ambulatory Visit | Attending: Internal Medicine | Admitting: Internal Medicine

## 2018-07-21 ENCOUNTER — Other Ambulatory Visit: Payer: Self-pay

## 2018-07-21 ENCOUNTER — Ambulatory Visit: Payer: BLUE CROSS/BLUE SHIELD | Admitting: Internal Medicine

## 2018-07-21 ENCOUNTER — Encounter: Payer: Self-pay | Admitting: Internal Medicine

## 2018-07-21 ENCOUNTER — Telehealth: Payer: Self-pay

## 2018-07-21 VITALS — BP 118/70 | HR 84 | Ht 72.0 in | Wt 200.0 lb

## 2018-07-21 DIAGNOSIS — J90 Pleural effusion, not elsewhere classified: Secondary | ICD-10-CM | POA: Diagnosis not present

## 2018-07-21 DIAGNOSIS — J181 Lobar pneumonia, unspecified organism: Secondary | ICD-10-CM | POA: Diagnosis present

## 2018-07-21 NOTE — Telephone Encounter (Signed)
Spoke to patient, he is going to get a CXR before he comes in today.

## 2018-07-21 NOTE — Patient Instructions (Signed)
Continue Motrin and Tylenol as needed for pain  Albuterol 1 puff every 4-6 hrs as needed

## 2018-07-21 NOTE — Progress Notes (Signed)
   Name: Brett [name] MRN: [id number] DOB: [birth year]     CONSULTATION DATE: 05/29/18 REFERRING MD : mody   STUDIES:     CXR independently reviewed by Me    05/21/18 CT chest Independently reviewed by Me   B/l opacities-multifocal interpretation multifocal pneumonia   CHIEF COMPLAINT: Follow-up pneumonia HISTORY OF PRESENT ILLNESS: Patient feels much better Has some pleurisy with inspiration No shortness of breath No chest pain No cough No signs or symptoms of infection at this time  Patient uses albuterol as needed Patient had follow-up chest x-ray today which shows significant improvement of bilateral infiltrates with a very small residual right-sided pleural effusion  No intervention needed at this time   Review of Systems:  Gen:  Denies  fever, sweats, chills weigh loss  HEENT: Denies blurred vision, double vision, ear pain, eye pain, hearing loss, nose bleeds, sore throat Cardiac:  No dizziness, chest pain or heaviness, chest tightness,edema, No JVD Resp:   No cough, -sputum production, -shortness of breath,-wheezing, -hemoptysis, +pleurisy Gi: Denies swallowing difficulty, stomach pain, nausea or vomiting, diarrhea, constipation, bowel incontinence Gu:  Denies bladder incontinence, burning urine Ext:   Denies Joint pain, stiffness or swelling Skin: Denies  skin rash, easy bruising or bleeding or hives Endoc:  Denies polyuria, polydipsia , polyphagia or weight change Psych:   Denies depression, insomnia or hallucinations  Other:  All other systems negative    BP 118/70 (BP Location: Left Arm, Cuff Size: Normal)   Pulse 84   Ht 6' (1.829 m)   Wt 200 lb (90.7 kg)   SpO2 97%   BMI 27.12 kg/m   Physical Examination:   GENERAL:NAD, no fevers, chills, no weakness no fatigue HEAD: Normocephalic, atraumatic.  EYES: PERLA, EOMI No scleral icterus.  MOUTH: Moist mucosal membrane.  EAR, NOSE, THROAT: Clear without exudates. No external lesions.  NECK: Supple. No  thyromegaly.  No JVD.  PULMONARY: CTA B/L no wheezing, rhonchi, crackles CARDIOVASCULAR: S1 and S2. Regular rate and rhythm. No murmurs GASTROINTESTINAL: Soft, nontender, nondistended. Positive bowel sounds.  MUSCULOSKELETAL: No swelling, clubbing, or edema.  NEUROLOGIC: No gross focal neurological deficits. 5/5 strength all extremities SKIN: No ulceration, lesions, rashes, or cyanosis.  PSYCHIATRIC: Insight, judgment intact. -depression -anxiety ALL OTHER ROS ARE NEGATIVE             ASSESSMENT / PLAN:  Follow-up bilateral pneumonia-no symptoms at this time Follow-up chest x-ray shows small residual right-sided pleural effusion No signs of infection at this time Uses albuterol as needed Continue incentive spirometry and flutter valve 10-15 times per day  No indication for steroids or antibiotics at this time Patient has been weaned off oxygen Pleurisy is treated with Motrin and Tylenol as needed   Lucie Leather, M.D.  [facility]  Medical Director [facility] Medical Director [facility] Cardio-Pulmonary Department

## 2019-06-18 ENCOUNTER — Encounter: Payer: Self-pay | Admitting: Emergency Medicine

## 2019-06-18 ENCOUNTER — Emergency Department: Payer: BC Managed Care – PPO

## 2019-06-18 ENCOUNTER — Other Ambulatory Visit: Payer: Self-pay

## 2019-06-18 ENCOUNTER — Emergency Department
Admission: EM | Admit: 2019-06-18 | Discharge: 2019-06-18 | Disposition: A | Discharge status: Home / Self Care | Payer: BC Managed Care – PPO | Attending: Emergency Medicine | Admitting: Emergency Medicine

## 2019-06-18 DIAGNOSIS — Z87891 Personal history of nicotine dependence: Secondary | ICD-10-CM | POA: Insufficient documentation

## 2019-06-18 DIAGNOSIS — Z79899 Other long term (current) drug therapy: Secondary | ICD-10-CM | POA: Diagnosis not present

## 2019-06-18 DIAGNOSIS — G43909 Migraine, unspecified, not intractable, without status migrainosus: Secondary | ICD-10-CM | POA: Diagnosis present

## 2019-06-18 DIAGNOSIS — G43819 Other migraine, intractable, without status migrainosus: Secondary | ICD-10-CM | POA: Diagnosis not present

## 2019-06-18 LAB — BASIC METABOLIC PANEL
Anion gap: 6 (ref 5–15)
BUN: 18 mg/dL (ref 6–20)
CO2: 25 mmol/L (ref 22–32)
Calcium: 9 mg/dL (ref 8.9–10.3)
Chloride: 109 mmol/L (ref 98–111)
Creatinine, Ser: 0.96 mg/dL (ref 0.61–1.24)
GFR calc Af Amer: >60 mL/min (ref >60)
GFR calc non Af Amer: >60 mL/min (ref >60)
Glucose, Bld: 119 mg/dL — ABNORMAL HIGH (ref 70–99)
Potassium: 3.9 mmol/L (ref 3.5–5.1)
Sodium: 140 mmol/L (ref 135–145)

## 2019-06-18 LAB — CBC WITH DIFFERENTIAL/PLATELET
Abs Immature Granulocytes: 0.01 10*3/uL (ref 0.00–0.07)
Basophils Absolute: 0 10*3/uL (ref 0.0–0.1)
Basophils Relative: 1 %
Eosinophils Absolute: 0.2 10*3/uL (ref 0.0–0.5)
Eosinophils Relative: 3 %
HCT: 46.7 % (ref 39.0–52.0)
Hemoglobin: 15.1 g/dL (ref 13.0–17.0)
Immature Granulocytes: 0 %
Lymphocytes Relative: 26 %
Lymphs Abs: 1.3 10*3/uL (ref 0.7–4.0)
MCH: 29.6 pg (ref 26.0–34.0)
MCHC: 32.3 g/dL (ref 30.0–36.0)
MCV: 91.6 fL (ref 80.0–100.0)
Monocytes Absolute: 0.5 10*3/uL (ref 0.1–1.0)
Monocytes Relative: 10 %
Neutro Abs: 2.8 10*3/uL (ref 1.7–7.7)
Neutrophils Relative %: 60 %
Platelets: 156 10*3/uL (ref 150–400)
RBC: 5.1 MIL/uL (ref 4.22–5.81)
RDW: 12.6 % (ref 11.5–15.5)
WBC: 4.8 10*3/uL (ref 4.0–10.5)
nRBC: 0 % (ref 0.0–0.2)

## 2019-06-18 MED ORDER — DIPHENHYDRAMINE HCL 50 MG/ML IJ SOLN
12.5000 mg | Freq: Once | INTRAMUSCULAR | Status: AC
  Administered 2019-06-18: 12.5 mg via INTRAVENOUS
  Filled 2019-06-18: qty 1

## 2019-06-18 MED ORDER — SODIUM CHLORIDE 0.9 % IV BOLUS
1000.0000 mL | Freq: Once | INTRAVENOUS | Status: AC
  Administered 2019-06-18: 1000 mL via INTRAVENOUS

## 2019-06-18 MED ORDER — ACETAMINOPHEN 500 MG PO TABS
1000.0000 mg | ORAL_TABLET | Freq: Once | ORAL | Status: AC
  Administered 2019-06-18: 1000 mg via ORAL
  Filled 2019-06-18: qty 2

## 2019-06-18 MED ORDER — METOCLOPRAMIDE HCL 5 MG/ML IJ SOLN
10.0000 mg | Freq: Once | INTRAMUSCULAR | Status: AC
  Administered 2019-06-18: 10 mg via INTRAVENOUS
  Filled 2019-06-18: qty 2

## 2019-06-18 NOTE — ED Provider Notes (Signed)
[facility] Emergency Department Provider Note  ____________________________________________   First MD Initiated Contact with Patient 06/18/19 [phone]     (approximate)  I have reviewed the triage vital signs and the nursing notes.   HISTORY  Chief Complaint Migraine    HPI Brett [name] is a 44 y.o. male with acid reflux, prior pneumonia requiring ICU admission 1 year ago who comes in for headache.  Patient states has had intermittent headaches previously but is not had any recently.  Had a CT of his head done 10 years ago.  States that he has had a headache that is now going on for 5 days, constant, nothing makes it better including Tylenol and Excedrin, nothing makes it worse.  States his pain gradually worsening was not sudden or thunderclap in onset.  Denies any vision changes, chest pain, shortness of breath, changes to speech, focal weakness.  Denies any tick bites.          Past Medical History:  Diagnosis Date  . Acid reflux   . Speech impediment     Patient Active Problem List   Diagnosis Date Noted  . PNA (pneumonia) 05/19/2018  . Sprain of wrist 08/11/2017  . Tremor 05/07/2013    Past Surgical History:  Procedure Laterality Date  . TONSILLECTOMY      Prior to Admission medications   Medication Sig Start Date End Date Taking? Authorizing Provider  albuterol (PROVENTIL HFA;VENTOLIN HFA) 108 (90 Base) MCG/ACT inhaler Inhale 2 puffs into the lungs every 4 (four) hours as needed for wheezing or shortness of breath. 05/28/18   Katha Hamming, MD  guaiFENesin-codeine 100-10 MG/5ML syrup Take 5 mLs by mouth every 6 (six) hours as needed for cough. 05/17/18   Tommi Rumps, PA-C  ibuprofen (ADVIL,MOTRIN) 800 MG tablet Take 800 mg by mouth every 6 (six) hours as needed.    [provider]  ipratropium-albuterol (DUONEB) 0.5-2.5 (3) MG/3ML SOLN Take 3 mLs by nebulization every 6 (six) hours. 05/28/18   Katha Hamming, MD     Allergies Pantoprazole and Penicillins  Family History  Problem Relation Age of Onset  . Healthy Mother   . Pneumonia Father   . Diabetes Maternal Grandmother     Social History Social History   Tobacco Use  . Smoking status: Former Smoker    Packs/day: 0.00    Years: 0.50    Pack years: 0.00    Types: Cigars    Quit date: 2018    Years since quitting: 3.0  . Smokeless tobacco: Never Used  . Tobacco comment: smokes 1-2 cigars daily for 0.5 year--07/21/18   Substance Use Topics  . Alcohol use: Yes    Comment: occasionally   . Drug use: Not Currently    Types: Marijuana    Comment: quit 2017      Review of Systems Constitutional: No fever/chills Eyes: No visual changes. ENT: No sore throat. Cardiovascular: Denies chest pain. Respiratory: Denies shortness of breath. Gastrointestinal: No abdominal pain.  No nausea, no vomiting.  No diarrhea.  No constipation. Genitourinary: Negative for dysuria. Musculoskeletal: Negative for back pain. Skin: Negative for rash. Neurological: Positive for headache, no focal weakness or numbness. All other ROS negative ____________________________________________   PHYSICAL EXAM:  VITAL SIGNS: ED Triage Vitals  Enc Vitals Group     BP 06/18/19 0223 124/83     Pulse Rate 06/18/19 0223 64     Resp 06/18/19 0223 18     Temp 06/18/19 0223 97.9 F (  36.6 C)     Temp Source 06/18/19 0223 Oral     SpO2 06/18/19 0223 99 %     Weight 06/18/19 0223 200 lb (90.7 kg)     Height 06/18/19 0223 6' (1.829 m)     Head Circumference --      Peak Flow --      Pain Score 06/18/19 0231 10     Pain Loc --      Pain Edu? --      Excl. in [place]? --     Constitutional: Alert and oriented. Well appearing and in no acute distress. Eyes: Conjunctivae are normal. EOMI. Head: Atraumatic. Nose: No congestion/rhinnorhea. Mouth/Throat: Mucous membranes are moist.   Neck: No stridor. Trachea Midline. FROM Cardiovascular: Normal rate, regular  rhythm. Grossly normal heart sounds.  Good peripheral circulation. Respiratory: Normal respiratory effort.  No retractions. Lungs CTAB. Gastrointestinal: Soft and nontender. No distention. No abdominal bruits.  Musculoskeletal: No lower extremity tenderness nor edema.  No joint effusions. Neurologic:  Normal speech and language. No gross focal neurologic deficits are appreciated.  Cranial nerves II through XII are intact.  Equal strength in arms and legs. Skin:  Skin is warm, dry and intact. No rash noted. Psychiatric: Mood and affect are normal. Speech and behavior are normal. GU: Deferred   ____________________________________________   LABS (all labs ordered are listed, but only abnormal results are displayed)  Labs Reviewed  BASIC METABOLIC PANEL - Abnormal; Notable for the following components:      Result Value   Glucose, Bld 119 (*)    All other components within normal limits  CBC WITH DIFFERENTIAL/PLATELET   ____________________________________________  RADIOLOGY   Official radiology report(s): CT Head Wo Contrast  Result Date: 06/18/2019 CLINICAL DATA:  Migraine EXAM: CT HEAD WITHOUT CONTRAST TECHNIQUE: Contiguous axial images were obtained from the base of the skull through the vertex without intravenous contrast. COMPARISON:  CT head 04/02/2003 FINDINGS: Brain: No evidence of acute infarction, hemorrhage, hydrocephalus, extra-axial collection or mass lesion/mass effect. Stable punctate hyperattenuating focus along the mesial left temporal lobe may be surface dural calcification or vascular calcium, unchanged since 2004. Vascular: No hyperdense vessel or unexpected calcification. Skull: No calvarial fracture or suspicious osseous lesion. No scalp swelling or hematoma. Sinuses/Orbits: Paranasal sinuses and mastoid air cells are predominantly clear. Included orbital structures are unremarkable. Other: Extensive debris in the external auditory canals. IMPRESSION: No acute  intracranial abnormality. Extensive debris in the external auditory canals, correlate for cerumen impaction. Electronically Signed   By: Lovena Le M.D.   On: 06/18/2019 04:46    ____________________________________________   PROCEDURES  Procedure(s) performed (including Critical Care):  Procedures   ____________________________________________   INITIAL IMPRESSION / ASSESSMENT AND PLAN / ED COURSE  Brett [name] was evaluated in Emergency Department on 06/18/2019 for the symptoms described in the history of present illness. He was evaluated in the context of the global COVID-19 pandemic, which necessitated consideration that the patient might be at risk for infection with the SARS-CoV-2 virus that causes COVID-19. Institutional protocols and algorithms that pertain to the evaluation of patients at risk for COVID-19 are in a state of rapid change based on information released by regulatory bodies including the CDC and federal and state organizations. These policies and algorithms were followed during the patient's care in the ED.    Patient is a well-appearing 44 year old who comes in for continuous headache.  Patient is otherwise well-appearing with normal vital signs.  Low suspicion for meningitis.  Discussed  with patient and he would like to get a CT scan to make sure there is no evidence of tumor/bleed.  Low suspicion for subarachnoid given not sudden or severe in onset.  Sounds more consistent with migraine.  No vision changes to suggest pseudotumor cerebri.  Labs are reassuring.  CT head was negative.  Does show some debris in his ears.  Patient does have earwax bilaterally which we discussed management for.  Patient's headache is now resolved.  Declines any further medications at this time.  Patient feels comfortable with discharge home.  I discussed the provisional nature of ED diagnosis, the treatment so far, the ongoing plan of care, follow up appointments and return precautions  with the patient and any family or support people present. They expressed understanding and agreed with the plan, discharged home.   ____________________________________________   FINAL CLINICAL IMPRESSION(S) / ED DIAGNOSES   Final diagnoses:  Other migraine without status migrainosus, intractable      MEDICATIONS GIVEN DURING THIS VISIT:  Medications  metoCLOPramide (REGLAN) injection 10 mg (10 mg Intravenous Given 06/18/19 0430)  diphenhydrAMINE (BENADRYL) injection 12.5 mg (12.5 mg Intravenous Given 06/18/19 0430)  acetaminophen (TYLENOL) tablet 1,000 mg (1,000 mg Oral Given 06/18/19 0429)  sodium chloride 0.9 % bolus 1,000 mL (1,000 mLs Intravenous New Bag/Given 06/18/19 0431)     ED Discharge Orders    None       Note:  This document was prepared using Dragon voice recognition software and may include unintentional dictation errors.   Concha Se, MD 06/18/19 [phone]

## 2019-06-18 NOTE — ED Triage Notes (Signed)
Patient comes in for migraine headache that has been going on for a couple of days. Patient has been taking ibuprofen and Excedrin migraine and nothing is touching it. Patient states pain is 10/10 right now. Denies nausea, light sensitivity and dizziness

## 2019-06-18 NOTE — Discharge Instructions (Addendum)
CT imaging was negative.  You get the over-the-counter earwax remover.  You can take Tylenol 1 g every 8 hours and ibuprofen 400 every 6 hours to help with your pain in your head if it comes back.  Return to the ER for fevers or any other concerns IMPRESSION:  No acute intracranial abnormality.   Extensive debris in the external auditory canals, correlate for  cerumen impaction.

## 2019-06-22 ENCOUNTER — Other Ambulatory Visit: Payer: Self-pay | Admitting: Internal Medicine

## 2019-06-22 DIAGNOSIS — R519 Headache, unspecified: Secondary | ICD-10-CM

## 2019-07-05 ENCOUNTER — Other Ambulatory Visit: Payer: Self-pay

## 2019-07-05 ENCOUNTER — Ambulatory Visit
Admission: RE | Admit: 2019-07-05 | Discharge: 2019-07-05 | Disposition: A | Discharge status: Home / Self Care | Payer: BC Managed Care – PPO | Source: Ambulatory Visit | Attending: Internal Medicine | Admitting: Internal Medicine

## 2019-07-05 DIAGNOSIS — R519 Headache, unspecified: Secondary | ICD-10-CM | POA: Diagnosis present

## 2019-07-05 DIAGNOSIS — G8929 Other chronic pain: Secondary | ICD-10-CM | POA: Diagnosis present

## 2019-07-05 MED ORDER — GADOBUTROL 1 MMOL/ML IV SOLN
9.0000 mL | Freq: Once | INTRAVENOUS | Status: AC | PRN
  Administered 2019-07-05: 16:00:00 9 mL via INTRAVENOUS

## 2021-03-26 ENCOUNTER — Ambulatory Visit (INDEPENDENT_AMBULATORY_CARE_PROVIDER_SITE_OTHER): Payer: BC Managed Care – PPO

## 2021-03-26 ENCOUNTER — Ambulatory Visit
Admission: EM | Admit: 2021-03-26 | Discharge: 2021-03-26 | Disposition: A | Discharge status: Home / Self Care | Payer: BC Managed Care – PPO

## 2021-03-26 ENCOUNTER — Other Ambulatory Visit: Payer: Self-pay

## 2021-03-26 ENCOUNTER — Encounter: Payer: Self-pay | Admitting: Emergency Medicine

## 2021-03-26 DIAGNOSIS — J209 Acute bronchitis, unspecified: Secondary | ICD-10-CM | POA: Diagnosis not present

## 2021-03-26 DIAGNOSIS — R059 Cough, unspecified: Secondary | ICD-10-CM | POA: Diagnosis not present

## 2021-03-26 DIAGNOSIS — R051 Acute cough: Secondary | ICD-10-CM | POA: Diagnosis not present

## 2021-03-26 MED ORDER — PREDNISONE 20 MG PO TABS: 40.0000 mg | ORAL_TABLET | Freq: Every day | ORAL | 0 refills | Status: AC

## 2021-03-26 MED ORDER — PSEUDOEPH-BROMPHEN-DM 30-2-10 MG/5ML PO SYRP: 10.0000 mL | ORAL_SOLUTION | Freq: Four times a day (QID) | ORAL | 0 refills | Status: AC | PRN

## 2021-03-26 NOTE — Discharge Instructions (Signed)
-  Normal chest x-ray.  You have a chest cold.  I sent something for cough and prednisone to help open things up and decrease inflammation.  Increase rest and fluids. - If you develop a fever, chest pain or shortness of breath you should be seen again and reevaluated for possibility of pneumonia. - You should feel much better for next week.

## 2021-03-26 NOTE — ED Provider Notes (Signed)
[facility]    CSN: [id number] Arrival date & time: 03/26/21  0936      History   Chief Complaint Chief Complaint  Patient presents with   Cough    HPI Brett [name] is a 45 y.o. male presenting for 2-week history of cough and chest congestion.  Patient also reports that he occasionally has sweats.  He has a history of pneumonia a couple years ago and apparently got very sick at that time requiring oxygen.  Patient is concerned about potential pneumonia again.  He does request a chest x-ray.  He denies any fevers or chills.  No body aches but he has been a bit fatigued.  Admits to nasal congestion.  Admits to chest pain when coughing but not breathing.  No vomiting or diarrhea.  Has taken over-the-counter cough medication.  He is otherwise healthy.  No other complaints.  HPI  Past Medical History:  Diagnosis Date   Acid reflux    Speech impediment     Patient Active Problem List   Diagnosis Date Noted   PNA (pneumonia) 05/19/2018   Sprain of wrist 08/11/2017   Tremor 05/07/2013    Past Surgical History:  Procedure Laterality Date   TONSILLECTOMY         Home Medications    Prior to Admission medications   Medication Sig Start Date End Date Taking? Authorizing Provider  brompheniramine-pseudoephedrine-DM 30-2-10 MG/5ML syrup Take 10 mLs by mouth 4 (four) times daily as needed for up to 7 days. 03/26/21 04/02/21 Yes Shirlee Latch, PA-C  ibuprofen (ADVIL,MOTRIN) 800 MG tablet Take 800 mg by mouth every 6 (six) hours as needed.   Yes [provider]  pantoprazole (PROTONIX) 40 MG tablet Take 40 mg by mouth every morning. 02/28/21  Yes [provider]  predniSONE (DELTASONE) 20 MG tablet Take 2 tablets (40 mg total) by mouth daily for 5 days. 03/26/21 03/31/21 Yes Eusebio Friendly B, PA-C  rosuvastatin (CRESTOR) 10 MG tablet SMARTSIG:1 Tablet(s) By Mouth Every Evening 02/28/21  Yes [provider]  UBRELVY 50 MG TABS Take by mouth.  03/14/21  Yes [provider]  albuterol (PROVENTIL HFA;VENTOLIN HFA) 108 (90 Base) MCG/ACT inhaler Inhale 2 puffs into the lungs every 4 (four) hours as needed for wheezing or shortness of breath. 05/28/18   Katha Hamming, MD  ipratropium-albuterol (DUONEB) 0.5-2.5 (3) MG/3ML SOLN Take 3 mLs by nebulization every 6 (six) hours. 05/28/18   Katha Hamming, MD    Family History Family History  Problem Relation Age of Onset   Healthy Mother    Pneumonia Father    Diabetes Maternal Grandmother     Social History Social History   Tobacco Use   Smoking status: Former    Packs/day: 0.00    Years: 0.50    Pack years: 0.00    Types: Cigars, Cigarettes    Quit date: 2018    Years since quitting: 4.8   Smokeless tobacco: Never   Tobacco comments:    smokes 1-2 cigars daily for 0.5 year--07/21/18   Vaping Use   Vaping Use: Never used  Substance Use Topics   Alcohol use: Yes    Comment: occasionally    Drug use: Not Currently    Types: Marijuana    Comment: quit 2017     Allergies   Penicillins   Review of Systems Review of Systems  Constitutional:  Positive for diaphoresis and fatigue. Negative for fever.  HENT:  Positive for congestion and rhinorrhea. Negative  for sinus pressure, sinus pain and sore throat.   Respiratory:  Positive for cough. Negative for shortness of breath.   Cardiovascular:  Positive for chest pain (when coughing).  Gastrointestinal:  Negative for abdominal pain, diarrhea, nausea and vomiting.  Musculoskeletal:  Negative for myalgias.  Neurological:  Negative for weakness, light-headedness and headaches.  Hematological:  Negative for adenopathy.    Physical Exam Triage Vital Signs ED Triage Vitals  Enc Vitals Group     BP 03/26/21 1032 (!) 135/93     Pulse Rate 03/26/21 1032 73     Resp 03/26/21 1032 18     Temp 03/26/21 1032 99 F (37.2 C)     Temp Source 03/26/21 1032 Oral     SpO2 03/26/21 1032 98 %     Weight 03/26/21 1029  199 lb 15.3 oz (90.7 kg)     Height 03/26/21 1029 6' (1.829 m)     Head Circumference --      Peak Flow --      Pain Score 03/26/21 1028 0     Pain Loc --      Pain Edu? --      Excl. in [place]? --    No data found.  Updated Vital Signs BP (!) 135/93 (BP Location: Right Arm)   Pulse 73   Temp 99 F (37.2 C) (Oral)   Resp 18   Ht 6' (1.829 m)   Wt 199 lb 15.3 oz (90.7 kg)   SpO2 98%   BMI 27.12 kg/m   Physical Exam Vitals and nursing note reviewed.  Constitutional:      General: He is not in acute distress.    Appearance: Normal appearance. He is well-developed. He is not ill-appearing.  HENT:     Head: Normocephalic and atraumatic.     Nose: Congestion present.     Mouth/Throat:     Mouth: Mucous membranes are moist.     Pharynx: Oropharynx is clear.  Eyes:     General: No scleral icterus.    Conjunctiva/sclera: Conjunctivae normal.  Cardiovascular:     Rate and Rhythm: Normal rate and regular rhythm.     Heart sounds: Normal heart sounds.  Pulmonary:     Effort: Pulmonary effort is normal. No respiratory distress.     Breath sounds: Normal breath sounds.  Musculoskeletal:     Cervical back: Neck supple.  Skin:    General: Skin is warm and dry.  Neurological:     General: No focal deficit present.     Mental Status: He is alert. Mental status is at baseline.     Motor: No weakness.     Coordination: Coordination normal.     Gait: Gait normal.  Psychiatric:        Mood and Affect: Mood normal.        Behavior: Behavior normal.        Thought Content: Thought content normal.     UC Treatments / Results  Labs (all labs ordered are listed, but only abnormal results are displayed) Labs Reviewed - No data to display  EKG   Radiology DG Chest 2 View  Result Date: 03/26/2021 CLINICAL DATA:  Cough and congestion EXAM: CHEST - 2 VIEW COMPARISON:  07/21/2018 FINDINGS: Midline trachea. Borderline cardiomegaly. Mediastinal contours otherwise within normal limits.  No pleural effusion or pneumothorax. Mild volume loss and scarring at the right lung base with right hemidiaphragm elevation. Clear left lung. IMPRESSION: No active cardiopulmonary disease. Electronically Signed  By: Jeronimo Greaves M.D.   On: 03/26/2021 12:15    Procedures Procedures (including critical care time)  Medications Ordered in UC Medications - No data to display  Initial Impression / Assessment and Plan / UC Course  I have reviewed the triage vital signs and the nursing notes.  Pertinent labs & imaging results that were available during my care of the patient were reviewed by me and considered in my medical decision making (see chart for details).  45 year old male presenting for 2-week history of cough and congestion.  Request chest x-ray.  Vitals all normal and stable and he is overall well-appearing.  Minor congestion on exam.  Chest is clear to auscultation.  Chest x-ray obtained.  Independently viewed by me.  X-ray reveals no acute cardiopulmonary disease.  I discussed this with patient.  Suspect viral bronchitis.  Treating at this time with prednisone and Bromfed.  Advised increasing rest and fluids.  Reviewed return ED precautions especially if he develops a fever or shortness of breath.   Final Clinical Impressions(s) / UC Diagnoses   Final diagnoses:  Acute bronchitis, unspecified organism  Acute cough     Discharge Instructions      -Normal chest x-ray.  You have a chest cold.  I sent something for cough and prednisone to help open things up and decrease inflammation.  Increase rest and fluids. - If you develop a fever, chest pain or shortness of breath you should be seen again and reevaluated for possibility of pneumonia. - You should feel much better for next week.     ED Prescriptions     Medication Sig Dispense Auth. Provider   predniSONE (DELTASONE) 20 MG tablet Take 2 tablets (40 mg total) by mouth daily for 5 days. 10 tablet Eusebio Friendly B, PA-C    brompheniramine-pseudoephedrine-DM 30-2-10 MG/5ML syrup Take 10 mLs by mouth 4 (four) times daily as needed for up to 7 days. 118 mL Shirlee Latch, PA-C      PDMP not reviewed this encounter.   Shirlee Latch, PA-C 03/26/21 1240

## 2021-03-26 NOTE — ED Triage Notes (Signed)
Pt c/o cough, chest congestion, sweats. Started about 2 weeks ago. He has pain in his chest when he coughs. Denies fever.

## 2021-04-14 ENCOUNTER — Emergency Department: Payer: BC Managed Care – PPO

## 2021-04-14 ENCOUNTER — Emergency Department
Admission: EM | Admit: 2021-04-14 | Discharge: 2021-04-14 | Disposition: A | Discharge status: Home / Self Care | Payer: BC Managed Care – PPO | Attending: Emergency Medicine | Admitting: Emergency Medicine

## 2021-04-14 ENCOUNTER — Encounter: Payer: Self-pay | Admitting: Radiology

## 2021-04-14 ENCOUNTER — Other Ambulatory Visit: Payer: Self-pay

## 2021-04-14 DIAGNOSIS — R079 Chest pain, unspecified: Secondary | ICD-10-CM | POA: Diagnosis present

## 2021-04-14 DIAGNOSIS — R0602 Shortness of breath: Secondary | ICD-10-CM | POA: Diagnosis not present

## 2021-04-14 DIAGNOSIS — Z5321 Procedure and treatment not carried out due to patient leaving prior to being seen by health care provider: Secondary | ICD-10-CM | POA: Insufficient documentation

## 2021-04-14 DIAGNOSIS — R059 Cough, unspecified: Secondary | ICD-10-CM | POA: Diagnosis not present

## 2021-04-14 LAB — COMPREHENSIVE METABOLIC PANEL
ALT: 38 U/L (ref 0–44)
AST: 27 U/L (ref 15–41)
Albumin: 4.1 g/dL (ref 3.5–5.0)
Alkaline Phosphatase: 62 U/L (ref 38–126)
Anion gap: 6 (ref 5–15)
BUN: 16 mg/dL (ref 6–20)
CO2: 25 mmol/L (ref 22–32)
Calcium: 8.6 mg/dL — ABNORMAL LOW (ref 8.9–10.3)
Chloride: 106 mmol/L (ref 98–111)
Creatinine, Ser: 1.3 mg/dL — ABNORMAL HIGH (ref 0.61–1.24)
GFR, Estimated: >60 mL/min (ref >60)
Glucose, Bld: 125 mg/dL — ABNORMAL HIGH (ref 70–99)
Potassium: 3.7 mmol/L (ref 3.5–5.1)
Sodium: 137 mmol/L (ref 135–145)
Total Bilirubin: 0.9 mg/dL (ref 0.3–1.2)
Total Protein: 7 g/dL (ref 6.5–8.1)

## 2021-04-14 LAB — CBC
HCT: 45.5 % (ref 39.0–52.0)
Hemoglobin: 14.9 g/dL (ref 13.0–17.0)
MCH: 30.7 pg (ref 26.0–34.0)
MCHC: 32.7 g/dL (ref 30.0–36.0)
MCV: 93.6 fL (ref 80.0–100.0)
Platelets: 135 10*3/uL — ABNORMAL LOW (ref 150–400)
RBC: 4.86 MIL/uL (ref 4.22–5.81)
RDW: 12.7 % (ref 11.5–15.5)
WBC: 4.3 10*3/uL (ref 4.0–10.5)
nRBC: 0 % (ref 0.0–0.2)

## 2021-04-14 LAB — TROPONIN I (HIGH SENSITIVITY): Troponin I (High Sensitivity): 4 ng/L (ref <18)

## 2021-04-14 NOTE — ED Triage Notes (Signed)
Pt states has had a dry cough for a month. Pt complains of chest pain and shortness of breath. Pt with dry cough noted, denies swelling to legs.

## 2021-04-25 ENCOUNTER — Encounter: Payer: BC Managed Care – PPO | Admitting: Primary Care

## 2021-04-25 NOTE — Patient Instructions (Signed)
   Orders: PFTs   Follow-up: First available with Dr. Belia Heman (15 min) or Dr. Jayme Cloud (30 mins) in [place]

## 2021-04-25 NOTE — Progress Notes (Signed)
 Err   This encounter was created in error - please disregard.

## 2021-05-02 ENCOUNTER — Ambulatory Visit: Payer: BC Managed Care – PPO | Admitting: Acute Care

## 2021-07-03 ENCOUNTER — Encounter: Payer: Self-pay | Admitting: Adult Health

## 2021-07-03 ENCOUNTER — Ambulatory Visit
Admission: RE | Admit: 2021-07-03 | Discharge: 2021-07-03 | Disposition: A | Discharge status: Home / Self Care | Payer: BC Managed Care – PPO | Source: Ambulatory Visit | Attending: Adult Health | Admitting: Adult Health

## 2021-07-03 ENCOUNTER — Other Ambulatory Visit: Payer: Self-pay

## 2021-07-03 ENCOUNTER — Ambulatory Visit: Payer: BC Managed Care – PPO | Admitting: Adult Health

## 2021-07-03 ENCOUNTER — Ambulatory Visit
Admission: RE | Admit: 2021-07-03 | Discharge: 2021-07-03 | Disposition: A | Discharge status: Home / Self Care | Payer: BC Managed Care – PPO | Attending: Adult Health | Admitting: Adult Health

## 2021-07-03 VITALS — BP 112/62 | HR 96 | Temp 98.3°F | Ht 72.0 in | Wt 209.6 lb

## 2021-07-03 DIAGNOSIS — J209 Acute bronchitis, unspecified: Secondary | ICD-10-CM | POA: Insufficient documentation

## 2021-07-03 DIAGNOSIS — R053 Chronic cough: Secondary | ICD-10-CM

## 2021-07-03 MED ORDER — ALBUTEROL SULFATE HFA 108 (90 BASE) MCG/ACT IN AERS: 1.0000 | INHALATION_SPRAY | Freq: Four times a day (QID) | RESPIRATORY_TRACT | 2 refills | Status: DC | PRN

## 2021-07-03 MED ORDER — BENZONATATE 200 MG PO CAPS: 200.0000 mg | ORAL_CAPSULE | Freq: Three times a day (TID) | ORAL | 2 refills | Status: AC | PRN

## 2021-07-03 MED ORDER — DOXYCYCLINE HYCLATE 100 MG PO TABS: 100.0000 mg | ORAL_TABLET | Freq: Two times a day (BID) | ORAL | 0 refills | Status: DC

## 2021-07-03 NOTE — Assessment & Plan Note (Addendum)
Chronic cough x3 months after influenza.  Suspect has a postviral cough/reactive airways.  Patient has had several courses of steroids.  Hold on additional steroids at this time.  We will treat with empiric antibiotics for possible superimposed bronchitis. Check chest x-ray.  Cough control regimen with Delsym and Tessalon.  Control for triggers with reflux and chronic rhinitis treatment regimen If not improved on return.  Follow-up on PFTs.  Consider allergy profile and CBC with differential if indicated   Plan Patient Instructions  Begin Doxycycline 100mg  Twice daily , take with food.  Begin Delsym 2 tsp Twice daily  for cough As needed   Begin Tessalon Three times a day  for cough as needed  Begin Zyrec 10mg  At bedtime   Begin Pepcid 20mg   At bedtime   Albuterol inhaler As needed  wheezing /shortness of breath  Chest xray today .  Follow up with Dr. or Juniel Groene NP in 3-4 weeks with PFT and As needed   Please contact office for sooner follow up if symptoms do not improve or worsen or seek emergency care

## 2021-07-03 NOTE — Progress Notes (Signed)
@Patient  ID: Brett [name], male    DOB: December 16, 1975, 46 y.o.   MRN: [id number]  Chief Complaint  Patient presents with   Follow-up    Referring provider: Jodi Marble, MD  HPI: 46 year old male former smoker seen for pulmonary consult January 2020 for pneumonia   TEST/EVENTS :  CT chest May 21, 2018 extensive lower lobe consolidation with air bronchograms bilaterally groundglass opacities in the upper lobes and right middle lobe  Chest x-ray July 21, 2018 showed significant improvement with near complete resolution of right basilar pneumonia  07/03/2021 Follow up :  Cough  Patient presents for a follow-up visit.  Patient was last seen March 2020 after having pneumonia.  Patient was admitted January 2020 with severe pneumonia and acute respiratory failure.  He was treated with IV antibiotics, steroids and nebulized bronchodilators. He was seen in the office in March 2020.  Chest x-ray showed significant improvement with near resolution of right basilar pneumonia.  Patient says he was doing well until he got the flu in November.  He has been having ongoing cough ever since he had the flu. Patient has been to urgent care and emergency room several times but continues to have ongoing cough.  Chest x-ray was normal.  He was treated with steroids x2 and cough suppressants. Took Zpack at beginning .  Complains of ongoing cough and congestion with thick yellow mucus.  Patient is using over-the-counter cold medicines without much relief.  Does have some sinus drainage.  Denies any significant reflux.  He is taking Protonix daily.  Patient denies any hemoptysis, joint swelling, calf pain, chest pain, orthopnea.  Does get winded at times with heavy activity or going upstairs. O2 saturations today in the office are 100% on room air.   Allergies  Allergen Reactions   Penicillins Hives    Immunization History  Administered Date(s) Administered   Influenza Split 02/23/2018   PFIZER(Purple  Top)SARS-COV-2 Vaccination 08/04/2019, 08/25/2019   Pfizer Covid-19 Vaccine Bivalent Booster 65yrs & up 11/02/2020    Past Medical History:  Diagnosis Date   Acid reflux    Speech impediment     Tobacco History: Social History   Tobacco Use  Smoking Status Former   Packs/day: 0.00   Years: 0.50   Pack years: 0.00   Types: Cigars, Cigarettes   Quit date: 2018   Years since quitting: 5.1  Smokeless Tobacco Never  Tobacco Comments   smokes 1-2 cigars daily for 0.5 year--07/03/2021   Counseling given: Not Answered Tobacco comments: smokes 1-2 cigars daily for 0.5 year--07/03/2021   Outpatient Medications Prior to Visit  Medication Sig Dispense Refill   ibuprofen (ADVIL,MOTRIN) 800 MG tablet Take 800 mg by mouth every 6 (six) hours as needed.     ipratropium-albuterol (DUONEB) 0.5-2.5 (3) MG/3ML SOLN Take 3 mLs by nebulization every 6 (six) hours. 360 mL 0   pantoprazole (PROTONIX) 40 MG tablet Take 40 mg by mouth every morning.     rosuvastatin (CRESTOR) 10 MG tablet SMARTSIG:1 Tablet(s) By Mouth Every Evening     UBRELVY 50 MG TABS Take by mouth.     albuterol (PROVENTIL HFA;VENTOLIN HFA) 108 (90 Base) MCG/ACT inhaler Inhale 2 puffs into the lungs every 4 (four) hours as needed for wheezing or shortness of breath. 1 Inhaler 2   No facility-administered medications prior to visit.     Review of Systems:   Constitutional:   No  weight loss, night sweats,  Fevers, chills,  +fatigue, or  lassitude.  HEENT:   No headaches,  Difficulty swallowing,  Tooth/dental problems, or  Sore throat,                No sneezing, itching, ear ache,  +nasal congestion, post nasal drip,   CV:  No chest pain,  Orthopnea, PND, swelling in lower extremities, anasarca, dizziness, palpitations, syncope.   GI  No heartburn, indigestion, abdominal pain, nausea, vomiting, diarrhea, change in bowel habits, loss of appetite, bloody stools.   Resp: .  No chest wall deformity  Skin: no rash or  lesions.  GU: no dysuria, change in color of urine, no urgency or frequency.  No flank pain, no hematuria   MS:  No joint pain or swelling.  No decreased range of motion.  No back pain.    Physical Exam  BP 112/62 (BP Location: Left Arm, Cuff Size: Normal)    Pulse 96    Temp 98.3 F (36.8 C) (Temporal)    Ht 6' (1.829 m)    Wt 209 lb 9.6 oz (95.1 kg)    SpO2 100%    BMI 28.43 kg/m   GEN: A/Ox3; pleasant , NAD, well nourished    HEENT:  [place]/AT,  NOSE-clear, THROAT-clear, no lesions, no postnasal drip or exudate noted.   NECK:  Supple w/ fair ROM; no JVD; normal carotid impulses w/o bruits; no thyromegaly or nodules palpated; no lymphadenopathy.    RESP  Clear  P & A; w/o, wheezes/ rales/ or rhonchi. no accessory muscle use, no dullness to percussion  CARD:  RRR, no m/r/g, no peripheral edema, pulses intact, no cyanosis or clubbing.  GI:   Soft & nt; nml bowel sounds; no organomegaly or masses detected.   Musco: Warm bil, no deformities or joint swelling noted.   Neuro: alert, no focal deficits noted.    Skin: Warm, no lesions or rashes    Lab Results:  CBC    Component Value Date/Time   WBC 4.3 04/14/2021 1922   RBC 4.86 04/14/2021 1922   HGB 14.9 04/14/2021 1922   HCT 45.5 04/14/2021 1922   PLT 135 (L) 04/14/2021 1922   MCV 93.6 04/14/2021 1922   MCV 101.0 (A) 05/07/2013 1705   MCH 30.7 04/14/2021 1922   MCHC 32.7 04/14/2021 1922   RDW 12.7 04/14/2021 1922   LYMPHSABS 1.3 06/18/2019 0432   MONOABS 0.5 06/18/2019 0432   EOSABS 0.2 06/18/2019 0432   BASOSABS 0.0 06/18/2019 0432    BMET    Component Value Date/Time   NA 137 04/14/2021 1922   K 3.7 04/14/2021 1922   CL 106 04/14/2021 1922   CO2 25 04/14/2021 1922   GLUCOSE 125 (H) 04/14/2021 1922   BUN 16 04/14/2021 1922   CREATININE 1.30 (H) 04/14/2021 1922   CREATININE 1.18 05/07/2013 1704   CALCIUM 8.6 (L) 04/14/2021 1922   GFRNONAA >60 04/14/2021 1922   GFRAA >60 06/18/2019 0432    BNP     Component Value Date/Time   BNP 90.0 05/20/2018 0517    ProBNP No results found for: PROBNP  Imaging: No results found.    No flowsheet data found.  No results found for: NITRICOXIDE      Assessment & Plan:   Acute bronchitis Slow to resolve acute bronchitis with ongoing postviral cough Check chest x-ray today.  Check PFTs on return. We will treat with empiric antibiotics.  Cough control regimen and trigger prevention  Plan  Patient Instructions  Begin Doxycycline 100mg  Twice daily , take with food.  Begin Delsym 2 tsp Twice daily  for cough As needed   Begin Tessalon Three times a day  for cough as needed  Begin Zyrec 10mg  At bedtime   Begin Pepcid 20mg   At bedtime   Albuterol inhaler As needed  wheezing /shortness of breath  Chest xray today .  Follow up with Dr. Mortimer Fries or Zayley Arras NP in 3-4 weeks with PFT and As needed   Please contact office for sooner follow up if symptoms do not improve or worsen or seek emergency care        Chronic cough Chronic cough x3 months after influenza.  Suspect has a postviral cough/reactive airways.  Patient has had several courses of steroids.  Hold on additional steroids at this time.  We will treat with empiric antibiotics for possible superimposed bronchitis. Check chest x-ray.  Cough control regimen with Delsym and Tessalon.  Control for triggers with reflux and chronic rhinitis treatment regimen If not improved on return.  Follow-up on PFTs.  Consider allergy profile and CBC with differential if indicated   Plan Patient Instructions  Begin Doxycycline 100mg  Twice daily , take with food.  Begin Delsym 2 tsp Twice daily  for cough As needed   Begin Tessalon Three times a day  for cough as needed  Begin Zyrec 10mg  At bedtime   Begin Pepcid 20mg   At bedtime   Albuterol inhaler As needed  wheezing /shortness of breath  Chest xray today .  Follow up with Dr. Mortimer Fries or Elle Vezina NP in 3-4 weeks with PFT and As needed   Please  contact office for sooner follow up if symptoms do not improve or worsen or seek emergency care          Rexene Edison, NP 07/03/2021

## 2021-07-03 NOTE — Assessment & Plan Note (Signed)
Slow to resolve acute bronchitis with ongoing postviral cough Check chest x-ray today.  Check PFTs on return. We will treat with empiric antibiotics.  Cough control regimen and trigger prevention  Plan  Patient Instructions  Begin Doxycycline 100mg  Twice daily , take with food.  Begin Delsym 2 tsp Twice daily  for cough As needed   Begin Tessalon Three times a day  for cough as needed  Begin Zyrec 10mg  At bedtime   Begin Pepcid 20mg   At bedtime   Albuterol inhaler As needed  wheezing /shortness of breath  Chest xray today .  Follow up with Dr. or Dewon Mendizabal NP in 3-4 weeks with PFT and As needed   Please contact office for sooner follow up if symptoms do not improve or worsen or seek emergency care

## 2021-07-03 NOTE — Patient Instructions (Addendum)
Begin Doxycycline 100mg  Twice daily , take with food.  Begin Delsym 2 tsp Twice daily  for cough As needed   Begin Tessalon Three times a day  for cough as needed  Begin Zyrec 10mg  At bedtime   Begin Pepcid 20mg   At bedtime   Albuterol inhaler As needed  wheezing /shortness of breath  Chest xray today .  Follow up with Dr. Mortimer Fries or Romy Ipock NP in 3-4 weeks with PFT and As needed   Please contact office for sooner follow up if symptoms do not improve or worsen or seek emergency care

## 2021-07-09 ENCOUNTER — Telehealth: Payer: Self-pay

## 2021-07-09 NOTE — Telephone Encounter (Signed)
Noted.  Will close encounter.  

## 2021-07-09 NOTE — Telephone Encounter (Signed)
Lm for reminder of covid test prior to PFT.  07/12/2021 [facility]

## 2021-07-10 NOTE — Progress Notes (Signed)
Patient has already been contacted, see note from 07/03/2020.

## 2021-07-12 ENCOUNTER — Other Ambulatory Visit: Payer: Self-pay

## 2021-07-12 ENCOUNTER — Other Ambulatory Visit
Admission: RE | Admit: 2021-07-12 | Discharge: 2021-07-12 | Disposition: A | Discharge status: Home / Self Care | Payer: BC Managed Care – PPO | Source: Ambulatory Visit | Attending: Adult Health | Admitting: Adult Health

## 2021-07-12 DIAGNOSIS — Z20822 Contact with and (suspected) exposure to covid-19: Secondary | ICD-10-CM | POA: Insufficient documentation

## 2021-07-12 DIAGNOSIS — Z01812 Encounter for preprocedural laboratory examination: Secondary | ICD-10-CM | POA: Diagnosis present

## 2021-07-12 LAB — SARS CORONAVIRUS 2 (TAT 6-24 HRS): SARS Coronavirus 2: NEGATIVE

## 2021-07-13 ENCOUNTER — Ambulatory Visit: Payer: BC Managed Care – PPO | Attending: Adult Health

## 2021-07-13 DIAGNOSIS — F17290 Nicotine dependence, other tobacco product, uncomplicated: Secondary | ICD-10-CM | POA: Diagnosis not present

## 2021-07-13 DIAGNOSIS — R053 Chronic cough: Secondary | ICD-10-CM

## 2021-07-13 DIAGNOSIS — J984 Other disorders of lung: Secondary | ICD-10-CM | POA: Insufficient documentation

## 2021-08-21 ENCOUNTER — Ambulatory Visit: Payer: BC Managed Care – PPO | Admitting: Primary Care

## 2021-08-21 ENCOUNTER — Encounter: Payer: Self-pay | Admitting: Primary Care

## 2021-08-21 DIAGNOSIS — Z8701 Personal history of pneumonia (recurrent): Secondary | ICD-10-CM | POA: Diagnosis not present

## 2021-08-21 DIAGNOSIS — R053 Chronic cough: Secondary | ICD-10-CM

## 2021-08-21 NOTE — Assessment & Plan Note (Deleted)
-   Stable; CXR on 07/03/21 showed scarring right lower lung, stable right hemidiaphragm ?

## 2021-08-21 NOTE — Progress Notes (Signed)
? ?@Patient  ID: , male    DOB: 1975-08-27, 46 y.o.   MRN: [id number] ? ?Chief Complaint  ?Patient presents with  ? Follow-up  ?  Breathing has improved and his cough has resolved. No new co's. Not using albuterol.   ? ? ?Referring provider: ?[id number], MD ? ?HPI: ?46 year old male, former smoker quit in 2018. PMH significant for PNA, acid reflux, tonsillectomy and speech impediment. Patient of Dr. 2019, last seen in February by pulmonary NP.  ? ?Previous [facility] pulmonary encounter:  ?07/03/2021 Follow up :  Cough  ?Patient presents for a follow-up visit.  Patient was last seen March 2020 after having pneumonia.  Patient was admitted January 2020 with severe pneumonia and acute respiratory failure.  He was treated with IV antibiotics, steroids and nebulized bronchodilators. ?He was seen in the office in March 2020.  Chest x-ray showed significant improvement with near resolution of right basilar pneumonia.  Patient says he was doing well until he got the flu in November.  He has been having ongoing cough ever since he had the flu. ?Patient has been to urgent care and emergency room several times but continues to have ongoing cough.  Chest x-ray was normal.  He was treated with steroids x2 and cough suppressants. Took Zpack at beginning .  Complains of ongoing cough and congestion with thick yellow mucus.  Patient is using over-the-counter cold medicines without much relief.  Does have some sinus drainage.  Denies any significant reflux.  He is taking Protonix daily.  Patient denies any hemoptysis, joint swelling, calf pain, chest pain, orthopnea.  Does get winded at times with heavy activity or going upstairs. ?O2 saturations today in the office are 100% on room air. ? ?08/21/2021- interim hx ?Patient presents today for 6-8 week follow-up. He was seen in February 2023 for chronic cough, treated with doxycyline 100mg  twice daily, Delsym, and tesalon. He is doing a lot better since last visit. Cough has  completely resolved. He has no shortness of breath. CXR on 07/03/21 showed scarring right lower lung, stable right hemidiaphragm elevated; no acute cardiopulmonary process. PFTs showed moderate restrictive lung disease. No evidence of obstruction.  ? ? ?Allergies  ?Allergen Reactions  ? Penicillins Hives  ? ? ?Immunization History  ?Administered Date(s) Administered  ? Influenza Split 02/23/2018  ? PFIZER(Purple Top)SARS-COV-2 Vaccination 08/04/2019, 08/25/2019  ? 08/06/2019 53yrs & up 11/02/2020  ? ? ?Past Medical History:  ?Diagnosis Date  ? Acid reflux   ? Speech impediment   ? ? ?Tobacco History: ?Social History  ? ?Tobacco Use  ?Smoking Status Former  ? Packs/day: 0.00  ? Years: 0.50  ? Pack years: 0.00  ? Types: Cigars, Cigarettes  ? Quit date: 2018  ? Years since quitting: 5.2  ?Smokeless Tobacco Never  ?Tobacco Comments  ? smokes 1-2 cigars daily for 0.5 year--07/03/2021  ? ?Counseling given: Not Answered ?Tobacco comments: smokes 1-2 cigars daily for 0.5 year--07/03/2021 ? ? ?Outpatient Medications Prior to Visit  ?Medication Sig Dispense Refill  ? albuterol (VENTOLIN HFA) 108 (90 Base) MCG/ACT inhaler Inhale 1-2 puffs into the lungs every 6 (six) hours as needed for wheezing or shortness of breath. 1 each 2  ? benzonatate (TESSALON) 200 MG capsule Take 1 capsule (200 mg total) by mouth 3 (three) times daily as needed for cough. 45 capsule 2  ? cetirizine (ZYRTEC) 10 MG tablet Take 10 mg by mouth daily.    ? ibuprofen (ADVIL,MOTRIN) 800  MG tablet Take 800 mg by mouth every 6 (six) hours as needed.    ? ipratropium-albuterol (DUONEB) 0.5-2.5 (3) MG/3ML SOLN Take 3 mLs by nebulization every 6 (six) hours. 360 mL 0  ? pantoprazole (PROTONIX) 40 MG tablet Take 40 mg by mouth every morning.    ? rosuvastatin (CRESTOR) 10 MG tablet SMARTSIG:1 Tablet(s) By Mouth Every Evening    ? UBRELVY 50 MG TABS Take by mouth.    ? doxycycline (VIBRA-TABS) 100 MG tablet Take 1 tablet (100 mg total)  by mouth 2 (two) times daily. 14 tablet 0  ? ?No facility-administered medications prior to visit.  ? ?Review of Systems ? ?Review of Systems  ?Constitutional: Negative.   ?HENT: Negative.    ?Respiratory: Negative.    ? ? ?Physical Exam ? ?BP 122/76 (BP Location: Left Arm, Cuff Size: Normal)   Pulse 81   Temp 98.1 ?F (36.7 ?C) (Oral)   Wt 208 lb (94.3 kg)   SpO2 97% Comment: on RA  BMI 28.21 kg/m?  ?Physical Exam ?Constitutional:   ?   General: He is not in acute distress. ?   Appearance: Normal appearance. He is not ill-appearing.  ?HENT:  ?   Head: Normocephalic and atraumatic.  ?   Mouth/Throat:  ?   Mouth: Mucous membranes are moist.  ?   Pharynx: Oropharynx is clear.  ?Cardiovascular:  ?   Rate and Rhythm: Normal rate and regular rhythm.  ?Pulmonary:  ?   Effort: Pulmonary effort is normal.  ?   Breath sounds: Normal breath sounds. No wheezing, rhonchi or rales.  ?   Comments: CTA ?Musculoskeletal:     ?   General: Normal range of motion.  ?   Cervical back: Normal range of motion and neck supple.  ?Skin: ?   General: Skin is warm and dry.  ?Neurological:  ?   General: No focal deficit present.  ?   Mental Status: He is alert and oriented to person, place, and time. Mental status is at baseline.  ?Psychiatric:     ?   Mood and Affect: Mood normal.     ?   Behavior: Behavior normal.     ?   Thought Content: Thought content normal.     ?   Judgment: Judgment normal.  ?  ? ?Lab Results: ? ?CBC ?   ?Component Value Date/Time  ? WBC 4.3 04/14/2021 1922  ? RBC 4.86 04/14/2021 1922  ? HGB 14.9 04/14/2021 1922  ? HCT 45.5 04/14/2021 1922  ? PLT 135 (L) 04/14/2021 1922  ? MCV 93.6 04/14/2021 1922  ? MCV 101.0 (A) 05/07/2013 1705  ? MCH 30.7 04/14/2021 1922  ? MCHC 32.7 04/14/2021 1922  ? RDW 12.7 04/14/2021 1922  ? LYMPHSABS 1.3 06/18/2019 0432  ? MONOABS 0.5 06/18/2019 0432  ? EOSABS 0.2 06/18/2019 0432  ? BASOSABS 0.0 06/18/2019 0432  ? ? ?BMET ?   ?Component Value Date/Time  ? NA 137 04/14/2021 1922  ? K 3.7  04/14/2021 1922  ? CL 106 04/14/2021 1922  ? CO2 25 04/14/2021 1922  ? GLUCOSE 125 (H) 04/14/2021 1922  ? BUN 16 04/14/2021 1922  ? CREATININE 1.30 (H) 04/14/2021 1922  ? CREATININE 1.18 05/07/2013 1704  ? CALCIUM 8.6 (L) 04/14/2021 1922  ? GFRNONAA >60 04/14/2021 1922  ? GFRAA >60 06/18/2019 0432  ? ? ?BNP ?   ?Component Value Date/Time  ? BNP 90.0 05/20/2018 0517  ? ? ?ProBNP ?No results found for: PROBNP ? ?  Imaging: ?No results found. ? ? ?Assessment & Plan:  ? ?Chronic cough ?- Resolved; Hx pneumonia in 2019. Patient was treated for acute bronchitis with Doxycycline in February 2023 with resolution of symptoms. He no longer has cough and has no associated dyspnea symptoms. PFTs showed moderate restrictive lung disease likely d.t previous pneumonia. CXR on 07/03/21 showed scarring right lower lung and stable right hemidiaphragm elevation. Since he is currently asymptomatic recommend holding off on any further testing/advance imaging. If cough symptoms return would recommend getting HRCT chest to better evaluate scarring.  ? ? ?Glenford BayleyElizabeth W Jachin Coury, NP ?08/21/2021 ? ?

## 2021-08-21 NOTE — Assessment & Plan Note (Addendum)
-   Resolved; Hx pneumonia in 2019. Patient was treated for acute bronchitis with Doxycycline in February 2023 with resolution of symptoms. He no longer has cough and has no associated dyspnea symptoms. PFTs showed moderate restrictive lung disease likely d.t previous pneumonia. CXR on 07/03/21 showed scarring right lower lung and stable right hemidiaphragm elevation. Since he is currently asymptomatic recommend holding off on any further testing/advance imaging. If cough symptoms return would recommend getting HRCT chest to better evaluate scarring.  ?

## 2021-08-21 NOTE — Patient Instructions (Addendum)
PFTs showed restriction, likely related to scarring from previous pneumonia you had in 2019 ? ?Chest xray showed no active cardiopulmonary disease, scarring right lower lung, stable right hemidiaphragm elevated ? ?Since you are not having active symptoms would not recommend any further testing. If cough or shortness of breath return please notify us and we will work up further. ? ?Follow-up: ?6 months or sooner if needed  ?

## 2022-02-13 ENCOUNTER — Other Ambulatory Visit: Payer: Self-pay

## 2022-02-13 DIAGNOSIS — Z125 Encounter for screening for malignant neoplasm of prostate: Secondary | ICD-10-CM

## 2022-02-13 NOTE — Addendum Note (Signed)
Addended by: Demetrius Revel on: 02/13/2022 10:45 AM   Modules accepted: Orders

## 2022-02-18 ENCOUNTER — Other Ambulatory Visit: Payer: Self-pay | Admitting: *Deleted

## 2022-02-18 DIAGNOSIS — Z125 Encounter for screening for malignant neoplasm of prostate: Secondary | ICD-10-CM

## 2022-02-18 NOTE — Progress Notes (Signed)
Patient: Brett [name]           Date of Birth: June 17, 1975           MRN: [id number] Visit Date: 02/18/2022 PCP: Jodi Marble, MD  Prostate Cancer Screening Date of last physical exam:  (Unknown) Date of last rectal exam:  (Unknown) Have you ever had any of the following?: None Have you ever had or been told you have an allergy to latex products?: No Are you currently taking any natural prostate preparations?: No Are you currently experiencing any urinary symptoms?: No  Prostate Exam Exam not completed.  PSA Only completed.  Patient's History Patient Active Problem List   Diagnosis Date Noted   Chronic cough 07/03/2021   History of pneumonia 05/19/2018   Sprain of wrist 08/11/2017   Tremor 05/07/2013   Past Medical History:  Diagnosis Date   Acid reflux    Speech impediment     Family History  Problem Relation Age of Onset   Healthy Mother    Pneumonia Father    Diabetes Maternal Grandmother     Social History   Occupational History   Not on file  Tobacco Use   Smoking status: Former    Packs/day: 0.00    Years: 0.50    Total pack years: 0.00    Types: Cigars, Cigarettes    Quit date: 2018    Years since quitting: 5.7   Smokeless tobacco: Never   Tobacco comments:    smokes 1-2 cigars daily for 0.5 year--07/03/2021  Vaping Use   Vaping Use: Never used  Substance and Sexual Activity   Alcohol use: Yes    Comment: occasionally    Drug use: Not Currently    Types: Marijuana    Comment: quit 2017   Sexual activity: Not on file

## 2022-02-19 LAB — PSA: Prostate Specific Ag, Serum: 0.7 ng/mL (ref 0.0–4.0)

## 2022-03-07 ENCOUNTER — Other Ambulatory Visit: Payer: Self-pay

## 2022-03-07 ENCOUNTER — Emergency Department (HOSPITAL_BASED_OUTPATIENT_CLINIC_OR_DEPARTMENT_OTHER)
Admission: EM | Admit: 2022-03-07 | Discharge: 2022-03-07 | Disposition: A | Discharge status: Home / Self Care | Payer: BC Managed Care – PPO | Attending: Emergency Medicine | Admitting: Emergency Medicine

## 2022-03-07 ENCOUNTER — Emergency Department (HOSPITAL_BASED_OUTPATIENT_CLINIC_OR_DEPARTMENT_OTHER): Payer: BC Managed Care – PPO | Admitting: Radiology

## 2022-03-07 ENCOUNTER — Encounter (HOSPITAL_BASED_OUTPATIENT_CLINIC_OR_DEPARTMENT_OTHER): Payer: Self-pay

## 2022-03-07 DIAGNOSIS — R052 Subacute cough: Secondary | ICD-10-CM | POA: Insufficient documentation

## 2022-03-07 DIAGNOSIS — Z20822 Contact with and (suspected) exposure to covid-19: Secondary | ICD-10-CM | POA: Diagnosis not present

## 2022-03-07 DIAGNOSIS — R0981 Nasal congestion: Secondary | ICD-10-CM | POA: Insufficient documentation

## 2022-03-07 DIAGNOSIS — R059 Cough, unspecified: Secondary | ICD-10-CM | POA: Diagnosis present

## 2022-03-07 LAB — RESP PANEL BY RT-PCR (FLU A&B, COVID) ARPGX2
Influenza A by PCR: NEGATIVE
Influenza B by PCR: NEGATIVE
SARS Coronavirus 2 by RT PCR: NEGATIVE

## 2022-03-07 MED ORDER — CETIRIZINE HCL 10 MG PO TABS: 10.0000 mg | ORAL_TABLET | Freq: Every day | ORAL | 0 refills | Status: DC

## 2022-03-07 MED ORDER — OXYMETAZOLINE HCL 0.05 % NA SOLN: 1.0000 | Freq: Two times a day (BID) | NASAL | 0 refills | Status: DC

## 2022-03-07 MED ORDER — ALBUTEROL SULFATE HFA 108 (90 BASE) MCG/ACT IN AERS: 1.0000 | INHALATION_SPRAY | Freq: Four times a day (QID) | RESPIRATORY_TRACT | 2 refills | Status: DC | PRN

## 2022-03-07 NOTE — ED Triage Notes (Signed)
Patient here POV from Home.  Endorses Cough for approximately 2 Weeks. Somewhat productive Recently.   No Fevers or Sore Throat. Associated with Congestion.   NAD noted during Triage. A&Ox4. GCS 15. Ambulatory.

## 2022-03-07 NOTE — ED Provider Notes (Signed)
[facility] Provider Note   CSN: [id number] Arrival date & time: 03/07/22  1330     History  Chief Complaint  Patient presents with   Cough    Brett [name] is a 46 y.o. male.  46 year old male with a history of seasonal allergies, GERD on Protonix, and prior wheezing on albuterol presents emergency department with 2 weeks of cough productive of yellow sputum.  Also states he has been having some congestion with this.  Denies any shortness of breath or chest pain.  No lower extremity swelling.  Says that he has been trying over-the-counter medication such as Mucinex without significant improvement of his symptoms.  Says that the cough is not worse at night or at any particular time during the day.  Denies any fevers or chills.  Says that his significant other who is a nurse thinks that he may still have asthma.  Patient denies any smoking or vaping use.   Past Medical History:  Diagnosis Date   Acid reflux    Speech impediment       Home Medications Prior to Admission medications   Medication Sig Start Date End Date Taking? Authorizing Provider  oxymetazoline (AFRIN NASAL SPRAY) 0.05 % nasal spray Place 1 spray into both nostrils 2 (two) times daily. 03/07/22  Yes Fransico Meadow, MD  albuterol (VENTOLIN HFA) 108 (90 Base) MCG/ACT inhaler Inhale 1-2 puffs into the lungs every 6 (six) hours as needed for wheezing or shortness of breath. 03/07/22   Fransico Meadow, MD  benzonatate (TESSALON) 200 MG capsule Take 1 capsule (200 mg total) by mouth 3 (three) times daily as needed for cough. 07/03/21 07/03/22  Parrett, Fonnie Mu, NP  cetirizine (ZYRTEC) 10 MG tablet Take 1 tablet (10 mg total) by mouth daily. 03/07/22   Fransico Meadow, MD  ibuprofen (ADVIL,MOTRIN) 800 MG tablet Take 800 mg by mouth every 6 (six) hours as needed.    [provider]  ipratropium-albuterol (DUONEB) 0.5-2.5 (3) MG/3ML SOLN Take 3 mLs by nebulization every 6 (six)  hours. 05/28/18   Epifanio Lesches, MD  pantoprazole (PROTONIX) 40 MG tablet Take 40 mg by mouth every morning. 02/28/21   [provider]  rosuvastatin (CRESTOR) 10 MG tablet SMARTSIG:1 Tablet(s) By Mouth Every Evening 02/28/21   [provider]  UBRELVY 50 MG TABS Take by mouth. 03/14/21   [provider]      Allergies    Penicillins    Review of Systems   Review of Systems  Physical Exam Updated Vital Signs BP (!) 123/96 (BP Location: Right Arm)   Pulse 83   Temp 99.1 F (37.3 C) (Oral)   Resp 16   Ht 6' (1.829 m)   Wt 94.3 kg   SpO2 97%   BMI 28.20 kg/m  Physical Exam Vitals and nursing note reviewed.  Constitutional:      General: He is not in acute distress.    Appearance: He is well-developed.  HENT:     Head: Normocephalic and atraumatic.     Right Ear: External ear normal.     Left Ear: External ear normal.     Nose: Congestion present.  Eyes:     Extraocular Movements: Extraocular movements intact.     Conjunctiva/sclera: Conjunctivae normal.     Pupils: Pupils are equal, round, and reactive to light.  Cardiovascular:     Rate and Rhythm: Normal rate and regular rhythm.     Heart sounds: Normal heart sounds.  Pulmonary:     Effort: Pulmonary effort is normal. No respiratory distress.     Breath sounds: Normal breath sounds. No wheezing.  Abdominal:     Palpations: Abdomen is soft.  Musculoskeletal:        General: No swelling.     Cervical back: Normal range of motion and neck supple.     Right lower leg: No edema.     Left lower leg: No edema.  Skin:    General: Skin is warm and dry.     Capillary Refill: Capillary refill takes less than 2 seconds.  Neurological:     Mental Status: He is alert. Mental status is at baseline.  Psychiatric:        Mood and Affect: Mood normal.        Behavior: Behavior normal.     ED Results / Procedures / Treatments   Labs (all labs ordered are listed, but only abnormal results are  displayed) Labs Reviewed  RESP PANEL BY RT-PCR (FLU A&B, COVID) [id number]    EKG None  Radiology DG Chest 2 View  Result Date: 03/07/2022 CLINICAL DATA:  Cough. EXAM: CHEST - 2 VIEW COMPARISON:  July 03, 2021. FINDINGS: The heart size and mediastinal contours are within normal limits. Both lungs are clear. The visualized skeletal structures are unremarkable. IMPRESSION: No active cardiopulmonary disease. Electronically Signed   By: Lupita Raider M.D.   On: 03/07/2022 13:54    Procedures Procedures   Medications Ordered in ED Medications - No data to display  ED Course/ Medical Decision Making/ A&P                           Medical Decision Making Risk OTC drugs. Prescription drug management.   Brett [name] is a 46 y.o. male with comorbidities that complicate the patient evaluation including GERD, seasonal allergies, and prior history of wheezing who presents with chief complaint of subacute cough productive of yellow sputum and congestion.  This patient presents to the ED for concern of complaints listed in HPI, this involves an extensive number of treatment options, and is a complaint that carries with it a high risk of complications and morbidity.   Initial Ddx:  Asthma, pneumonia, URI, postnasal drip/allergies, GERD  MDM:  Feel the patient likely is having allergies or postnasal drip that is causing his cough given the lack of infectious symptoms and duration of symptoms.  Also considered asthma but patient does not have any wheezing on exam at this point in time.  Will obtain chest x-ray and COVID and flu to evaluate for pneumonia or URI.  Considered GERD but cough is not typically nocturnal and is already on medications for his reflux.  Plan:  COVID and flu Chest x-ray  ED Summary/Re-evaluation:  Above work-up did not reveal evidence of infection or other acute abnormality.  We will have the patient take Zyrtec and Flonase for his congestion in case this is due to  postnasal drip or allergies.  We will also refill his inhaler which he reports that he is out of but feel that asthma is less likely given the lack of wheezing.  While the patient follow-up with his PCP in 2 to 3 days.  Dispo: DC Home. Return precautions discussed including, but not limited to, those listed in the AVS. Allowed pt time to ask questions which were answered fully prior to dc.  Records reviewed Care Everywhere I independently reviewed the following imaging  with scope of interpretation limited to determining acute life threatening conditions related to emergency care: Chest x-ray, which revealed no acute abnormality  I personally reviewed and interpreted cardiac monitoring: normal sinus rhythm  I have reviewed the patients home medications and made adjustments as needed  Final Clinical Impression(s) / ED Diagnoses Final diagnoses:  Subacute cough  Nose congestion    Rx / DC Orders ED Discharge Orders          Ordered    albuterol (VENTOLIN HFA) 108 (90 Base) MCG/ACT inhaler  Every 6 hours PRN        03/07/22 1529    oxymetazoline (AFRIN NASAL SPRAY) 0.05 % nasal spray  2 times daily        03/07/22 1529    cetirizine (ZYRTEC) 10 MG tablet  Daily        03/07/22 1529              Rondel Baton, MD 03/07/22 1534

## 2022-03-07 NOTE — Discharge Instructions (Addendum)
Today you were seen in the emergency department for your cough.    In the emergency department you had a chest x-ray and COVID and flu that did not show evidence of pneumonia or infection.    At home, please take the Zyrtec and Afrin for congestion which may be related to your cough.  We also have refilled your inhaler that you may try to see if this improves your cough.    Check your MyChart online for the results of any tests that had not resulted by the time you left the emergency department.   Follow-up with your primary doctor in 2-3 days regarding your visit.    Return immediately to the emergency department if you experience any of the following: Difficulty breathing, chest pain, or any other concerning symptoms.    Thank you for visiting our Emergency Department. It was a pleasure taking care of you today.

## 2022-03-07 NOTE — ED Notes (Signed)
RT Note: Pt. seen in room, resting comfortable

## 2022-07-03 ENCOUNTER — Encounter: Payer: Self-pay | Admitting: Internal Medicine

## 2022-07-03 ENCOUNTER — Ambulatory Visit: Payer: BC Managed Care – PPO | Admitting: Internal Medicine

## 2022-07-03 ENCOUNTER — Other Ambulatory Visit: Payer: Self-pay

## 2022-07-03 VITALS — BP 120/80 | HR 94 | Ht 72.0 in | Wt 211.0 lb

## 2022-07-03 DIAGNOSIS — H1031 Unspecified acute conjunctivitis, right eye: Secondary | ICD-10-CM | POA: Diagnosis not present

## 2022-07-03 MED ORDER — CIPROFLOXACIN HCL 0.3 % OP SOLN: 2.0000 [drp] | OPHTHALMIC | 0 refills | Status: DC

## 2022-07-03 MED ORDER — CIPROFLOXACIN HCL 0.3 % OP SOLN: 2.0000 [drp] | OPHTHALMIC | Status: DC

## 2022-07-03 NOTE — Progress Notes (Signed)
Established Patient Office Visit  Subjective:  Patient ID: Brett [name], male    DOB: 1975-12-07  Age: 47 y.o. MRN: [id number]  Chief Complaint  Patient presents with   Eye Problem    Eye problem    C/o itching and redness of his right eye x 1 d after similar symptoms in his son who he was in contact with. Used otc Visine without much improvement.     Past Medical History:  Diagnosis Date   Acid reflux    Speech impediment     Social History   Socioeconomic History   Marital status: Married    Spouse name: Not on file   Number of children: Not on file   Years of education: Not on file   Highest education level: Not on file  Occupational History   Not on file  Tobacco Use   Smoking status: Former    Packs/day: 0.00    Years: 0.50    Total pack years: 0.00    Types: Cigars, Cigarettes    Quit date: 2018    Years since quitting: 6.1   Smokeless tobacco: Never   Tobacco comments:    smokes 1-2 cigars daily for 0.5 year--07/03/2021  Vaping Use   Vaping Use: Never used  Substance and Sexual Activity   Alcohol use: Yes    Comment: occasionally    Drug use: Not Currently    Types: Marijuana    Comment: quit 2017   Sexual activity: Not on file  Other Topics Concern   Not on file  Social History Narrative   Not on file   Social Determinants of Health   Financial Resource Strain: Not on file  Food Insecurity: Not on file  Transportation Needs: Not on file  Physical Activity: Not on file  Stress: Not on file  Social Connections: Not on file  Intimate Partner Violence: Not on file    Family History  Problem Relation Age of Onset   Healthy Mother    Pneumonia Father    Diabetes Maternal Grandmother     Allergies  Allergen Reactions   Penicillins Hives    Review of Systems  Constitutional: Negative.   HENT: Negative.    Eyes:  Positive for pain and discharge.  Respiratory: Negative.    Cardiovascular: Negative.   Gastrointestinal: Negative.    Genitourinary: Negative.   Skin: Negative.   Neurological: Negative.   Endo/Heme/Allergies: Negative.        Objective:   BP 120/80   Pulse 94   Ht 6' (1.829 m)   Wt 211 lb (95.7 kg)   SpO2 97%   BMI 28.62 kg/m   Vitals:   07/03/22 1149  BP: 120/80  Pulse: 94  Height: 6' (1.829 m)  Weight: 211 lb (95.7 kg)  SpO2: 97%  BMI (Calculated): 28.61    Physical Exam Vitals reviewed.  Constitutional:      Appearance: Normal appearance.  HENT:     Head: Normocephalic.     Left Ear: There is no impacted cerumen.     Nose: Nose normal.     Mouth/Throat:     Mouth: Mucous membranes are moist.     Pharynx: No posterior oropharyngeal erythema.  Eyes:     Extraocular Movements: Extraocular movements intact.     Conjunctiva/sclera:     Right eye: Right conjunctiva is injected.     Pupils: Pupils are equal, round, and reactive to light.  Cardiovascular:     Rate and Rhythm: Regular  rhythm.     Chest Wall: PMI is not displaced.     Pulses: Normal pulses.     Heart sounds: Normal heart sounds. No murmur heard. Pulmonary:     Effort: Pulmonary effort is normal.     Breath sounds: Normal air entry. No rhonchi or rales.  Abdominal:     General: Abdomen is flat. Bowel sounds are normal. There is no distension.     Palpations: Abdomen is soft. There is no hepatomegaly, splenomegaly or mass.     Tenderness: There is no abdominal tenderness.  Musculoskeletal:        General: Normal range of motion.     Cervical back: Normal range of motion and neck supple.     Right lower leg: No edema.     Left lower leg: No edema.  Skin:    General: Skin is warm and dry.  Neurological:     General: No focal deficit present.     Mental Status: He is alert and oriented to person, place, and time.     Cranial Nerves: No cranial nerve deficit.     Motor: No weakness.  Psychiatric:        Mood and Affect: Mood normal.        Behavior: Behavior normal.      No results found for any  visits on 07/03/22.  No results found for this or any previous visit (from the past 2160 hour(Ettore Trebilcock)).    Assessment & Plan:   Problem List Items Addressed This Visit   None Visit Diagnoses     Acute conjunctivitis of right eye, unspecified acute conjunctivitis type    -  Primary   Relevant Medications   ciprofloxacin (CILOXAN) 0.3 % ophthalmic solution 2 drop (Start on 07/03/2022  2:00 PM)     1. Acute conjunctivitis of right eye, unspecified acute conjunctivitis type - ciprofloxacin (CILOXAN) 0.3 % ophthalmic solution 2 drop    Return in about 4 weeks (around 07/31/2022).   Total time spent: 30 minutes  Volanda Napoleon, MD  07/03/2022

## 2022-07-17 ENCOUNTER — Other Ambulatory Visit: Payer: Self-pay | Admitting: Internal Medicine

## 2022-07-19 ENCOUNTER — Telehealth: Payer: Self-pay

## 2022-07-19 NOTE — Telephone Encounter (Signed)
Patient is requesting rx refill on the tessalon perles

## 2022-09-05 ENCOUNTER — Other Ambulatory Visit: Payer: BC Managed Care – PPO

## 2022-09-05 ENCOUNTER — Other Ambulatory Visit: Payer: Self-pay | Admitting: Internal Medicine

## 2022-09-06 ENCOUNTER — Ambulatory Visit: Payer: BC Managed Care – PPO | Admitting: Internal Medicine

## 2022-09-07 LAB — LIPID PANEL W/O CHOL/HDL RATIO
Cholesterol, Total: 224 mg/dL — ABNORMAL HIGH (ref 100–199)
HDL: 33 mg/dL — ABNORMAL LOW (ref >39)
LDL Chol Calc (NIH): 156 mg/dL — ABNORMAL HIGH (ref 0–99)
Triglycerides: 189 mg/dL — ABNORMAL HIGH (ref 0–149)
VLDL Cholesterol Cal: 35 mg/dL (ref 5–40)

## 2022-09-07 LAB — HEPATIC FUNCTION PANEL
ALT: 29 IU/L (ref 0–44)
AST: 17 IU/L (ref 0–40)
Albumin: 4.5 g/dL (ref 4.1–5.1)
Alkaline Phosphatase: 72 IU/L (ref 44–121)
Bilirubin Total: 0.5 mg/dL (ref 0.0–1.2)
Bilirubin, Direct: 0.12 mg/dL (ref 0.00–0.40)
Total Protein: 6.6 g/dL (ref 6.0–8.5)

## 2022-09-07 LAB — HGB A1C W/O EAG: Hgb A1c MFr Bld: 6.6 % — ABNORMAL HIGH (ref 4.8–5.6)

## 2022-09-07 LAB — CK: Total CK: 160 U/L (ref 49–439)

## 2022-09-16 MED ORDER — METFORMIN HCL 500 MG PO TABS: 500.0000 mg | ORAL_TABLET | Freq: Two times a day (BID) | ORAL | 0 refills | Status: DC

## 2022-09-16 NOTE — Addendum Note (Signed)
Addended by: Feliberto Harts on: 09/16/2022 03:22 PM   Modules accepted: Orders

## 2022-09-17 NOTE — Progress Notes (Signed)
Patient notified

## 2022-09-20 ENCOUNTER — Telehealth: Payer: Self-pay | Admitting: Internal Medicine

## 2022-09-20 ENCOUNTER — Other Ambulatory Visit: Payer: Self-pay | Admitting: Internal Medicine

## 2022-09-20 NOTE — Telephone Encounter (Signed)
Patient sent MyChart message requesting refill on his Ozempic. I do not see Ozempic on his med list. Just FYI.

## 2022-09-24 ENCOUNTER — Telehealth: Payer: Self-pay | Admitting: Internal Medicine

## 2022-09-24 NOTE — Telephone Encounter (Signed)
Patient left VM that metformin is causing serious side effects such as upset stomach, nausea, and diarrhea. Please advise on what to do.

## 2022-09-25 MED ORDER — METFORMIN HCL ER 500 MG PO TB24: 500.0000 mg | ORAL_TABLET | Freq: Two times a day (BID) | ORAL | 2 refills | Status: DC

## 2022-10-04 ENCOUNTER — Encounter: Payer: Self-pay | Admitting: Internal Medicine

## 2022-10-04 ENCOUNTER — Ambulatory Visit: Payer: BC Managed Care – PPO | Admitting: Internal Medicine

## 2022-10-04 VITALS — BP 120/84 | HR 60 | Ht 72.0 in | Wt 210.4 lb

## 2022-10-04 DIAGNOSIS — E119 Type 2 diabetes mellitus without complications: Secondary | ICD-10-CM

## 2022-10-04 LAB — POCT CBG (FASTING - GLUCOSE)-MANUAL ENTRY: Glucose Fasting, POC: 97 mg/dL (ref 70–99)

## 2022-10-04 MED ORDER — RYBELSUS 3 MG PO TABS: 3.0000 mg | ORAL_TABLET | Freq: Every day | ORAL | 0 refills | Status: AC

## 2022-10-04 MED ORDER — RYBELSUS 7 MG PO TABS: 7.0000 mg | ORAL_TABLET | Freq: Every day | ORAL | 1 refills | Status: DC

## 2022-10-04 NOTE — Progress Notes (Signed)
Established Patient Office Visit  Subjective:  Patient ID: Brett [name], male    DOB: 08/01/1975  Age: 47 y.o. MRN: [id number]  Chief Complaint  Patient presents with   Follow-up    3 month follow up    No new complaints, here for lab review and medication refills. Unable to tolerate Metformin IR and didn't fill prescription for extended release.     No other concerns at this time.   Past Medical History:  Diagnosis Date   Acid reflux    Speech impediment     Past Surgical History:  Procedure Laterality Date   TONSILLECTOMY      Social History   Socioeconomic History   Marital status: Married    Spouse name: Not on file   Number of children: Not on file   Years of education: Not on file   Highest education level: Not on file  Occupational History   Not on file  Tobacco Use   Smoking status: Former    Packs/day: 0.00    Years: 0.50    Additional pack years: 0.00    Total pack years: 0.00    Types: Cigars, Cigarettes    Quit date: 2018    Years since quitting: 6.3   Smokeless tobacco: Never   Tobacco comments:    smokes 1-2 cigars daily for 0.5 year--07/03/2021  Vaping Use   Vaping Use: Never used  Substance and Sexual Activity   Alcohol use: Yes    Comment: occasionally    Drug use: Not Currently    Types: Marijuana    Comment: quit 2017   Sexual activity: Not on file  Other Topics Concern   Not on file  Social History Narrative   Not on file   Social Determinants of Health   Financial Resource Strain: Not on file  Food Insecurity: Not on file  Transportation Needs: Not on file  Physical Activity: Not on file  Stress: Not on file  Social Connections: Not on file  Intimate Partner Violence: Not on file    Family History  Problem Relation Age of Onset   Healthy Mother    Pneumonia Father    Diabetes Maternal Grandmother     Allergies  Allergen Reactions   Penicillins Hives    Review of Systems  Constitutional: Negative.         Gained 5 lbs  HENT: Negative.    Eyes: Negative.   Respiratory: Negative.    Cardiovascular: Negative.   Gastrointestinal: Negative.   Genitourinary: Negative.   Skin: Negative.   Neurological: Negative.   Endo/Heme/Allergies: Negative.        Objective:   BP 120/84   Pulse 60   Ht 6' (1.829 m)   Wt 210 lb 6.4 oz (95.4 kg)   SpO2 100%   BMI 28.54 kg/m   Vitals:   10/04/22 1514  BP: 120/84  Pulse: 60  Height: 6' (1.829 m)  Weight: 210 lb 6.4 oz (95.4 kg)  SpO2: 100%  BMI (Calculated): 28.53    Physical Exam Vitals reviewed.  Constitutional:      Appearance: Normal appearance.  HENT:     Head: Normocephalic.     Left Ear: There is no impacted cerumen.     Nose: Nose normal.     Mouth/Throat:     Mouth: Mucous membranes are moist.     Pharynx: No posterior oropharyngeal erythema.  Eyes:     Extraocular Movements: Extraocular movements intact.     Pupils:  Pupils are equal, round, and reactive to light.  Cardiovascular:     Rate and Rhythm: Regular rhythm.     Chest Wall: PMI is not displaced.     Pulses: Normal pulses.     Heart sounds: Normal heart sounds. No murmur heard. Pulmonary:     Effort: Pulmonary effort is normal.     Breath sounds: Normal air entry. No rhonchi or rales.  Abdominal:     General: Abdomen is flat. Bowel sounds are normal. There is no distension.     Palpations: Abdomen is soft. There is no hepatomegaly, splenomegaly or mass.     Tenderness: There is no abdominal tenderness.  Musculoskeletal:        General: Normal range of motion.     Cervical back: Normal range of motion and neck supple.     Right lower leg: No edema.     Left lower leg: No edema.  Skin:    General: Skin is warm and dry.  Neurological:     General: No focal deficit present.     Mental Status: He is alert and oriented to person, place, and time.     Cranial Nerves: No cranial nerve deficit.     Motor: No weakness.  Psychiatric:        Mood and Affect: Mood  normal.        Behavior: Behavior normal.      Results for orders placed or performed in visit on 10/04/22  POCT CBG (Fasting - Glucose)  Result Value Ref Range   Glucose Fasting, POC 97 70 - 99 mg/dL    Recent Results (from the past 2160 hour(Kaeson Kleinert))  Lipid Panel w/o Chol/HDL Ratio     Status: Abnormal   Collection Time: 09/05/22  8:27 AM  Result Value Ref Range   Cholesterol, Total 224 (H) 100 - 199 mg/dL   Triglycerides 119 (H) 0 - 149 mg/dL   HDL 33 (L) >14 mg/dL   VLDL Cholesterol Cal 35 5 - 40 mg/dL   LDL Chol Calc (NIH) 782 (H) 0 - 99 mg/dL  Hepatic function panel     Status: None   Collection Time: 09/05/22  8:27 AM  Result Value Ref Range   Total Protein 6.6 6.0 - 8.5 g/dL   Albumin 4.5 4.1 - 5.1 g/dL   Bilirubin Total 0.5 0.0 - 1.2 mg/dL   Bilirubin, Direct 9.56 0.00 - 0.40 mg/dL   Alkaline Phosphatase 72 44 - 121 IU/L   AST 17 0 - 40 IU/L   ALT 29 0 - 44 IU/L  Hgb A1c w/o eAG     Status: Abnormal   Collection Time: 09/05/22  8:27 AM  Result Value Ref Range   Hgb A1c MFr Bld 6.6 (H) 4.8 - 5.6 %    Comment:          Prediabetes: 5.7 - 6.4          Diabetes: >6.4          Glycemic control for adults with diabetes: <7.0   CK     Status: None   Collection Time: 09/05/22  8:27 AM  Result Value Ref Range   Total CK 160 49 - 439 U/L  POCT CBG (Fasting - Glucose)     Status: None   Collection Time: 10/04/22  3:52 PM  Result Value Ref Range   Glucose Fasting, POC 97 70 - 99 mg/dL      Assessment & Plan:   Problem List Items  Addressed This Visit   None Visit Diagnoses     Controlled type 2 diabetes mellitus without complication, without long-term current use of insulin (HCC)    -  Primary   Relevant Medications   Semaglutide (RYBELSUS) 3 MG TABS   Semaglutide (RYBELSUS) 7 MG TABS   Other Relevant Orders   Hemoglobin A1c   Lipid panel   POCT CBG (Fasting - Glucose) (Completed)       Return in about 3 months (around 01/04/2023) for fu with labs prior.    Total time spent: 20 minutes  Luna Fuse, MD  10/04/2022   This document may have been prepared by [facility] Voice Recognition software and as such may include unintentional dictation errors.

## 2022-10-11 ENCOUNTER — Telehealth: Payer: Self-pay

## 2022-10-11 ENCOUNTER — Other Ambulatory Visit: Payer: Self-pay | Admitting: Internal Medicine

## 2022-10-11 DIAGNOSIS — E119 Type 2 diabetes mellitus without complications: Secondary | ICD-10-CM

## 2022-10-11 MED ORDER — ONETOUCH ULTRA TEST VI STRP
ORAL_STRIP | 12 refills | Status: DC
Start: 2022-10-11 — End: 2022-10-18

## 2022-10-11 MED ORDER — ONETOUCH ULTRASOFT 2 LANCETS MISC: 1.0000 [IU] | Freq: Two times a day (BID) | 2 refills | Status: DC

## 2022-10-11 MED ORDER — ONETOUCH ULTRA 2 W/DEVICE KIT
1.0000 | PACK | Freq: Every day | 0 refills | Status: AC
Start: 2022-10-11 — End: 2022-11-10

## 2022-10-11 NOTE — Telephone Encounter (Signed)
Patient called wanting to know if you would send him in a glucose monitor prescription. Please advise.

## 2022-10-11 NOTE — Progress Notes (Signed)
dd

## 2022-10-18 ENCOUNTER — Other Ambulatory Visit: Payer: Self-pay

## 2022-10-18 DIAGNOSIS — E119 Type 2 diabetes mellitus without complications: Secondary | ICD-10-CM

## 2022-10-18 MED ORDER — ONETOUCH ULTRA TEST VI STRP
ORAL_STRIP | 12 refills | Status: AC
Start: 2022-10-18 — End: ?

## 2022-10-21 ENCOUNTER — Other Ambulatory Visit: Payer: Self-pay

## 2022-10-21 MED ORDER — PANTOPRAZOLE SODIUM 40 MG PO TBEC: 40.0000 mg | DELAYED_RELEASE_TABLET | Freq: Every morning | ORAL | 1 refills | Status: DC

## 2022-12-22 ENCOUNTER — Other Ambulatory Visit: Payer: Self-pay | Admitting: Internal Medicine

## 2022-12-22 DIAGNOSIS — E119 Type 2 diabetes mellitus without complications: Secondary | ICD-10-CM

## 2023-01-03 ENCOUNTER — Other Ambulatory Visit: Payer: Self-pay

## 2023-01-03 ENCOUNTER — Ambulatory Visit: Payer: BC Managed Care – PPO | Admitting: Internal Medicine

## 2023-01-03 ENCOUNTER — Other Ambulatory Visit: Payer: BC Managed Care – PPO

## 2023-01-03 DIAGNOSIS — E119 Type 2 diabetes mellitus without complications: Secondary | ICD-10-CM

## 2023-01-04 LAB — LIPID PANEL
Chol/HDL Ratio: 4.4 ratio (ref 0.0–5.0)
Cholesterol, Total: 191 mg/dL (ref 100–199)
HDL: 43 mg/dL (ref >39)
LDL Chol Calc (NIH): 130 mg/dL — ABNORMAL HIGH (ref 0–99)
Triglycerides: 100 mg/dL (ref 0–149)
VLDL Cholesterol Cal: 18 mg/dL (ref 5–40)

## 2023-01-04 LAB — HEMOGLOBIN A1C
Est. average glucose Bld gHb Est-mCnc: 126 mg/dL
Hgb A1c MFr Bld: 6 % — ABNORMAL HIGH (ref 4.8–5.6)

## 2023-01-07 ENCOUNTER — Ambulatory Visit: Payer: BC Managed Care – PPO | Admitting: Internal Medicine

## 2023-01-07 VITALS — BP 130/80 | HR 82 | Ht 72.0 in | Wt 202.4 lb

## 2023-01-07 DIAGNOSIS — E119 Type 2 diabetes mellitus without complications: Secondary | ICD-10-CM

## 2023-01-07 DIAGNOSIS — K219 Gastro-esophageal reflux disease without esophagitis: Secondary | ICD-10-CM | POA: Diagnosis not present

## 2023-01-07 DIAGNOSIS — E782 Mixed hyperlipidemia: Secondary | ICD-10-CM | POA: Diagnosis not present

## 2023-01-07 LAB — POCT CBG (FASTING - GLUCOSE)-MANUAL ENTRY: Glucose Fasting, POC: 105 mg/dL — AB (ref 70–99)

## 2023-01-07 MED ORDER — RYBELSUS 7 MG PO TABS
7.0000 mg | ORAL_TABLET | Freq: Every day | ORAL | 0 refills | Status: AC
Start: 2023-01-07 — End: 2023-04-07

## 2023-01-07 MED ORDER — ROSUVASTATIN CALCIUM 20 MG PO TABS
20.0000 mg | ORAL_TABLET | Freq: Every day | ORAL | 11 refills | Status: DC
Start: 2023-01-07 — End: 2023-04-14

## 2023-01-07 NOTE — Progress Notes (Signed)
Established Patient Office Visit  Subjective:  Patient ID: Brett [name], male    DOB: 02-Jun-1975  Age: 47 y.o. MRN: [id number]  Chief Complaint  Patient presents with   Follow-up    3 MO F/U    No new complaints, here for lab review and medication refills. Labs reviewed and notable for well controlled diabetes, A1c at target, lipids not at target with unremarkable cmp. Denies any hypoglycemic episodes and home bg readings have been at target.   No other concerns at this time.   Past Medical History:  Diagnosis Date   Acid reflux    Speech impediment     Past Surgical History:  Procedure Laterality Date   TONSILLECTOMY      Social History   Socioeconomic History   Marital status: Married    Spouse name: Not on file   Number of children: Not on file   Years of education: Not on file   Highest education level: Not on file  Occupational History   Not on file  Tobacco Use   Smoking status: Former    Current packs/day: 0.00    Types: Cigars, Cigarettes    Quit date: 11/2015    Years since quitting: 7.1   Smokeless tobacco: Never   Tobacco comments:    smokes 1-2 cigars daily for 0.5 year--07/03/2021  Vaping Use   Vaping status: Never Used  Substance and Sexual Activity   Alcohol use: Yes    Comment: occasionally    Drug use: Not Currently    Types: Marijuana    Comment: quit 2017   Sexual activity: Not on file  Other Topics Concern   Not on file  Social History Narrative   Not on file   Social Determinants of Health   Financial Resource Strain: Not on file  Food Insecurity: Not on file  Transportation Needs: Not on file  Physical Activity: Not on file  Stress: Not on file  Social Connections: Not on file  Intimate Partner Violence: Not on file    Family History  Problem Relation Age of Onset   Healthy Mother    Pneumonia Father    Diabetes Maternal Grandmother     Allergies  Allergen Reactions   Penicillins Hives    Review of Systems   Constitutional:  Positive for weight loss (8 lbs).  HENT: Negative.    Eyes: Negative.   Respiratory: Negative.    Cardiovascular: Negative.   Gastrointestinal: Negative.   Genitourinary: Negative.   Skin: Negative.   Neurological: Negative.   Endo/Heme/Allergies: Negative.        Objective:   BP 130/80   Pulse 82   Ht 6' (1.829 m)   Wt 202 lb 6.4 oz (91.8 kg)   SpO2 97%   BMI 27.45 kg/m   Vitals:   01/07/23 1417  BP: 130/80  Pulse: 82  Height: 6' (1.829 m)  Weight: 202 lb 6.4 oz (91.8 kg)  SpO2: 97%  BMI (Calculated): 27.44    Physical Exam Vitals reviewed.  Constitutional:      Appearance: Normal appearance.  HENT:     Head: Normocephalic.     Left Ear: There is no impacted cerumen.     Nose: Nose normal.     Mouth/Throat:     Mouth: Mucous membranes are moist.     Pharynx: No posterior oropharyngeal erythema.  Eyes:     Extraocular Movements: Extraocular movements intact.     Pupils: Pupils are equal, round, and reactive to  light.  Cardiovascular:     Rate and Rhythm: Regular rhythm.     Chest Wall: PMI is not displaced.     Pulses: Normal pulses.     Heart sounds: Normal heart sounds. No murmur heard. Pulmonary:     Effort: Pulmonary effort is normal.     Breath sounds: Normal air entry. No rhonchi or rales.  Abdominal:     General: Abdomen is flat. Bowel sounds are normal. There is no distension.     Palpations: Abdomen is soft. There is no hepatomegaly, splenomegaly or mass.     Tenderness: There is no abdominal tenderness.  Musculoskeletal:        General: Normal range of motion.     Cervical back: Normal range of motion and neck supple.     Right lower leg: No edema.     Left lower leg: No edema.  Skin:    General: Skin is warm and dry.  Neurological:     General: No focal deficit present.     Mental Status: He is alert and oriented to person, place, and time.     Cranial Nerves: No cranial nerve deficit.     Motor: No weakness.   Psychiatric:        Mood and Affect: Mood normal.        Behavior: Behavior normal.      Results for orders placed or performed in visit on 01/07/23  POCT CBG (Fasting - Glucose)  Result Value Ref Range   Glucose Fasting, POC 105 (A) 70 - 99 mg/dL    Recent Results (from the past 2160 hour(Laverda Stribling))  Lipid panel     Status: Abnormal   Collection Time: 01/03/23  8:50 AM  Result Value Ref Range   Cholesterol, Total 191 100 - 199 mg/dL   Triglycerides 161 0 - 149 mg/dL   HDL 43 >09 mg/dL   VLDL Cholesterol Cal 18 5 - 40 mg/dL   LDL Chol Calc (NIH) 604 (H) 0 - 99 mg/dL   Chol/HDL Ratio 4.4 0.0 - 5.0 ratio    Comment:                                   T. Chol/HDL Ratio                                             Men  Women                               1/2 Avg.Risk  3.4    3.3                                   Avg.Risk  5.0    4.4                                2X Avg.Risk  9.6    7.1                                3X Avg.Risk 23.4   11.0  Hemoglobin A1c     Status: Abnormal   Collection Time: 01/03/23  8:50 AM  Result Value Ref Range   Hgb A1c MFr Bld 6.0 (H) 4.8 - 5.6 %    Comment:          Prediabetes: 5.7 - 6.4          Diabetes: >6.4          Glycemic control for adults with diabetes: <7.0    Est. average glucose Bld gHb Est-mCnc 126 mg/dL  POCT CBG (Fasting - Glucose)     Status: Abnormal   Collection Time: 01/07/23  2:21 PM  Result Value Ref Range   Glucose Fasting, POC 105 (A) 70 - 99 mg/dL      Assessment & Plan:   As per problem list. The current medical regimen is effective;  continue present plan and medications.   Problem List Items Addressed This Visit   None Visit Diagnoses     Controlled type 2 diabetes mellitus without complication, without long-term current use of insulin (HCC)    -  Primary   Relevant Medications   Semaglutide (RYBELSUS) 7 MG TABS   rosuvastatin (CRESTOR) 20 MG tablet   Other Relevant Orders   POCT CBG (Fasting - Glucose)  (Completed)   Hemoglobin A1c   Lipid panel   Mixed hyperlipidemia       Relevant Medications   rosuvastatin (CRESTOR) 20 MG tablet   Other Relevant Orders   Lipid panel   Comprehensive metabolic panel   Gastroesophageal reflux disease without esophagitis       Relevant Orders   CBC With Diff/Platelet       Return in about 3 months (around 04/09/2023) for cpe with labs prior.   Total time spent: 20 minutes  Luna Fuse, MD  01/07/2023   This document may have been prepared by [facility] Voice Recognition software and as such may include unintentional dictation errors.

## 2023-04-11 ENCOUNTER — Other Ambulatory Visit: Payer: BC Managed Care – PPO

## 2023-04-11 DIAGNOSIS — E119 Type 2 diabetes mellitus without complications: Secondary | ICD-10-CM

## 2023-04-11 DIAGNOSIS — E782 Mixed hyperlipidemia: Secondary | ICD-10-CM

## 2023-04-11 DIAGNOSIS — K219 Gastro-esophageal reflux disease without esophagitis: Secondary | ICD-10-CM

## 2023-04-12 LAB — LIPID PANEL
Chol/HDL Ratio: 5 ratio (ref 0.0–5.0)
Cholesterol, Total: 195 mg/dL (ref 100–199)
HDL: 39 mg/dL — ABNORMAL LOW (ref >39)
LDL Chol Calc (NIH): 135 mg/dL — ABNORMAL HIGH (ref 0–99)
Triglycerides: 113 mg/dL (ref 0–149)
VLDL Cholesterol Cal: 21 mg/dL (ref 5–40)

## 2023-04-12 LAB — CBC WITH DIFF/PLATELET
Basophils Absolute: 0 10*3/uL (ref 0.0–0.2)
Basos: 1 %
EOS (ABSOLUTE): 0.1 10*3/uL (ref 0.0–0.4)
Eos: 3 %
Hematocrit: 47 % (ref 37.5–51.0)
Hemoglobin: 15.3 g/dL (ref 13.0–17.7)
Immature Grans (Abs): 0 10*3/uL (ref 0.0–0.1)
Immature Granulocytes: 0 %
Lymphocytes Absolute: 1.3 10*3/uL (ref 0.7–3.1)
Lymphs: 28 %
MCH: 30.2 pg (ref 26.6–33.0)
MCHC: 32.6 g/dL (ref 31.5–35.7)
MCV: 93 fL (ref 79–97)
Monocytes Absolute: 0.5 10*3/uL (ref 0.1–0.9)
Monocytes: 11 %
Neutrophils Absolute: 2.7 10*3/uL (ref 1.4–7.0)
Neutrophils: 57 %
Platelets: 164 10*3/uL (ref 150–450)
RBC: 5.06 x10E6/uL (ref 4.14–5.80)
RDW: 12.3 % (ref 11.6–15.4)
WBC: 4.7 10*3/uL (ref 3.4–10.8)

## 2023-04-12 LAB — HEMOGLOBIN A1C
Est. average glucose Bld gHb Est-mCnc: 126 mg/dL
Hgb A1c MFr Bld: 6 % — ABNORMAL HIGH (ref 4.8–5.6)

## 2023-04-12 LAB — COMPREHENSIVE METABOLIC PANEL
ALT: 22 [IU]/L (ref 0–44)
AST: 15 [IU]/L (ref 0–40)
Albumin: 4.6 g/dL (ref 4.1–5.1)
Alkaline Phosphatase: 67 [IU]/L (ref 44–121)
BUN/Creatinine Ratio: 16 (ref 9–20)
BUN: 19 mg/dL (ref 6–24)
Bilirubin Total: 0.6 mg/dL (ref 0.0–1.2)
CO2: 24 mmol/L (ref 20–29)
Calcium: 9.2 mg/dL (ref 8.7–10.2)
Chloride: 104 mmol/L (ref 96–106)
Creatinine, Ser: 1.19 mg/dL (ref 0.76–1.27)
Globulin, Total: 2 g/dL (ref 1.5–4.5)
Glucose: 94 mg/dL (ref 70–99)
Potassium: 4.3 mmol/L (ref 3.5–5.2)
Sodium: 143 mmol/L (ref 134–144)
Total Protein: 6.6 g/dL (ref 6.0–8.5)
eGFR: 76 mL/min/{1.73_m2} (ref >59)

## 2023-04-14 ENCOUNTER — Encounter: Payer: Self-pay | Admitting: Internal Medicine

## 2023-04-14 ENCOUNTER — Ambulatory Visit (INDEPENDENT_AMBULATORY_CARE_PROVIDER_SITE_OTHER): Payer: BC Managed Care – PPO | Admitting: Internal Medicine

## 2023-04-14 VITALS — BP 118/82 | HR 79 | Ht 72.0 in | Wt 202.6 lb

## 2023-04-14 DIAGNOSIS — E782 Mixed hyperlipidemia: Secondary | ICD-10-CM

## 2023-04-14 DIAGNOSIS — Z0130 Encounter for examination of blood pressure without abnormal findings: Secondary | ICD-10-CM

## 2023-04-14 DIAGNOSIS — K219 Gastro-esophageal reflux disease without esophagitis: Secondary | ICD-10-CM

## 2023-04-14 DIAGNOSIS — Z0000 Encounter for general adult medical examination without abnormal findings: Secondary | ICD-10-CM | POA: Diagnosis not present

## 2023-04-14 DIAGNOSIS — Z1331 Encounter for screening for depression: Secondary | ICD-10-CM

## 2023-04-14 DIAGNOSIS — E119 Type 2 diabetes mellitus without complications: Secondary | ICD-10-CM | POA: Diagnosis not present

## 2023-04-14 DIAGNOSIS — K4090 Unilateral inguinal hernia, without obstruction or gangrene, not specified as recurrent: Secondary | ICD-10-CM

## 2023-04-14 DIAGNOSIS — K4020 Bilateral inguinal hernia, without obstruction or gangrene, not specified as recurrent: Secondary | ICD-10-CM | POA: Insufficient documentation

## 2023-04-14 LAB — POCT CBG (FASTING - GLUCOSE)-MANUAL ENTRY: Glucose Fasting, POC: 89 mg/dL (ref 70–99)

## 2023-04-14 MED ORDER — ROSUVASTATIN CALCIUM 40 MG PO TABS: 40.0000 mg | ORAL_TABLET | Freq: Every day | ORAL | 1 refills | Status: DC

## 2023-04-14 MED ORDER — PANTOPRAZOLE SODIUM 40 MG PO TBEC: 40.0000 mg | DELAYED_RELEASE_TABLET | Freq: Every morning | ORAL | 1 refills | Status: DC

## 2023-04-14 NOTE — Progress Notes (Signed)
Established Patient Office Visit  Subjective:  Patient ID: Brett [name], male    DOB: 18-Nov-1975  Age: 47 y.o. MRN: [id number]  Chief Complaint  Patient presents with   Annual Exam    CPE, discuss lab results.    C/o right groin swelling which is painless x 1 mo. Also here for CPE and lab review. Labs reviewed and notable for well controlled/uncontrolled diabetes, A1c at target, but lipids not at target with unremarkable cmp. Denies any hypoglycemic episodes and home bg readings have been at target.   No other concerns at this time.   Past Medical History:  Diagnosis Date   Acid reflux    Speech impediment     Past Surgical History:  Procedure Laterality Date   TONSILLECTOMY      Social History   Socioeconomic History   Marital status: Married    Spouse name: Not on file   Number of children: Not on file   Years of education: Not on file   Highest education level: Not on file  Occupational History   Not on file  Tobacco Use   Smoking status: Former    Current packs/day: 0.00    Types: Cigars, Cigarettes    Quit date: 11/2015    Years since quitting: 7.4   Smokeless tobacco: Never   Tobacco comments:    smokes 1-2 cigars daily for 0.5 year--07/03/2021  Vaping Use   Vaping status: Never Used  Substance and Sexual Activity   Alcohol use: Yes    Comment: occasionally    Drug use: Not Currently    Types: Marijuana    Comment: quit 2017   Sexual activity: Not on file  Other Topics Concern   Not on file  Social History Narrative   Not on file   Social Determinants of Health   Financial Resource Strain: Not on file  Food Insecurity: Not on file  Transportation Needs: Not on file  Physical Activity: Not on file  Stress: Not on file  Social Connections: Not on file  Intimate Partner Violence: Not on file    Family History  Problem Relation Age of Onset   Healthy Mother    Pneumonia Father    Diabetes Maternal Grandmother     Allergies  Allergen  Reactions   Penicillins Hives    Outpatient Medications Prior to Visit  Medication Sig   albuterol (VENTOLIN HFA) 108 (90 Base) MCG/ACT inhaler Inhale 1-2 puffs into the lungs every 6 (six) hours as needed for wheezing or shortness of breath.   cetirizine (ZYRTEC) 10 MG tablet Take 1 tablet (10 mg total) by mouth daily.   glucose blood (ONETOUCH ULTRA TEST) test strip Use with device to check sugars 2-3 times daily   ibuprofen (ADVIL,MOTRIN) 800 MG tablet Take 800 mg by mouth every 6 (six) hours as needed.   ipratropium-albuterol (DUONEB) 0.5-2.5 (3) MG/3ML SOLN Take 3 mLs by nebulization every 6 (six) hours.   OneTouch UltraSoft 2 Lancets MISC 1 Units by Does not apply route 2 (two) times daily.   RYBELSUS 7 MG TABS Take 1 tablet by mouth daily.   UBRELVY 50 MG TABS Take by mouth.   [DISCONTINUED] pantoprazole (PROTONIX) 40 MG tablet Take 1 tablet (40 mg total) by mouth every morning.   [DISCONTINUED] rosuvastatin (CRESTOR) 20 MG tablet Take 1 tablet (20 mg total) by mouth daily.   [DISCONTINUED] benzonatate (TESSALON) 200 MG capsule TAKE ONE CAPSULE BY MOUTH THREE TIMES DAILY AS NEEDED (Patient not taking: Reported  on 10/04/2022)   [DISCONTINUED] ciprofloxacin (CILOXAN) 0.3 % ophthalmic solution Place 2 drops into the right eye every 4 (four) hours while awake. Administer 1 drop, every 2 hours, while awake, for 2 days. Then 1 drop, every 4 hours, while awake, for the next 5 days. (Patient not taking: Reported on 10/04/2022)   [DISCONTINUED] oxymetazoline (AFRIN NASAL SPRAY) 0.05 % nasal spray Place 1 spray into both nostrils 2 (two) times daily. (Patient not taking: Reported on 10/04/2022)   No facility-administered medications prior to visit.    Review of Systems  Constitutional:  Positive for weight loss (8 lbs).  HENT: Negative.    Eyes: Negative.   Respiratory: Negative.    Cardiovascular: Negative.   Gastrointestinal: Negative.   Genitourinary: Negative.   Musculoskeletal:   Positive for joint pain (right knee).  Skin: Negative.   Neurological: Negative.   Endo/Heme/Allergies: Negative.   Psychiatric/Behavioral: Negative.         Objective:   BP 118/82   Pulse 79   Ht 6' (1.829 m)   Wt 202 lb 9.6 oz (91.9 kg)   SpO2 97%   BMI 27.48 kg/m   Vitals:   04/14/23 1307  BP: 118/82  Pulse: 79  Height: 6' (1.829 m)  Weight: 202 lb 9.6 oz (91.9 kg)  SpO2: 97%  BMI (Calculated): 27.47    Physical Exam Vitals reviewed.  Constitutional:      General: He is not in acute distress.    Appearance: Normal appearance. He is obese.  HENT:     Head: Normocephalic.     Left Ear: There is no impacted cerumen.     Nose: Nose normal.     Mouth/Throat:     Mouth: Mucous membranes are moist.     Pharynx: No posterior oropharyngeal erythema.  Eyes:     Extraocular Movements: Extraocular movements intact.     Pupils: Pupils are equal, round, and reactive to light.  Cardiovascular:     Rate and Rhythm: Regular rhythm.     Chest Wall: PMI is not displaced.     Pulses: Normal pulses.     Heart sounds: Normal heart sounds. No murmur heard. Pulmonary:     Effort: Pulmonary effort is normal.     Breath sounds: Normal air entry. No rhonchi or rales.  Abdominal:     General: Abdomen is flat. Bowel sounds are normal. There is no distension.     Palpations: Abdomen is soft. There is no hepatomegaly, splenomegaly or mass.     Tenderness: There is no abdominal tenderness.     Hernia: A hernia is present. Hernia is present in the right inguinal area (reducible).  Musculoskeletal:        General: Normal range of motion.     Cervical back: Normal range of motion and neck supple.     Right lower leg: No edema.     Left lower leg: No edema.  Skin:    General: Skin is warm and dry.  Neurological:     General: No focal deficit present.     Mental Status: He is alert and oriented to person, place, and time.     Cranial Nerves: No cranial nerve deficit.     Motor: No  weakness.  Psychiatric:        Mood and Affect: Mood normal.        Behavior: Behavior normal.      Results for orders placed or performed in visit on 04/14/23  POCT CBG (Fasting -  Glucose)  Result Value Ref Range   Glucose Fasting, POC 89 70 - 99 mg/dL    Recent Results (from the past 2160 hour(Calaya Gildner))  Comprehensive metabolic panel     Status: None   Collection Time: 04/11/23 10:22 AM  Result Value Ref Range   Glucose 94 70 - 99 mg/dL   BUN 19 6 - 24 mg/dL   Creatinine, Ser 6.96 0.76 - 1.27 mg/dL   eGFR 76 >29 BM/WUX/3.24   BUN/Creatinine Ratio 16 9 - 20   Sodium 143 134 - 144 mmol/L   Potassium 4.3 3.5 - 5.2 mmol/L   Chloride 104 96 - 106 mmol/L   CO2 24 20 - 29 mmol/L   Calcium 9.2 8.7 - 10.2 mg/dL   Total Protein 6.6 6.0 - 8.5 g/dL   Albumin 4.6 4.1 - 5.1 g/dL   Globulin, Total 2.0 1.5 - 4.5 g/dL   Bilirubin Total 0.6 0.0 - 1.2 mg/dL   Alkaline Phosphatase 67 44 - 121 IU/L   AST 15 0 - 40 IU/L   ALT 22 0 - 44 IU/L  CBC With Diff/Platelet     Status: None   Collection Time: 04/11/23 10:22 AM  Result Value Ref Range   WBC 4.7 3.4 - 10.8 x10E3/uL    Comment: **Effective April 21, 2023 profile 401027 WBC will be made**   non-orderable as a stand-alone order code.    RBC 5.06 4.14 - 5.80 x10E6/uL   Hemoglobin 15.3 13.0 - 17.7 g/dL   Hematocrit 25.3 66.4 - 51.0 %   MCV 93 79 - 97 fL   MCH 30.2 26.6 - 33.0 pg   MCHC 32.6 31.5 - 35.7 g/dL   RDW 40.3 47.4 - 25.9 %   Platelets 164 150 - 450 x10E3/uL   Neutrophils 57 Not Estab. %   Lymphs 28 Not Estab. %   Monocytes 11 Not Estab. %   Eos 3 Not Estab. %   Basos 1 Not Estab. %   Neutrophils Absolute 2.7 1.4 - 7.0 x10E3/uL   Lymphocytes Absolute 1.3 0.7 - 3.1 x10E3/uL   Monocytes Absolute 0.5 0.1 - 0.9 x10E3/uL   EOS (ABSOLUTE) 0.1 0.0 - 0.4 x10E3/uL   Basophils Absolute 0.0 0.0 - 0.2 x10E3/uL   Immature Granulocytes 0 Not Estab. %   Immature Grans (Abs) 0.0 0.0 - 0.1 x10E3/uL  Lipid panel     Status: Abnormal    Collection Time: 04/11/23 10:22 AM  Result Value Ref Range   Cholesterol, Total 195 100 - 199 mg/dL   Triglycerides 563 0 - 149 mg/dL   HDL 39 (L) >87 mg/dL   VLDL Cholesterol Cal 21 5 - 40 mg/dL   LDL Chol Calc (NIH) 564 (H) 0 - 99 mg/dL   Chol/HDL Ratio 5.0 0.0 - 5.0 ratio    Comment:                                   T. Chol/HDL Ratio                                             Men  Women                               1/2 Avg.Risk  3.4    3.3                                   Avg.Risk  5.0    4.4                                2X Avg.Risk  9.6    7.1                                3X Avg.Risk 23.4   11.0   Hemoglobin A1c     Status: Abnormal   Collection Time: 04/11/23 10:22 AM  Result Value Ref Range   Hgb A1c MFr Bld 6.0 (H) 4.8 - 5.6 %    Comment:          Prediabetes: 5.7 - 6.4          Diabetes: >6.4          Glycemic control for adults with diabetes: <7.0    Est. average glucose Bld gHb Est-mCnc 126 mg/dL  POCT CBG (Fasting - Glucose)     Status: None   Collection Time: 04/14/23  1:11 PM  Result Value Ref Range   Glucose Fasting, POC 89 70 - 99 mg/dL      Assessment & Plan:  As per problem list. Increase statin.  Problem List Items Addressed This Visit       Digestive   Gastroesophageal reflux disease without esophagitis   Relevant Medications   pantoprazole (PROTONIX) 40 MG tablet   Other Relevant Orders   Ambulatory referral to Gastroenterology     Endocrine   Controlled type 2 diabetes mellitus without complication, without long-term current use of insulin (HCC) - Primary   Relevant Medications   RYBELSUS 7 MG TABS   rosuvastatin (CRESTOR) 40 MG tablet   Other Relevant Orders   POCT CBG (Fasting - Glucose) (Completed)     Other   Mixed hyperlipidemia   Relevant Medications   rosuvastatin (CRESTOR) 40 MG tablet   Other Relevant Orders   CK   Hepatic function panel   Lipid panel   Right inguinal hernia   Relevant Orders   Ambulatory referral to  General Surgery    Return in about 3 months (around 07/15/2023) for fu with labs prior.   Total time spent: 30 minutes  Luna Fuse, MD  04/14/2023   This document may have been prepared by [facility] Voice Recognition software and as such may include unintentional dictation errors.

## 2023-04-23 ENCOUNTER — Ambulatory Visit: Payer: Self-pay | Admitting: Surgery

## 2023-04-28 ENCOUNTER — Ambulatory Visit: Payer: BC Managed Care – PPO | Admitting: Surgery

## 2023-05-05 ENCOUNTER — Ambulatory Visit (INDEPENDENT_AMBULATORY_CARE_PROVIDER_SITE_OTHER): Payer: BC Managed Care – PPO | Admitting: Surgery

## 2023-05-05 ENCOUNTER — Encounter: Payer: Self-pay | Admitting: Surgery

## 2023-05-05 VITALS — BP 123/75 | HR 80 | Temp 98.4°F | Ht 72.0 in | Wt 202.2 lb

## 2023-05-05 DIAGNOSIS — K4020 Bilateral inguinal hernia, without obstruction or gangrene, not specified as recurrent: Secondary | ICD-10-CM

## 2023-05-05 DIAGNOSIS — K4090 Unilateral inguinal hernia, without obstruction or gangrene, not specified as recurrent: Secondary | ICD-10-CM

## 2023-05-05 DIAGNOSIS — K429 Umbilical hernia without obstruction or gangrene: Secondary | ICD-10-CM | POA: Diagnosis not present

## 2023-05-05 NOTE — Progress Notes (Addendum)
Patient ID: Leno Tasca, male   DOB: June 30, 1975, 47 y.o.   MRN: [id number]  HPI Delrico Shenkman is a 47 y.o. male seen in consultation at the request of Dr. Ellsworth Lennox.  He rePorts he noticed a right inguinal bulge for the last month or so.  He also reports that the bulge gets worse when he stands up and does some Valsalva maneuvers.  States that he does not have any pain.  He denies any fevers and chills he denies any prior abdominal operations.  He is able to perform more than 4 METS of activity without any shortness of breath or chest pain.  He did have a prior bilateral pneumonia 4 years ago that required hospitalization but has fully recovered.  Hemoglobin A1c 6, BC and CMP is completely normal except for slight elevation of the serum creatinine of 1.19   HPI  Past Medical History:  Diagnosis Date   Acid reflux    Speech impediment     Past Surgical History:  Procedure Laterality Date   TONSILLECTOMY      Family History  Problem Relation Age of Onset   Healthy Mother    Pneumonia Father    Diabetes Maternal Grandmother     Social History Social History   Tobacco Use   Smoking status: Former    Current packs/day: 0.00    Types: Cigars, Cigarettes    Quit date: 11/2015    Years since quitting: 7.4   Smokeless tobacco: Never   Tobacco comments:    smokes 1-2 cigars daily for 0.5 year--07/03/2021  Vaping Use   Vaping status: Never Used  Substance Use Topics   Alcohol use: Yes    Comment: occasionally    Drug use: Not Currently    Types: Marijuana    Comment: quit 2017    Allergies  Allergen Reactions   Penicillins Hives    Current Outpatient Medications  Medication Sig Dispense Refill   albuterol (VENTOLIN HFA) 108 (90 Base) MCG/ACT inhaler Inhale 1-2 puffs into the lungs every 6 (six) hours as needed for wheezing or shortness of breath. 1 each 2   cetirizine (ZYRTEC) 10 MG tablet Take 1 tablet (10 mg total) by mouth daily. 30 tablet 0   glucose blood (ONETOUCH  ULTRA TEST) test strip Use with device to check sugars 2-3 times daily 100 each 12   ibuprofen (ADVIL,MOTRIN) 800 MG tablet Take 800 mg by mouth every 6 (six) hours as needed.     ipratropium-albuterol (DUONEB) 0.5-2.5 (3) MG/3ML SOLN Take 3 mLs by nebulization every 6 (six) hours. 360 mL 0   OneTouch UltraSoft 2 Lancets MISC 1 Units by Does not apply route 2 (two) times daily. 100 each 2   pantoprazole (PROTONIX) 40 MG tablet Take 1 tablet (40 mg total) by mouth every morning. 90 tablet 1   rosuvastatin (CRESTOR) 40 MG tablet Take 1 tablet (40 mg total) by mouth daily. 90 tablet 1   RYBELSUS 7 MG TABS Take 1 tablet by mouth daily.     UBRELVY 50 MG TABS Take by mouth.     No current facility-administered medications for this visit.     Review of Systems Full ROS  was asked and was negative except for the information on the HPI  Physical Exam There were no vitals taken for this visit. CONSTITUTIONAL: NAD. EYES: Pupils are equal, round,Sclera are non-icteric. EARS, NOSE, MOUTH AND THROAT:  The oral mucosa is pink and moist. Hearing is intact to voice. LYMPH NODES:  Lymph nodes in the neck are normal. RESPIRATORY:  Lungs are clear. There is normal respiratory effort, with equal breath sounds bilaterally, and without pathologic use of accessory muscles. CARDIOVASCULAR: Heart is regular without murmurs, gallops, or rubs. GI: The abdomen is  soft, nontender, and nondistended. There are no palpable masses. There is no hepatosplenomegaly. There are normal bowel sounds. Reducible Right Inguinal hernia and reducible Left IH. Right larger than left. He also has reducible umbilical hernia GU: Rectal deferred.   MUSCULOSKELETAL: Normal muscle strength and tone. No cyanosis or edema.   SKIN: Turgor is good and there are no pathologic skin lesions or ulcers. NEUROLOGIC: Motor and sensation is grossly normal. Cranial nerves are grossly intact. PSYCH:  Oriented to person, place and time. Affect is  normal.  Data Reviewed  I have personally reviewed the patient's imaging, laboratory findings and medical records.    Assessment/Plan 46 year old male with  bilateral inguinal hernia and umbilical hernia that are currently asymptomatic.  Discussed with the patient in detail about his disease process.  Options of observation versus surgical repair were discussed with him at length.  Since they are truly asymptomatic repair is not an absolute necessity. DisCussed with him that they will likely become larger feet at some point in time did start to become symptomatic we will definitely need to have them repair. He wants to wait out and wants to have another follow-up in a couple months.  We did talk about the different modalities for repair and I do think that if we were to plan any surgical repair robotic approach will be the best fit for him as he will address the bilateral hernias and umbilical hernia. We Will make an appointment for him to follow-up with has in a couple months I spent 45 minutes in this encounter including personally reviewing imaging studies, personally reviewing medical record, coordinating his care, placing orders and performing documentation Copy of this report was sent to the referring provider  Sterling Big, MD FACS General Surgeon 05/05/2023, 10:38 AM

## 2023-05-05 NOTE — Patient Instructions (Addendum)
Inguinal Hernia, Adult An inguinal hernia is when fat or your intestines push through a weak spot in a muscle where your leg meets your lower belly (groin). This causes a bulge. This kind of hernia could also be: In your scrotum, if you are male. In folds of skin around your vagina, if you are male. There are three types of inguinal hernias: Hernias that can be pushed back into the belly (are reducible). This type rarely causes pain. Hernias that cannot be pushed back into the belly (are incarcerated). Hernias that cannot be pushed back into the belly and lose their blood supply (are strangulated). This type needs emergency surgery. What are the causes? This condition is caused by having a weak spot in the muscles or tissues in your groin. This develops over time. The hernia may poke through the weak spot when you strain your lower belly muscles all of a sudden, such as when you: Lift a heavy object. Strain to poop (have a bowel movement). Trouble pooping (constipation) can lead to straining. Cough. What increases the risk? This condition is more likely to develop in: Males. Pregnant females. People who: Are overweight. Work in jobs that require long periods of standing or heavy lifting. Have had an inguinal hernia before. Smoke or have lung disease. These factors can lead to long-term (chronic) coughing. What are the signs or symptoms? Symptoms may depend on the size of the hernia. Often, a small hernia has no symptoms. Symptoms of a larger hernia may include: A bulge in the groin area. This is easier to see when standing. You might not be able to see it when you are lying down. Pain or burning in the groin. This may get worse when you lift, strain, or cough. A dull ache or a feeling of pressure in the groin. An abnormal bulge in the scrotum, in males. Symptoms of a strangulated inguinal hernia may include: A bulge in your groin that is very painful and tender to the touch. A bulge  that turns red or purple. Fever, feeling like you may vomit (nausea), and vomiting. Not being able to poop or to pass gas. How is this treated? Treatment depends on the size of your hernia and whether you have symptoms. If you do not have symptoms, your doctor may have you watch your hernia carefully and have you come in for follow-up visits. If your hernia is large or if you have symptoms, you may need surgery to repair the hernia. Follow these instructions at home: Lifestyle Avoid lifting heavy objects. Avoid standing for long amounts of time. Do not smoke or use any products that contain nicotine or tobacco. If you need help quitting, ask your doctor. Stay at a healthy weight. Prevent trouble pooping You may need to take these actions to prevent or treat trouble pooping: Drink enough fluid to keep your pee (urine) pale yellow. Take over-the-counter or prescription medicines. Eat foods that are high in fiber. These include beans, whole grains, and fresh fruits and vegetables. Limit foods that are high in fat and sugar. These include fried or sweet foods. General instructions You may try to push your hernia back in place by very gently pressing on it when you are lying down. Do not try to push the bulge back in if it will not go in easily. Watch your hernia for any changes in shape, size, or color. Tell your doctor if you see any changes. Take over-the-counter and prescription medicines only as told by your doctor. Keep   all follow-up visits. Contact a doctor if: You have a fever or chills. You have new symptoms. Your symptoms get worse. Get help right away if: You have pain in your groin that gets worse all of a sudden. You have a bulge in your groin that: Gets bigger all of a sudden, and it does not get smaller after that. Turns red or purple. Is painful when you touch it. You are a male, and you have: Sudden pain in your scrotum. A sudden change in the size of your scrotum. You  cannot push the hernia back in place by very gently pressing on it when you are lying down. You feel like you may vomit, and that feeling does not go away. You keep vomiting. You have a fast heartbeat. You cannot poop or pass gas. These symptoms may be an emergency. Get help right away. Call your local emergency services (911 in the [place] Do not wait to see if the symptoms will go away. Do not drive yourself to the hospital. Summary An inguinal hernia is when fat or your intestines push through a weak spot in a muscle where your leg meets your lower belly (groin). This causes a bulge. If you do not have symptoms, you may not need treatment. If you have symptoms or a large hernia, you may need surgery. Avoid lifting heavy objects. Also, avoid standing for long amounts of time. Do not try to push the bulge back in if it will not go in easily. This information is not intended to replace advice given to you by your health care provider. Make sure you discuss any questions you have with your health care provider. Document Revised: 01/04/2020 Document Reviewed: 01/04/2020 Elsevier Patient Education  2024 Elsevier Inc.  

## 2023-05-05 NOTE — Addendum Note (Signed)
Addended by: Sterling Big F on: 05/05/2023 11:03 AM   Modules accepted: Level of Service

## 2023-07-07 ENCOUNTER — Encounter: Payer: Self-pay | Admitting: Surgery

## 2023-07-07 ENCOUNTER — Ambulatory Visit (INDEPENDENT_AMBULATORY_CARE_PROVIDER_SITE_OTHER): Payer: BC Managed Care – PPO | Admitting: Surgery

## 2023-07-07 ENCOUNTER — Telehealth: Payer: Self-pay | Admitting: Surgery

## 2023-07-07 VITALS — BP 105/66 | HR 79 | Temp 98.2°F | Ht 72.0 in | Wt 205.0 lb

## 2023-07-07 DIAGNOSIS — K4020 Bilateral inguinal hernia, without obstruction or gangrene, not specified as recurrent: Secondary | ICD-10-CM

## 2023-07-07 NOTE — Telephone Encounter (Signed)
Left message for patient to call, please inform him of the following regarding scheduled surgery with Dr. Everlene Farrier.   Pre-Admission date/time, and Surgery date at [facility].  Surgery Date: 07/31/23 Preadmission Testing Date: 07/23/23 (phone 1p-4p)  Also patient will need to call at [phone], between 1-3:00pm the day before surgery, to find out what time to arrive for surgery.

## 2023-07-07 NOTE — Patient Instructions (Signed)
 You have chose to have your hernia repaired. This will be done by Dr. Everlene Farrier at [facility].  Please see your (blue) Pre-care information that you have been given today. Our surgery scheduler will call you to verify surgery date and to go over information.   You will need to arrange to be out of work for approximately 1-2 weeks and then you Burnstein return with a lifting restriction for 4 more weeks. If you have FMLA or Disability paperwork that needs to be filled out, please have your company fax your paperwork to [phone] or you Lowman drop this by either office. This paperwork will be filled out within 3 days after your surgery has been completed.  You Courville have a bruise in your groin and also swelling and brusing in your testicle area. You Bensen use ice 4-5 times daily for 15-20 minutes each time. Make sure that you place a barrier between you and the ice pack. To decrease the swelling, you Foglio roll up a bath towel and place it vertically in between your thighs with your testicles resting on the towel. You will want to keep this area elevated as much as possible for several days following surgery.    Inguinal Hernia, Adult Muscles help keep everything in the body in its proper place. But if a weak spot in the muscles develops, something can poke through. That is called a hernia. When this happens in the lower part of the belly (abdomen), it is called an inguinal hernia. (It takes its name from a part of the body in this region called the inguinal canal.) A weak spot in the wall of muscles lets some fat or part of the small intestine bulge through. An inguinal hernia can develop at any age. Men get them more often than women. CAUSES  In adults, an inguinal hernia develops over time. It can be triggered by: Suddenly straining the muscles of the lower abdomen. Lifting heavy objects. Straining to have a bowel movement. Difficult bowel movements (constipation) can lead to this. Constant coughing. This  Jun be caused by smoking or lung disease. Being overweight. Being pregnant. Working at a job that requires long periods of standing or heavy lifting. Having had an inguinal hernia before. One type can be an emergency situation. It is called a strangulated inguinal hernia. It develops if part of the small intestine slips through the weak spot and cannot get back into the abdomen. The blood supply can be cut off. If that happens, part of the intestine Cullen die. This situation requires emergency surgery. SYMPTOMS  Often, a small inguinal hernia has no symptoms. It is found when a healthcare provider does a physical exam. Larger hernias usually have symptoms.  In adults, symptoms Fabela include: A lump in the groin. This is easier to see when the person is standing. It might disappear when lying down. In men, a lump in the scrotum. Pain or burning in the groin. This occurs especially when lifting, straining or coughing. A dull ache or feeling of pressure in the groin. Signs of a strangulated hernia can include: A bulge in the groin that becomes very painful and tender to the touch. A bulge that turns red or purple. Fever, nausea and vomiting. Inability to have a bowel movement or to pass gas. DIAGNOSIS  To decide if you have an inguinal hernia, a healthcare provider will probably do a physical examination. This will include asking questions about any symptoms you have noticed. The healthcare provider might  feel the groin area and ask you to cough. If an inguinal hernia is felt, the healthcare provider Yamamoto try to slide it back into the abdomen. Usually no other tests are needed. TREATMENT  Treatments can vary. The size of the hernia makes a difference. Options include: Watchful waiting. This is often suggested if the hernia is small and you have had no symptoms. No medical procedure will be done unless symptoms develop. You will need to watch closely for symptoms. If any occur, contact your  healthcare provider right away. Surgery. This is used if the hernia is larger or you have symptoms. Open surgery. This is usually an outpatient procedure (you will not stay overnight in a hospital). An cut (incision) is made through the skin in the groin. The hernia is put back inside the abdomen. The weak area in the muscles is then repaired by herniorrhaphy or hernioplasty. Herniorrhaphy: in this type of surgery, the weak muscles are sewn back together. Hernioplasty: a patch or mesh is used to close the weak area in the abdominal wall. Laparoscopy. In this procedure, a surgeon makes small incisions. A thin tube with a tiny video camera (called a laparoscope) is put into the abdomen. The surgeon repairs the hernia with mesh by looking with the video camera and using two long instruments. HOME CARE INSTRUCTIONS  After surgery to repair an inguinal hernia: You will need to take pain medicine prescribed by your healthcare provider. Follow all directions carefully. You will need to take care of the wound from the incision. Your activity will be restricted for awhile. This will probably include no heavy lifting for several weeks. You also should not do anything too active for a few weeks. When you can return to work will depend on the type of job that you have. During "watchful waiting" periods, you should: Maintain a healthy weight. Eat a diet high in fiber (fruits, vegetables and whole grains). Drink plenty of fluids to avoid constipation. This means drinking enough water and other liquids to keep your urine clear or pale yellow. Do not lift heavy objects. Do not stand for long periods of time. Quit smoking. This should keep you from developing a frequent cough. SEEK MEDICAL CARE IF:  A bulge develops in your groin area. You feel pain, a burning sensation or pressure in the groin. This might be worse if you are lifting or straining. You develop a fever of more than 100.5 F (38.1 C). SEEK  IMMEDIATE MEDICAL CARE IF:  Pain in the groin increases suddenly. A bulge in the groin gets bigger suddenly and does not go down. For men, there is sudden pain in the scrotum. Or, the size of the scrotum increases. A bulge in the groin area becomes red or purple and is painful to touch. You have nausea or vomiting that does not go away. You feel your heart beating much faster than normal. You cannot have a bowel movement or pass gas. You develop a fever of more than 102.0 F (38.9 C).   This information is not intended to replace advice given to you by your health care provider. Make sure you discuss any questions you have with your health care provider.   Document Released: 09/22/2008 Document Revised: 07/29/2011 Document Reviewed: 11/07/2014 Elsevier Interactive Patient Education [employer]

## 2023-07-08 NOTE — Telephone Encounter (Signed)
 Patient calls back, he is now informed of all dates regarding surgery.

## 2023-07-08 NOTE — Progress Notes (Signed)
Outpatient Surgical Follow Up  07/08/2023  Brett [name] is an 48 y.o. male.   Chief Complaint  Patient presents with   Follow-up    HPI: Brett [name] is a 48 y.o. male seen in F/u. He rePorts he noticed a right inguinal bulge for the last few months. I did see him 2 months ago,  He also reports that the bulge gets worse when he stands up and does some Valsalva maneuvers.  States that he does now have some mild discomfort mild intermittent.Marland Kitchen  He denies any fevers and chills he denies any prior abdominal operations.  He is able to perform more than 4 METS of activity without any shortness of breath or chest pain.     Hemoglobin A1c 6, BC and CMP is completely normal except for slight elevation of the serum creatinine of 1.19    Past Medical History:  Diagnosis Date   Acid reflux    Diabetes mellitus without complication (HCC)    Speech impediment     Past Surgical History:  Procedure Laterality Date   TONSILLECTOMY      Family History  Problem Relation Age of Onset   Healthy Mother    Pneumonia Father    Diabetes Maternal Grandmother     Social History:  reports that he quit smoking about 7 years ago. His smoking use included cigars and cigarettes. He has been exposed to tobacco smoke. He has never used smokeless tobacco. He reports current alcohol use. He reports that he does not currently use drugs after having used the following drugs: Marijuana.  Allergies:  Allergies  Allergen Reactions   Penicillins Hives    Medications reviewed.    ROS Full ROS performed and is otherwise negative other than what is stated in HPI   BP 105/66   Pulse 79   Temp 98.2 F (36.8 C)   Ht 6' (1.829 m)   Wt 205 lb (93 kg)   SpO2 99%   BMI 27.80 kg/m   Physical Exam  There were no vitals taken for this visit. CONSTITUTIONAL: NAD. EYES: Pupils are equal, round,Sclera are non-icteric. EARS, NOSE, MOUTH AND THROAT:  The oral mucosa is pink and moist. Hearing is intact to  voice. LYMPH NODES:  Lymph nodes in the neck are normal. RESPIRATORY:  Lungs are clear. There is normal respiratory effort, with equal breath sounds bilaterally, and without pathologic use of accessory muscles. CARDIOVASCULAR: Heart is regular without murmurs, gallops, or rubs. GI: The abdomen is  soft, nontender, and nondistended. There are no palpable masses. There is no hepatosplenomegaly. There are normal bowel sounds. Reducible Right Inguinal hernia and reducible Left IH. Right larger than left. He also has reducible umbilical hernia GU: Rectal deferred.   MUSCULOSKELETAL: Normal muscle strength and tone. No cyanosis or edema.   SKIN: Turgor is good and there are no pathologic skin lesions or ulcers. NEUROLOGIC: Motor and sensation is grossly normal. Cranial nerves are grossly intact. PSYCH:  Oriented to person, place and time. Affect is normal.   Data Reviewed   I have personally reviewed the patient's imaging, laboratory findings and medical records.     Assessment/Plan 48 year old male with  bilateral inguinal hernias that are symptomatic and asymptomatic [facility].  Discussed with the patient in detail about his disease process. Given that his symptoms have progressed I do recommend repair and I do think robotic approach is a good fit./ Procedure d/w the pt in detail. Risks, benefits and possible complications including but not limited to  recurrence, chronic pain, bowel injuries.I spent 40 minutes in this encounter including personally reviewing imaging studies, personally reviewing medical record, coordinating his care, placing orders and performing documentation   Sterling Big, MD [facility] General Surgeon

## 2023-07-08 NOTE — Telephone Encounter (Signed)
Updated information regarding rescheduled surgery at patient's request.  Please inform him of the following.    Pre-Admission date/time, and Surgery date at [facility].  Surgery Date: 07/29/23 Preadmission Testing Date: 07/23/23 (phone 1p-4p)  Also patient will need to call at [phone], between 1-3:00pm the day before surgery, to find out what time to arrive for surgery.

## 2023-07-08 NOTE — H&P (View-Only) (Signed)
 Outpatient Surgical Follow Up  07/08/2023  Brett [name] is an 48 y.o. male.   Chief Complaint  Patient presents with   Follow-up    HPI: Brett [name] is a 48 y.o. male seen in F/u. He rePorts he noticed a right inguinal bulge for the last few months. I did see him 2 months ago,  He also reports that the bulge gets worse when he stands up and does some Valsalva maneuvers.  States that he does now have some mild discomfort mild intermittent.Marland Kitchen  He denies any fevers and chills he denies any prior abdominal operations.  He is able to perform more than 4 METS of activity without any shortness of breath or chest pain.     Hemoglobin A1c 6, BC and CMP is completely normal except for slight elevation of the serum creatinine of 1.19    Past Medical History:  Diagnosis Date   Acid reflux    Diabetes mellitus without complication (HCC)    Speech impediment     Past Surgical History:  Procedure Laterality Date   TONSILLECTOMY      Family History  Problem Relation Age of Onset   Healthy Mother    Pneumonia Father    Diabetes Maternal Grandmother     Social History:  reports that he quit smoking about 7 years ago. His smoking use included cigars and cigarettes. He has been exposed to tobacco smoke. He has never used smokeless tobacco. He reports current alcohol use. He reports that he does not currently use drugs after having used the following drugs: Marijuana.  Allergies:  Allergies  Allergen Reactions   Penicillins Hives    Medications reviewed.    ROS Full ROS performed and is otherwise negative other than what is stated in HPI   BP 105/66   Pulse 79   Temp 98.2 F (36.8 C)   Ht 6' (1.829 m)   Wt 205 lb (93 kg)   SpO2 99%   BMI 27.80 kg/m   Physical Exam  There were no vitals taken for this visit. CONSTITUTIONAL: NAD. EYES: Pupils are equal, round,Sclera are non-icteric. EARS, NOSE, MOUTH AND THROAT:  The oral mucosa is pink and moist. Hearing is intact to  voice. LYMPH NODES:  Lymph nodes in the neck are normal. RESPIRATORY:  Lungs are clear. There is normal respiratory effort, with equal breath sounds bilaterally, and without pathologic use of accessory muscles. CARDIOVASCULAR: Heart is regular without murmurs, gallops, or rubs. GI: The abdomen is  soft, nontender, and nondistended. There are no palpable masses. There is no hepatosplenomegaly. There are normal bowel sounds. Reducible Right Inguinal hernia and reducible Left IH. Right larger than left. He also has reducible umbilical hernia GU: Rectal deferred.   MUSCULOSKELETAL: Normal muscle strength and tone. No cyanosis or edema.   SKIN: Turgor is good and there are no pathologic skin lesions or ulcers. NEUROLOGIC: Motor and sensation is grossly normal. Cranial nerves are grossly intact. PSYCH:  Oriented to person, place and time. Affect is normal.   Data Reviewed   I have personally reviewed the patient's imaging, laboratory findings and medical records.     Assessment/Plan 48 year old male with  bilateral inguinal hernias that are symptomatic and asymptomatic [facility].  Discussed with the patient in detail about his disease process. Given that his symptoms have progressed I do recommend repair and I do think robotic approach is a good fit./ Procedure d/w the pt in detail. Risks, benefits and possible complications including but not limited to  recurrence, chronic pain, bowel injuries.I spent 40 minutes in this encounter including personally reviewing imaging studies, personally reviewing medical record, coordinating his care, placing orders and performing documentation   Sterling Big, MD [facility] General Surgeon

## 2023-07-11 ENCOUNTER — Other Ambulatory Visit: Payer: Self-pay

## 2023-07-11 MED ORDER — RYBELSUS 7 MG PO TABS: 1.0000 | ORAL_TABLET | Freq: Every day | ORAL | 0 refills | Status: DC

## 2023-07-15 ENCOUNTER — Ambulatory Visit: Payer: BC Managed Care – PPO | Admitting: Internal Medicine

## 2023-07-23 ENCOUNTER — Encounter
Admission: RE | Admit: 2023-07-23 | Discharge: 2023-07-23 | Disposition: A | Discharge status: Home / Self Care | Payer: BC Managed Care – PPO | Source: Ambulatory Visit | Attending: Surgery | Admitting: Surgery

## 2023-07-23 VITALS — Ht 72.0 in | Wt 205.0 lb

## 2023-07-23 DIAGNOSIS — Z01810 Encounter for preprocedural cardiovascular examination: Secondary | ICD-10-CM

## 2023-07-23 DIAGNOSIS — E119 Type 2 diabetes mellitus without complications: Secondary | ICD-10-CM

## 2023-07-23 DIAGNOSIS — Z01812 Encounter for preprocedural laboratory examination: Secondary | ICD-10-CM

## 2023-07-23 DIAGNOSIS — Z01818 Encounter for other preprocedural examination: Secondary | ICD-10-CM

## 2023-07-23 HISTORY — DX: Personal history of nicotine dependence: Z87.891

## 2023-07-23 HISTORY — DX: Mixed hyperlipidemia: E78.2

## 2023-07-23 HISTORY — DX: Type 2 diabetes mellitus without complications: E11.9

## 2023-07-23 HISTORY — DX: Bilateral inguinal hernia, without obstruction or gangrene, not specified as recurrent: K40.20

## 2023-07-23 NOTE — Patient Instructions (Signed)
 Your procedure is scheduled on:07-29-23 Tuesday Report to the Registration Desk on the 1st floor of the [facility].Then proceed to the 2nd floor Surgery Desk To find out your arrival time, please call [phone] between 1PM - 3PM on:07-28-23 Monday If your arrival time is 6:00 am, do not arrive before that time as the [facility] entrance doors do not open until 6:00 am.  REMEMBER: Instructions that are not followed completely may result in serious medical risk, up to and including death; or upon the discretion of your surgeon and anesthesiologist your surgery may need to be rescheduled.  Do not eat food after midnight the night before surgery.  No gum chewing or hard candies.  You may however, drink Water up to 2 hours before you are scheduled to arrive for your surgery. Do not drink anything within 2 hours of your scheduled arrival time.  One week prior to surgery:Stop NOW (07-23-23) Stop Anti-inflammatories (NSAIDS) such as Advil, Aleve, Ibuprofen, Motrin, Naproxen, Naprosyn and Aspirin based products such as Excedrin, Goody's Powder, BC Powder. Stop ANY OVER THE COUNTER supplements until after surgery.  You may however, continue to take Tylenol if needed for pain up until the day of surgery.  Stop RYBELSUS 1 day prior to surgery-Last dose will be on 07-27-23 Sunday  Continue taking all of your other prescription medications up until the day of surgery.  ON THE DAY OF SURGERY ONLY TAKE THESE MEDICATIONS WITH SIPS OF WATER: -cetirizine (ZYRTEC)  -pantoprazole (PROTONIX)   Bring your Albuterol Inhaler to the hospital the day of surgery  No Alcohol for 24 hours before or after surgery.  No Smoking including e-cigarettes for 24 hours before surgery.  No chewable tobacco products for at least 6 hours before surgery.  No nicotine patches on the day of surgery.  Do not use any "recreational" drugs for at least a week (preferably 2 weeks) before your surgery.  Please be advised that  the combination of cocaine and anesthesia may have negative outcomes, up to and including death. If you test positive for cocaine, your surgery will be cancelled.  On the morning of surgery brush your teeth with toothpaste and water, you may rinse your mouth with mouthwash if you wish. Do not swallow any toothpaste or mouthwash.  Use CHG Soap as directed on instruction sheet.  Do not wear jewelry, make-up, hairpins, clips or nail polish.  For welded (permanent) jewelry: bracelets, anklets, waist bands, etc.  Please have this removed prior to surgery.  If it is not removed, there is a chance that hospital personnel will need to cut it off on the day of surgery.  Do not wear lotions, powders, or perfumes.   Do not shave body hair from the neck down 48 hours before surgery.  Contact lenses, hearing aids and dentures may not be worn into surgery.  Do not bring valuables to the hospital. [facility] is not responsible for any missing/lost belongings or valuables.   Notify your doctor if there is any change in your medical condition (cold, fever, infection).  Wear comfortable clothing (specific to your surgery type) to the hospital.  After surgery, you can help prevent lung complications by doing breathing exercises.  Take deep breaths and    Preparing for Surgery with CHLORHEXIDINE GLUCONATE (CHG) Soap  Chlorhexidine Gluconate (CHG) Soap  o An antiseptic cleaner that kills germs and bonds with the skin to continue killing germs even after washing  o Used for showering the night before surgery and  morning of surgery  Before surgery, you can play an important role by reducing the number of germs on your skin.  CHG (Chlorhexidine gluconate) soap is an antiseptic cleanser which kills germs and bonds with the skin to continue killing germs even after washing.  Please do not use if you have an allergy to CHG or antibacterial soaps. If your skin becomes reddened/irritated stop using the  CHG.  1. Shower the NIGHT BEFORE SURGERY and the MORNING OF SURGERY with CHG soap.  2. If you choose to wash your hair, wash your hair first as usual with your normal shampoo.  3. After shampooing, rinse your hair and body thoroughly to remove the shampoo.  4. Use CHG as you would any other liquid soap. You can apply CHG directly to the skin and wash gently with a scrungie or a clean washcloth.  5. Apply the CHG soap to your body only from the neck down. Do not use on open wounds or open sores. Avoid contact with your eyes, ears, mouth, and genitals (private parts). Wash face and genitals (private parts) with your normal soap.  6. Wash thoroughly, paying special attention to the area where your surgery will be performed.  7. Thoroughly rinse your body with warm water.  8. Do not shower/wash with your normal soap after using and rinsing off the CHG soap.  9. Pat yourself dry with a clean towel.  10. Wear clean pajamas to bed the night before surgery.  12. Place clean sheets on your bed the night of your first shower and do not sleep with pets.  13. Shower again with the CHG soap on the day of surgery prior to arriving at the hospital.  14. Do not apply any deodorants/lotions/powders.  15. Please wear clean clothes to the hospital. When coughing or sneezing, hold a pillow firmly against your incision with both hands. This is called "splinting." Doing this helps protect your incision. It also decreases belly discomfort.  If you are being admitted to the hospital overnight, leave your suitcase in the car. After surgery it may be brought to your room.  In case of increased patient census, it may be necessary for you, the patient, to continue your postoperative care in the Same Day Surgery department.  If you are being discharged the day of surgery, you will not be allowed to drive home. You will need a responsible individual to drive you home and stay with you for 24 hours after  surgery.   If you are taking public transportation, you will need to have a responsible individual with you.  Please call the Pre-admissions Testing Dept. at [phone] if you have any questions about these instructions.  Surgery Visitation Policy:  Patients having surgery or a procedure may have two visitors.  Children under the age of 52 must have an adult with them who is not the patient.  Temporary Visitor Restrictions Due to increasing cases of flu, RSV and COVID-19: Children ages 72 and under will not be able to visit patients in [facility] hospitals under most circumstances.

## 2023-07-24 ENCOUNTER — Encounter
Admission: RE | Admit: 2023-07-24 | Discharge: 2023-07-24 | Disposition: A | Discharge status: Home / Self Care | Source: Ambulatory Visit | Attending: Surgery | Admitting: Surgery

## 2023-07-24 ENCOUNTER — Other Ambulatory Visit

## 2023-07-24 DIAGNOSIS — Z01810 Encounter for preprocedural cardiovascular examination: Secondary | ICD-10-CM

## 2023-07-24 DIAGNOSIS — E782 Mixed hyperlipidemia: Secondary | ICD-10-CM

## 2023-07-24 DIAGNOSIS — Z01812 Encounter for preprocedural laboratory examination: Secondary | ICD-10-CM

## 2023-07-24 DIAGNOSIS — E119 Type 2 diabetes mellitus without complications: Secondary | ICD-10-CM | POA: Insufficient documentation

## 2023-07-24 DIAGNOSIS — Z01818 Encounter for other preprocedural examination: Secondary | ICD-10-CM | POA: Insufficient documentation

## 2023-07-24 LAB — BASIC METABOLIC PANEL
Anion gap: 10 (ref 5–15)
BUN: 18 mg/dL (ref 6–20)
CO2: 26 mmol/L (ref 22–32)
Calcium: 9.2 mg/dL (ref 8.9–10.3)
Chloride: 104 mmol/L (ref 98–111)
Creatinine, Ser: 1 mg/dL (ref 0.61–1.24)
GFR, Estimated: >60 mL/min (ref >60)
Glucose, Bld: 95 mg/dL (ref 70–99)
Potassium: 3.8 mmol/L (ref 3.5–5.1)
Sodium: 140 mmol/L (ref 135–145)

## 2023-07-24 LAB — CBC
HCT: 47.8 % (ref 39.0–52.0)
Hemoglobin: 15.7 g/dL (ref 13.0–17.0)
MCH: 30.1 pg (ref 26.0–34.0)
MCHC: 32.8 g/dL (ref 30.0–36.0)
MCV: 91.7 fL (ref 80.0–100.0)
Platelets: 148 10*3/uL — ABNORMAL LOW (ref 150–400)
RBC: 5.21 MIL/uL (ref 4.22–5.81)
RDW: 12.3 % (ref 11.5–15.5)
WBC: 4.8 10*3/uL (ref 4.0–10.5)
nRBC: 0 % (ref 0.0–0.2)

## 2023-07-25 ENCOUNTER — Encounter: Payer: Self-pay | Admitting: Internal Medicine

## 2023-07-25 ENCOUNTER — Ambulatory Visit: Payer: BC Managed Care – PPO | Admitting: Internal Medicine

## 2023-07-25 VITALS — BP 116/73 | HR 78 | Ht 72.0 in | Wt 202.0 lb

## 2023-07-25 DIAGNOSIS — E782 Mixed hyperlipidemia: Secondary | ICD-10-CM

## 2023-07-25 DIAGNOSIS — E119 Type 2 diabetes mellitus without complications: Secondary | ICD-10-CM

## 2023-07-25 DIAGNOSIS — Z0130 Encounter for examination of blood pressure without abnormal findings: Secondary | ICD-10-CM

## 2023-07-25 LAB — HEPATIC FUNCTION PANEL
ALT: 26 IU/L (ref 0–44)
AST: 20 IU/L (ref 0–40)
Albumin: 4.6 g/dL (ref 4.1–5.1)
Alkaline Phosphatase: 64 IU/L (ref 44–121)
Bilirubin Total: 0.7 mg/dL (ref 0.0–1.2)
Bilirubin, Direct: 0.22 mg/dL (ref 0.00–0.40)
Total Protein: 6.8 g/dL (ref 6.0–8.5)

## 2023-07-25 LAB — LIPID PANEL
Chol/HDL Ratio: 4.1 ratio (ref 0.0–5.0)
Cholesterol, Total: 165 mg/dL (ref 100–199)
HDL: 40 mg/dL (ref >39)
LDL Chol Calc (NIH): 104 mg/dL — ABNORMAL HIGH (ref 0–99)
Triglycerides: 117 mg/dL (ref 0–149)
VLDL Cholesterol Cal: 21 mg/dL (ref 5–40)

## 2023-07-25 LAB — POC CREATINE & ALBUMIN,URINE
Albumin/Creatinine Ratio, Urine, POC: <30
Creatinine, POC: 300 mg/dL
Microalbumin Ur, POC: 80 mg/L

## 2023-07-25 LAB — HEMOGLOBIN A1C
Est. average glucose Bld gHb Est-mCnc: 131 mg/dL
Hgb A1c MFr Bld: 6.2 % — ABNORMAL HIGH (ref 4.8–5.6)

## 2023-07-25 LAB — POCT CBG (FASTING - GLUCOSE)-MANUAL ENTRY: Glucose Fasting, POC: 95 mg/dL (ref 70–99)

## 2023-07-25 LAB — CK: Total CK: 234 U/L (ref 49–439)

## 2023-07-25 MED ORDER — ROSUVASTATIN CALCIUM 40 MG PO TABS: 40.0000 mg | ORAL_TABLET | Freq: Every evening | ORAL | 1 refills | Status: DC

## 2023-07-25 MED ORDER — RYBELSUS 7 MG PO TABS: 1.0000 | ORAL_TABLET | Freq: Every day | ORAL | 2 refills | Status: AC

## 2023-07-25 NOTE — Progress Notes (Signed)
 Established Patient Office Visit  Subjective:  Patient ID: Brett [name], male    DOB: 1976-03-14  Age: 48 y.o. MRN: [id number]  Chief Complaint  Patient presents with   Follow-up    3 month follow up with labs prior    No new complaints, here for lab review and medication refills.     No other concerns at this time.   Past Medical History:  Diagnosis Date   Acid reflux    Bilateral inguinal hernia (BIH)    DM (diabetes mellitus), type 2 (HCC)    Former smoker    History of pneumonia 2019   Mixed hyperlipidemia    Speech impediment     Past Surgical History:  Procedure Laterality Date   TONSILLECTOMY Bilateral    as a child    Social History   Socioeconomic History   Marital status: Married    Spouse name: Not on file   Number of children: Not on file   Years of education: Not on file   Highest education level: Not on file  Occupational History   Not on file  Tobacco Use   Smoking status: Former    Current packs/day: 0.00    Types: Cigars, Cigarettes    Quit date: 11/2015    Years since quitting: 7.6    Passive exposure: Past   Smokeless tobacco: Never   Tobacco comments:    smokes 1-2 cigars daily for 0.5 year--07/03/2021  Vaping Use   Vaping status: Never Used  Substance and Sexual Activity   Alcohol use: Yes    Comment: occasionally    Drug use: Not Currently    Types: Marijuana    Comment: quit 2017   Sexual activity: Not on file  Other Topics Concern   Not on file  Social History Narrative   Not on file   Social Drivers of Health   Financial Resource Strain: Not on file  Food Insecurity: Not on file  Transportation Needs: Not on file  Physical Activity: Not on file  Stress: Not on file  Social Connections: Not on file  Intimate Partner Violence: Not on file    Family History  Problem Relation Age of Onset   Healthy Mother    Pneumonia Father    Diabetes Maternal Grandmother     Allergies  Allergen Reactions   Penicillins  Hives    Outpatient Medications Prior to Visit  Medication Sig   glucose blood (ONETOUCH ULTRA TEST) test strip Use with device to check sugars 2-3 times daily   OneTouch UltraSoft 2 Lancets MISC 1 Units by Does not apply route 2 (two) times daily.   pantoprazole (PROTONIX) 40 MG tablet Take 1 tablet (40 mg total) by mouth every morning.   [DISCONTINUED] albuterol (VENTOLIN HFA) 108 (90 Base) MCG/ACT inhaler Inhale 1-2 puffs into the lungs every 6 (six) hours as needed for wheezing or shortness of breath.   [DISCONTINUED] cetirizine (ZYRTEC) 10 MG tablet Take 1 tablet (10 mg total) by mouth daily. (Patient taking differently: Take 10 mg by mouth every morning.)   [DISCONTINUED] ibuprofen (ADVIL,MOTRIN) 800 MG tablet Take 800 mg by mouth every 6 (six) hours as needed.   [DISCONTINUED] ipratropium-albuterol (DUONEB) 0.5-2.5 (3) MG/3ML SOLN Take 3 mLs by nebulization every 6 (six) hours. (Patient taking differently: Take 3 mLs by nebulization every 6 (six) hours as needed (short of breath).)   [DISCONTINUED] rosuvastatin (CRESTOR) 40 MG tablet Take 1 tablet (40 mg total) by mouth daily. (Patient taking differently: Take  40 mg by mouth every morning.)   [DISCONTINUED] RYBELSUS 7 MG TABS Take 1 tablet (7 mg total) by mouth daily.   No facility-administered medications prior to visit.    Review of Systems  Constitutional:  Positive for weight loss (3 lbs).  HENT: Negative.    Eyes: Negative.   Respiratory: Negative.    Cardiovascular: Negative.   Gastrointestinal: Negative.   Genitourinary: Negative.   Musculoskeletal:  Positive for joint pain (right knee).  Skin: Negative.   Neurological: Negative.   Endo/Heme/Allergies: Negative.   Psychiatric/Behavioral: Negative.         Objective:   BP 116/73   Pulse 78   Ht 6' (1.829 m)   Wt 202 lb (91.6 kg)   SpO2 96%   BMI 27.40 kg/m   Vitals:   07/25/23 1510  BP: 116/73  Pulse: 78  Height: 6' (1.829 m)  Weight: 202 lb (91.6 kg)   SpO2: 96%  BMI (Calculated): 27.39    Physical Exam Vitals reviewed.  Constitutional:      General: He is not in acute distress.    Appearance: Normal appearance. He is obese.  HENT:     Head: Normocephalic.     Left Ear: There is no impacted cerumen.     Nose: Nose normal.     Mouth/Throat:     Mouth: Mucous membranes are moist.     Pharynx: No posterior oropharyngeal erythema.  Eyes:     Extraocular Movements: Extraocular movements intact.     Pupils: Pupils are equal, round, and reactive to light.  Cardiovascular:     Rate and Rhythm: Regular rhythm.     Chest Wall: PMI is not displaced.     Pulses: Normal pulses.     Heart sounds: Normal heart sounds. No murmur heard. Pulmonary:     Effort: Pulmonary effort is normal.     Breath sounds: Normal air entry. No rhonchi or rales.  Abdominal:     General: Abdomen is flat. Bowel sounds are normal. There is no distension.     Palpations: Abdomen is soft. There is no hepatomegaly, splenomegaly or mass.     Tenderness: There is no abdominal tenderness.     Hernia: A hernia is present. Hernia is present in the right inguinal area (reducible).  Musculoskeletal:        General: Normal range of motion.     Cervical back: Normal range of motion and neck supple.     Right lower leg: No edema.     Left lower leg: No edema.  Skin:    General: Skin is warm and dry.  Neurological:     General: No focal deficit present.     Mental Status: He is alert and oriented to person, place, and time.     Cranial Nerves: No cranial nerve deficit.     Motor: No weakness.  Psychiatric:        Mood and Affect: Mood normal.        Behavior: Behavior normal.      Results for orders placed or performed in visit on 07/25/23  POCT CBG (Fasting - Glucose)  Result Value Ref Range   Glucose Fasting, POC 95 70 - 99 mg/dL    Recent Results (from the past 2160 hours)  Lipid panel     Status: Abnormal   Collection Time: 07/24/23  8:31 AM  Result  Value Ref Range   Cholesterol, Total 165 100 - 199 mg/dL   Triglycerides 409 0 -  149 mg/dL   HDL 40 >40 mg/dL   VLDL Cholesterol Cal 21 5 - 40 mg/dL   LDL Chol Calc (NIH) 981 (H) 0 - 99 mg/dL   Chol/HDL Ratio 4.1 0.0 - 5.0 ratio    Comment:                                   T. Chol/HDL Ratio                                             Men  Women                               1/2 Avg.Risk  3.4    3.3                                   Avg.Risk  5.0    4.4                                2X Avg.Risk  9.6    7.1                                3X Avg.Risk 23.4   11.0   Hepatic function panel     Status: None   Collection Time: 07/24/23  8:31 AM  Result Value Ref Range   Total Protein 6.8 6.0 - 8.5 g/dL   Albumin 4.6 4.1 - 5.1 g/dL   Bilirubin Total 0.7 0.0 - 1.2 mg/dL   Bilirubin, Direct 1.91 0.00 - 0.40 mg/dL   Alkaline Phosphatase 64 44 - 121 IU/L   AST 20 0 - 40 IU/L   ALT 26 0 - 44 IU/L  CK     Status: None   Collection Time: 07/24/23  8:31 AM  Result Value Ref Range   Total CK 234 49 - 439 U/L  Hemoglobin A1c     Status: Abnormal   Collection Time: 07/24/23  8:31 AM  Result Value Ref Range   Hgb A1c MFr Bld 6.2 (H) 4.8 - 5.6 %    Comment:          Prediabetes: 5.7 - 6.4          Diabetes: >6.4          Glycemic control for adults with diabetes: <7.0    Est. average glucose Bld gHb Est-mCnc 131 mg/dL  CBC     Status: Abnormal   Collection Time: 07/24/23  8:54 AM  Result Value Ref Range   WBC 4.8 4.0 - 10.5 K/uL   RBC 5.21 4.22 - 5.81 MIL/uL   Hemoglobin 15.7 13.0 - 17.0 g/dL   HCT 47.8 29.5 - 62.1 %   MCV 91.7 80.0 - 100.0 fL   MCH 30.1 26.0 - 34.0 pg   MCHC 32.8 30.0 - 36.0 g/dL   RDW 30.8 65.7 - 84.6 %   Platelets 148 (L) 150 - 400 K/uL   nRBC 0.0 0.0 - 0.2 %    Comment: Performed at [facility], [address]  [place], [place] [address]  Basic metabolic panel     Status: None   Collection Time: 07/24/23  8:54 AM  Result Value Ref Range   Sodium 140  135 - 145 mmol/L   Potassium 3.8 3.5 - 5.1 mmol/L   Chloride 104 98 - 111 mmol/L   CO2 26 22 - 32 mmol/L   Glucose, Bld 95 70 - 99 mg/dL    Comment: Glucose reference range applies only to samples taken after fasting for at least 8 hours.   BUN 18 6 - 20 mg/dL   Creatinine, Ser 2.13 0.61 - 1.24 mg/dL   Calcium 9.2 8.9 - 08.6 mg/dL   GFR, Estimated >57 >84 mL/min    Comment: (NOTE) Calculated using the CKD-EPI Creatinine Equation (2021)    Anion gap 10 5 - 15    Comment: Performed at [facility], [address] [place], [place] [address]  POCT CBG (Fasting - Glucose)     Status: None   Collection Time: 07/25/23  3:13 PM  Result Value Ref Range   Glucose Fasting, POC 95 70 - 99 mg/dL      Assessment & Plan:  As per problem list  Problem List Items Addressed This Visit       Endocrine   Controlled type 2 diabetes mellitus without complication, without long-term current use of insulin (HCC) - Primary   Relevant Medications   rosuvastatin (CRESTOR) 40 MG tablet   RYBELSUS 7 MG TABS   Other Relevant Orders   POCT CBG (Fasting - Glucose) (Completed)     Other   Mixed hyperlipidemia   Relevant Medications   rosuvastatin (CRESTOR) 40 MG tablet    Return in about 3 months (around 10/25/2023) for fu with labs prior.   Total time spent: 20 minutes  Luna Fuse, MD  07/25/2023   This document may have been prepared by [facility] Voice Recognition software and as such may include unintentional dictation errors.

## 2023-07-28 MED ORDER — CHLORHEXIDINE GLUCONATE CLOTH 2 % EX PADS: 6.0000 | MEDICATED_PAD | Freq: Once | CUTANEOUS | Status: DC

## 2023-07-28 MED ORDER — CELECOXIB 200 MG PO CAPS
200.0000 mg | ORAL_CAPSULE | ORAL | Status: AC
  Administered 2023-07-29: 200 mg via ORAL

## 2023-07-28 MED ORDER — SODIUM CHLORIDE 0.9 % IV SOLN: INTRAVENOUS | Status: DC

## 2023-07-28 MED ORDER — CHLORHEXIDINE GLUCONATE 0.12 % MT SOLN
15.0000 mL | Freq: Once | OROMUCOSAL | Status: AC
  Administered 2023-07-29: 15 mL via OROMUCOSAL

## 2023-07-28 MED ORDER — GABAPENTIN 300 MG PO CAPS
300.0000 mg | ORAL_CAPSULE | ORAL | Status: AC
  Administered 2023-07-29: 300 mg via ORAL

## 2023-07-28 MED ORDER — ACETAMINOPHEN 500 MG PO TABS
1000.0000 mg | ORAL_TABLET | ORAL | Status: AC
  Administered 2023-07-29: 1000 mg via ORAL

## 2023-07-28 MED ORDER — CEFAZOLIN SODIUM-DEXTROSE 2-4 GM/100ML-% IV SOLN
2.0000 g | INTRAVENOUS | Status: AC
Start: 2023-07-29 — End: 2023-07-30
  Administered 2023-07-29: 2 g via INTRAVENOUS

## 2023-07-28 MED ORDER — ORAL CARE MOUTH RINSE: 15.0000 mL | Freq: Once | OROMUCOSAL | Status: AC

## 2023-07-28 MED ORDER — CHLORHEXIDINE GLUCONATE CLOTH 2 % EX PADS
6.0000 | MEDICATED_PAD | Freq: Once | CUTANEOUS | Status: AC
  Administered 2023-07-29: 6 via TOPICAL

## 2023-07-29 ENCOUNTER — Ambulatory Visit: Admitting: Certified Registered"

## 2023-07-29 ENCOUNTER — Ambulatory Visit
Admission: RE | Admit: 2023-07-29 | Discharge: 2023-07-29 | Disposition: A | Discharge status: Home / Self Care | Payer: BC Managed Care – PPO | Source: Ambulatory Visit | Attending: Surgery | Admitting: Surgery

## 2023-07-29 ENCOUNTER — Encounter: Payer: Self-pay | Admitting: Surgery

## 2023-07-29 ENCOUNTER — Ambulatory Visit: Payer: Self-pay | Admitting: Urgent Care

## 2023-07-29 ENCOUNTER — Other Ambulatory Visit: Payer: Self-pay

## 2023-07-29 ENCOUNTER — Encounter
Admission: RE | Disposition: A | Discharge status: Home / Self Care | Payer: Self-pay | Source: Ambulatory Visit | Attending: Surgery

## 2023-07-29 DIAGNOSIS — K4020 Bilateral inguinal hernia, without obstruction or gangrene, not specified as recurrent: Secondary | ICD-10-CM | POA: Diagnosis present

## 2023-07-29 DIAGNOSIS — E119 Type 2 diabetes mellitus without complications: Secondary | ICD-10-CM | POA: Insufficient documentation

## 2023-07-29 DIAGNOSIS — Z7984 Long term (current) use of oral hypoglycemic drugs: Secondary | ICD-10-CM | POA: Diagnosis not present

## 2023-07-29 DIAGNOSIS — K219 Gastro-esophageal reflux disease without esophagitis: Secondary | ICD-10-CM | POA: Insufficient documentation

## 2023-07-29 DIAGNOSIS — Z01818 Encounter for other preprocedural examination: Secondary | ICD-10-CM

## 2023-07-29 HISTORY — PX: INSERTION OF MESH: SHX5868

## 2023-07-29 LAB — GLUCOSE, CAPILLARY: Glucose-Capillary: 98 mg/dL (ref 70–99)

## 2023-07-29 SURGERY — REPAIR, HERNIA, INGUINAL, BILATERAL, ROBOT-ASSISTED
Anesthesia: General | Laterality: Bilateral

## 2023-07-29 MED ORDER — PROPOFOL 10 MG/ML IV BOLUS
INTRAVENOUS | Status: AC
  Filled 2023-07-29: qty 20

## 2023-07-29 MED ORDER — FENTANYL CITRATE (PF) 100 MCG/2ML IJ SOLN
INTRAMUSCULAR | Status: AC
  Filled 2023-07-29: qty 2

## 2023-07-29 MED ORDER — OXYCODONE HCL 5 MG PO TABS
ORAL_TABLET | ORAL | Status: AC
  Filled 2023-07-29: qty 1

## 2023-07-29 MED ORDER — DEXMEDETOMIDINE HCL IN NACL 80 MCG/20ML IV SOLN
INTRAVENOUS | Status: DC | PRN
  Administered 2023-07-29: 4 ug via INTRAVENOUS
  Administered 2023-07-29: 8 ug via INTRAVENOUS

## 2023-07-29 MED ORDER — CHLORHEXIDINE GLUCONATE 0.12 % MT SOLN
OROMUCOSAL | Status: AC
  Filled 2023-07-29: qty 15

## 2023-07-29 MED ORDER — BUPIVACAINE-EPINEPHRINE (PF) 0.25% -1:200000 IJ SOLN
INTRAMUSCULAR | Status: AC
  Filled 2023-07-29: qty 30

## 2023-07-29 MED ORDER — ACETAMINOPHEN 500 MG PO TABS
ORAL_TABLET | ORAL | Status: AC
  Filled 2023-07-29: qty 2

## 2023-07-29 MED ORDER — GABAPENTIN 300 MG PO CAPS
ORAL_CAPSULE | ORAL | Status: AC
  Filled 2023-07-29: qty 1

## 2023-07-29 MED ORDER — BUPIVACAINE-EPINEPHRINE 0.25% -1:200000 IJ SOLN
INTRAMUSCULAR | Status: DC | PRN
  Administered 2023-07-29: 30 mL

## 2023-07-29 MED ORDER — OXYCODONE HCL 5 MG PO TABS
5.0000 mg | ORAL_TABLET | Freq: Once | ORAL | Status: AC | PRN
  Administered 2023-07-29: 5 mg via ORAL

## 2023-07-29 MED ORDER — DEXAMETHASONE SODIUM PHOSPHATE 10 MG/ML IJ SOLN
INTRAMUSCULAR | Status: DC | PRN
  Administered 2023-07-29: 10 mg via INTRAVENOUS

## 2023-07-29 MED ORDER — BUPIVACAINE LIPOSOME 1.3 % IJ SUSP
INTRAMUSCULAR | Status: AC
  Filled 2023-07-29: qty 20

## 2023-07-29 MED ORDER — LIDOCAINE HCL (CARDIAC) PF 100 MG/5ML IV SOSY
PREFILLED_SYRINGE | INTRAVENOUS | Status: DC | PRN
  Administered 2023-07-29: 60 mg via INTRAVENOUS

## 2023-07-29 MED ORDER — BUPIVACAINE LIPOSOME 1.3 % IJ SUSP
INTRAMUSCULAR | Status: DC | PRN
  Administered 2023-07-29: 20 mL

## 2023-07-29 MED ORDER — CEFAZOLIN SODIUM-DEXTROSE 2-4 GM/100ML-% IV SOLN
INTRAVENOUS | Status: AC
  Filled 2023-07-29: qty 100

## 2023-07-29 MED ORDER — FENTANYL CITRATE (PF) 100 MCG/2ML IJ SOLN
INTRAMUSCULAR | Status: AC
Start: 2023-07-29 — End: ?
  Filled 2023-07-29: qty 2

## 2023-07-29 MED ORDER — ONDANSETRON HCL 4 MG/2ML IJ SOLN
INTRAMUSCULAR | Status: DC | PRN
  Administered 2023-07-29: 4 mg via INTRAVENOUS

## 2023-07-29 MED ORDER — OXYCODONE HCL 5 MG/5ML PO SOLN: 5.0000 mg | Freq: Once | ORAL | Status: AC | PRN

## 2023-07-29 MED ORDER — ROCURONIUM BROMIDE 10 MG/ML (PF) SYRINGE
PREFILLED_SYRINGE | INTRAVENOUS | Status: AC
  Filled 2023-07-29: qty 10

## 2023-07-29 MED ORDER — PROPOFOL 10 MG/ML IV BOLUS
INTRAVENOUS | Status: DC | PRN
  Administered 2023-07-29: 200 mg via INTRAVENOUS

## 2023-07-29 MED ORDER — HYDROMORPHONE HCL 1 MG/ML IJ SOLN: 0.2500 mg | INTRAMUSCULAR | Status: DC | PRN

## 2023-07-29 MED ORDER — MIDAZOLAM HCL 2 MG/2ML IJ SOLN
INTRAMUSCULAR | Status: AC
  Filled 2023-07-29: qty 2

## 2023-07-29 MED ORDER — MIDAZOLAM HCL 2 MG/2ML IJ SOLN
INTRAMUSCULAR | Status: DC | PRN
  Administered 2023-07-29: 2 mg via INTRAVENOUS

## 2023-07-29 MED ORDER — ROCURONIUM BROMIDE 100 MG/10ML IV SOLN
INTRAVENOUS | Status: DC | PRN
  Administered 2023-07-29: 50 mg via INTRAVENOUS
  Administered 2023-07-29: 10 mg via INTRAVENOUS
  Administered 2023-07-29: 20 mg via INTRAVENOUS
  Administered 2023-07-29: 10 mg via INTRAVENOUS

## 2023-07-29 MED ORDER — SUGAMMADEX SODIUM 200 MG/2ML IV SOLN
INTRAVENOUS | Status: DC | PRN
  Administered 2023-07-29: 200 mg via INTRAVENOUS

## 2023-07-29 MED ORDER — FENTANYL CITRATE (PF) 100 MCG/2ML IJ SOLN
INTRAMUSCULAR | Status: DC | PRN
Start: 2023-07-29 — End: 2023-07-29
  Administered 2023-07-29 (×4): 50 ug via INTRAVENOUS

## 2023-07-29 MED ORDER — HYDROCODONE-ACETAMINOPHEN 5-325 MG PO TABS: 1.0000 | ORAL_TABLET | ORAL | 0 refills | Status: DC | PRN

## 2023-07-29 MED ORDER — CELECOXIB 200 MG PO CAPS
ORAL_CAPSULE | ORAL | Status: AC
  Filled 2023-07-29: qty 1

## 2023-07-29 SURGICAL SUPPLY — 42 items
CANNULA REDUCER 12-8 DVNC XI (CANNULA) ×2 IMPLANT
COVER TIP SHEARS 8 DVNC (MISCELLANEOUS) ×2 IMPLANT
COVER WAND RF STERILE (DRAPES) ×2 IMPLANT
DERMABOND ADVANCED .7 DNX12 (GAUZE/BANDAGES/DRESSINGS) ×2 IMPLANT
DRAPE ARM DVNC X/XI (DISPOSABLE) ×6 IMPLANT
DRAPE COLUMN DVNC XI (DISPOSABLE) ×2 IMPLANT
ELECT REM PT RETURN 9FT ADLT (ELECTROSURGICAL) ×2 IMPLANT
ELECTRODE REM PT RTRN 9FT ADLT (ELECTROSURGICAL) ×2 IMPLANT
FORCEPS BPLR R/ABLATION 8 DVNC (INSTRUMENTS) ×2 IMPLANT
FORCEPS PROGRASP DVNC XI (FORCEP) ×2 IMPLANT
GLOVE BIO SURGEON STRL SZ7 (GLOVE) ×8 IMPLANT
GOWN STRL REUS W/ TWL LRG LVL3 (GOWN DISPOSABLE) ×8 IMPLANT
IRRIGATION STRYKERFLOW (MISCELLANEOUS) ×2 IMPLANT
IRRIGATOR STRYKERFLOW (MISCELLANEOUS) IMPLANT
IV NS 1000ML BAXH (IV SOLUTION) IMPLANT
KIT PINK PAD W/HEAD ARE REST (MISCELLANEOUS) ×2 IMPLANT
KIT PINK PAD W/HEAD ARM REST (MISCELLANEOUS) ×2 IMPLANT
LABEL OR SOLS (LABEL) ×2 IMPLANT
MANIFOLD NEPTUNE II (INSTRUMENTS) ×2 IMPLANT
MESH 3DMAX 4X6 LT LRG (Mesh General) IMPLANT
MESH 3DMAX 4X6 RT LRG (Mesh General) IMPLANT
NDL DRIVE SUT CUT DVNC (INSTRUMENTS) ×2 IMPLANT
NDL HYPO 22X1.5 SAFETY MO (MISCELLANEOUS) ×2 IMPLANT
NEEDLE DRIVE SUT CUT DVNC (INSTRUMENTS) ×2 IMPLANT
NEEDLE HYPO 22X1.5 SAFETY MO (MISCELLANEOUS) ×2 IMPLANT
OBTURATOR OPTICAL STND 8 DVNC (TROCAR) ×2 IMPLANT
OBTURATOR OPTICALSTD 8 DVNC (TROCAR) ×2 IMPLANT
PACK LAP CHOLECYSTECTOMY (MISCELLANEOUS) ×2 IMPLANT
SCISSORS MNPLR CVD DVNC XI (INSTRUMENTS) ×2 IMPLANT
SEAL UNIV 5-12 XI (MISCELLANEOUS) ×4 IMPLANT
SET TUBE SMOKE EVAC HIGH FLOW (TUBING) ×2 IMPLANT
SOL ELECTROSURG ANTI STICK (MISCELLANEOUS) ×2 IMPLANT
SOLUTION ELECTROSURG ANTI STCK (MISCELLANEOUS) ×2 IMPLANT
SPONGE T-LAP 18X18 ~~LOC~~+RFID (SPONGE) ×2 IMPLANT
SUT MNCRL AB 4-0 PS2 18 (SUTURE) ×2 IMPLANT
SUT STRATA 3-0 SH (SUTURE) ×4 IMPLANT
SUT VIC AB 2-0 SH 27XBRD (SUTURE) IMPLANT
SUT VICRYL 0 UR6 27IN ABS (SUTURE) ×4 IMPLANT
SYR 30ML LL (SYRINGE) ×2 IMPLANT
TAPE TRANSPORE STRL 2 31045 (GAUZE/BANDAGES/DRESSINGS) ×2 IMPLANT
TRAP FLUID SMOKE EVACUATOR (MISCELLANEOUS) ×2 IMPLANT
WATER STERILE IRR 500ML POUR (IV SOLUTION) ×2 IMPLANT

## 2023-07-29 NOTE — Discharge Instructions (Addendum)
 Patient and wife instructed to call/return to the ER if Patient has not urinated by 11:00 pm  Laparoscopic Inguinal Hernia Repair, Adult, Care After The following information offers guidance on how to care for yourself after your procedure. Your health care provider may also give you more specific instructions. If you have problems or questions, contact your health care provider. What can I expect after the procedure? After the procedure, it is common to have: Pain. Swelling and bruising around the incision area. Scrotal swelling, in males. Some fluid or blood draining from your incisions. Follow these instructions at home: Medicines Take over-the-counter and prescription medicines only as told by your health care provider. Ask your health care provider if the medicine prescribed to you: Requires you to avoid driving or using machinery. Can cause constipation. You may need to take these actions to prevent or treat constipation: Drink enough fluid to keep your urine pale yellow. Take over-the-counter or prescription medicines. Eat foods that are high in fiber, such as beans, whole grains, and fresh fruits and vegetables. Limit foods that are high in fat and processed sugars, such as fried or sweet foods. Incision care  Follow instructions from your health care provider about how to take care of your incisions. Make sure you: Wash your hands with soap and water for at least 20 seconds before and after you change your bandage (dressing). If soap and water are not available, use hand sanitizer. Change your dressing as told by your health care provider. Leave stitches (sutures), skin glue, or adhesive strips in place. These skin closures may need to stay in place for 2 weeks or longer. If adhesive strip edges start to loosen and curl up, you may trim the loose edges. Do not remove adhesive strips completely unless your health care provider tells you to do that. Check your incision area every day  for signs of infection. Check for: More redness, swelling, or pain. More fluid or blood. Warmth. Pus or a bad smell. Wear loose, soft clothing while your incisions heal. Managing pain and swelling If directed, put ice on the painful or swollen areas. To do this: Put ice in a plastic bag. Place a towel between your skin and the bag. Leave the ice on for 20 minutes, 2-3 times a day. Remove the ice if your skin turns bright red. This is very important. If you cannot feel pain, heat, or cold, you have a greater risk of damage to the area.   Activity Do not lift anything that is heavier than 10 lb (4.5 kg), or the limit that you are told, until your health care provider says that it is safe. Ask your health care provider what activities are safe for you. A lot of activity during the first week after surgery can increase pain and swelling. For 1 week after your procedure: Avoid activities that take a lot of effort, such as exercise or sports. You may walk and climb stairs as needed for daily activity, but avoid long walks or climbing stairs for exercise. General instructions If you were given a sedative during the procedure, it can affect you for several hours. Do not drive or operate machinery until your health care provider says that it is safe. Do not take baths, swim, or use a hot tub until your health care provider approves. Ask your health care provider if you may take showers. You may only be allowed to take sponge baths. Do not use any products that contain nicotine or tobacco. These  products include cigarettes, chewing tobacco, and vaping devices, such as e-cigarettes. If you need help quitting, ask your health care provider. Keep all follow-up visits. This is important. Contact a health care provider if: You have any of these signs of infection: More redness, swelling, or pain around your incisions or your groin area. More fluid or blood coming from an incision. Warmth coming from an  incision. Pus or a bad smell coming from an incision. A fever or chills. You have more swelling in your scrotum, if you are male. You have severe pain and medicines do not help. You have abdominal pain or swelling. You cannot urinate or have a bowel movement. You faint or feel dizzy. You have nausea and vomiting. Get help right away if: You have redness, warmth, or pain in your leg. You have chest pain. You have problems breathing. These symptoms may represent a serious problem that is an emergency. Do not wait to see if the symptoms will go away. Get medical help right away. Call your local emergency services (911 in the [place] Do not drive yourself to the hospital. Summary Pain, swelling, and bruising are common after the procedure. Check your incision area every day for signs of infection, such as more redness, swelling, or pain. Put ice on painful or swollen areas for 20 minutes, 2-3 times a day. This information is not intended to replace advice given to you by your health care provider. Make sure you discuss any questions you have with your health care provider. Document Revised: 01/04/2020 Document Reviewed: 01/04/2020 Elsevier Patient Education  2024 [employer]  Information for Discharge Teaching: EXPAREL (bupivacaine liposome injectable suspension)   Pain relief is important to your recovery. The goal is to control your pain so you can move easier and return to your normal activities as soon as possible after your procedure. Your physician may use several types of medicines to manage pain, swelling, and more.  Your surgeon or anesthesiologist gave you EXPAREL(bupivacaine) to help control your pain after surgery.  EXPAREL is a local anesthetic designed to release slowly over an extended period of time to provide pain relief by numbing the tissue around the surgical site. EXPAREL is designed to release pain medication over time and can control pain for up to 72  hours. Depending on how you respond to EXPAREL, you may require less pain medication during your recovery. EXPAREL can help reduce or eliminate the need for opioids during the first few days after surgery when pain relief is needed the most. EXPAREL is not an opioid and is not addictive. It does not cause sleepiness or sedation.   Important! A teal colored band has been placed on your arm with the date, time and amount of EXPAREL you have received. Please leave this armband in place for the full 96 hours following administration, and then you may remove the band. If you return to the hospital for any reason within 96 hours following the administration of EXPAREL, the armband provides important information that your health care providers to know, and alerts them that you have received this anesthetic.    Possible side effects of EXPAREL: Temporary loss of sensation or ability to move in the area where medication was injected. Nausea, vomiting, constipation Rarely, numbness and tingling in your mouth or lips, lightheadedness, or anxiety may occur. Call your doctor right away if you think you may be experiencing any of these sensations, or if you have other questions regarding possible side effects.  Follow all  other discharge instructions given to you by your surgeon or nurse. Eat a healthy diet and drink plenty of water or other fluids.

## 2023-07-29 NOTE — Anesthesia Procedure Notes (Signed)
 Procedure Name: Intubation Date/Time: 07/29/2023 12:49 PM  Performed by: Lanell Matar, CRNAPre-anesthesia Checklist: Patient identified, Emergency Drugs available, Suction available and Patient being monitored Patient Re-evaluated:Patient Re-evaluated prior to induction Oxygen Delivery Method: Circle System Utilized Preoxygenation: Pre-oxygenation with 100% oxygen Induction Type: IV induction Ventilation: Mask ventilation without difficulty Laryngoscope Size: McGrath and 4 Grade View: Grade II Tube type: Oral Tube size: 7.0 mm Number of attempts: 1 Airway Equipment and Method: Stylet and Oral airway Placement Confirmation: ETT inserted through vocal cords under direct vision, positive ETCO2 and breath sounds checked- equal and bilateral Secured at: 23 cm Tube secured with: Tape Dental Injury: Teeth and Oropharynx as per pre-operative assessment

## 2023-07-29 NOTE — Interval H&P Note (Signed)
 History and Physical Interval Note:  07/29/2023 12:27 PM  Brett [name]  has presented today for surgery, with the diagnosis of bilateral inguinal hernia.  The various methods of treatment have been discussed with the patient and family. After consideration of risks, benefits and other options for treatment, the patient has consented to  Procedure(s): REPAIR, HERNIA, INGUINAL, BILATERAL, ROBOT-ASSISTED (Bilateral) as a surgical intervention.  The patient's history has been reviewed, patient examined, no change in status, stable for surgery.  I have reviewed the patient's chart and labs.  Questions were answered to the patient's satisfaction.     [name]

## 2023-07-29 NOTE — Transfer of Care (Signed)
 Immediate Anesthesia Transfer of Care Note  Patient: Brett [name]  Procedure(s) Performed: REPAIR, HERNIA, INGUINAL, BILATERAL, ROBOT-ASSISTED (Bilateral) INSERTION OF MESH  Patient Location: PACU  Anesthesia Type:General  Level of Consciousness: awake, drowsy, and patient cooperative  Airway & Oxygen Therapy: Patient Spontanous Breathing and Patient connected to face mask oxygen  Post-op Assessment: Report given to RN, Post -op Vital signs reviewed and stable, and Patient moving all extremities X 4  Post vital signs: Reviewed and stable  Last Vitals:  Vitals Value Taken Time  BP 113/75 07/29/23 1453  Temp 36.2 C 07/29/23 1455  Pulse 81 07/29/23 1457  Resp 12 07/29/23 1457  SpO2 100 % 07/29/23 1457  Vitals shown include unfiled device data.  Last Pain:  Vitals:   07/29/23 1044  TempSrc: Oral  PainSc: 0-No pain         Complications: No notable events documented.

## 2023-07-29 NOTE — Op Note (Signed)
 Robotic assisted Laparoscopic Transabdominal  Bilateral Inguinal Hernia Repair with 3 D large Mesh      Pre-operative Diagnosis:  Bilateral Inguinal Hernias   Post-operative Diagnosis: Same   Procedure: Robotic  Laparoscopic  repair of Bilateral inguinal hernias   Surgeon: Sterling Big, MD FACS   Anesthesia: Gen. with endotracheal tube   Findings: Bilateral Indirect inguinal hernias        Procedure Details  The patient was seen again in the Holding Room. The benefits, complications, treatment options, and expected outcomes were discussed with the patient. The risks of bleeding, infection, recurrence of symptoms, failure to resolve symptoms, recurrence of hernia, ischemic orchitis, chronic pain syndrome or neuroma, were discussed again. The likelihood of improving the patient's symptoms with return to their baseline status is good.  The patient and/or family concurred with the proposed plan, giving informed consent.  The patient was taken to Operating Room, identified  and the procedure verified as Laparoscopic Inguinal Hernia Repair. Laterality confirmed.  A Time Out was held and the above information confirmed.   Prior to the induction of general anesthesia, antibiotic prophylaxis was administered. VTE prophylaxis was in place. General endotracheal anesthesia was then administered and tolerated well. After the induction, the abdomen was prepped with Chloraprep and draped in the sterile fashion. The patient was positioned in the supine position.     Supraumbilical incision created and cut down technique used to enter the abdominal cavity. Fascia elevated and incised and hasson trochar placed. Pneumoperitoneum obtained w/o HD changes. No evidence of bowel injuries.  Two 8 mm placed under direct vision. The laparoscopy revealed large indirect defect on the Right and medium size on the Left. I inserted the needles and the mesh. The robot was brought ot the table and docked in the standard  fashion, no collision between arms was observed. Instruments were kept under direct view at all times. We started on the right side were a flap was created. The sac was reduced and dissected free from adjacent structures. We preserved the vas and the vessels. Once dissection was completed a large 3D mesh was placed and secured with two interrupted vicryl attached to the pubic tubercle. There was good coverage of the direct, indirect and femoral spaces. The flap was closed with v lock suture.   Attention then was turned to the left side were a flap was created. The sac was reduced and dissected free from adjacent structures. We preserved the vas and the vessels. Once dissection was completed a large 3D mesh was placed and secured with two interrupted vicryl attached to the pubic tubercle. There was good coverage of the direct, indirect and femoral spaces. The flap was closed with v lock suture. Second look revealed no complications or injuries.     Once assuring that hemostasis was adequate the ports were removed and a figure-of-eight 0 Vicryl suture was placed at the fascial edges. 4-0 subcuticular Monocryl was used at all skin edges. Dermabond was placed.  Patient tolerated the procedure well. There were no complications. He was taken to the recovery room in stable condition.                 Sterling Big, MD, FACS

## 2023-07-30 ENCOUNTER — Telehealth: Payer: Self-pay | Admitting: Internal Medicine

## 2023-07-30 ENCOUNTER — Encounter: Payer: Self-pay | Admitting: Surgery

## 2023-07-30 NOTE — Telephone Encounter (Signed)
 PT is out of his PANTOPRAZOLE 40 MG [facility] [address] is waiting on doc approval

## 2023-07-30 NOTE — Anesthesia Postprocedure Evaluation (Signed)
 Anesthesia Post Note  Patient: Brett [name]  Procedure(s) Performed: REPAIR, HERNIA, INGUINAL, BILATERAL, ROBOT-ASSISTED (Bilateral) INSERTION OF MESH  Patient location during evaluation: PACU Anesthesia Type: General Level of consciousness: awake and alert Pain management: pain level controlled Vital Signs Assessment: post-procedure vital signs reviewed and stable Respiratory status: spontaneous breathing, nonlabored ventilation, respiratory function stable and patient connected to nasal cannula oxygen Cardiovascular status: blood pressure returned to baseline and stable Postop Assessment: no apparent nausea or vomiting Anesthetic complications: no   No notable events documented.   Last Vitals:  Vitals:   07/29/23 1600 07/29/23 1632  BP: 126/82 130/78  Pulse: 80 88  Resp: 12 14  Temp:  37.2 C  SpO2: 95% 97%    Last Pain:  Vitals:   07/29/23 1632  TempSrc: Temporal  PainSc: 2                  Corinda Gubler

## 2023-07-30 NOTE — Anesthesia Preprocedure Evaluation (Signed)
 Anesthesia Evaluation  Patient identified by MRN, date of birth, ID band Patient awake    Reviewed: Allergy & Precautions, NPO status , Patient's Chart, lab work & pertinent test results  History of Anesthesia Complications Negative for: history of anesthetic complications  Airway Mallampati: II  TM Distance: >3 FB Neck ROM: Full    Dental no notable dental hx. (+) Teeth Intact   Pulmonary neg pulmonary ROS, neg sleep apnea, neg COPD, Patient abstained from smoking.Not current smoker, former smoker   Pulmonary exam normal breath sounds clear to auscultation       Cardiovascular Exercise Tolerance: Good METS(-) hypertension(-) CAD and (-) Past MI negative cardio ROS (-) dysrhythmias  Rhythm:Regular Rate:Normal - Systolic murmurs    Neuro/Psych negative neurological ROS  negative psych ROS   GI/Hepatic ,GERD  Controlled,,(+)     (-) substance abuse    Endo/Other  diabetes  On Rybelsus, held for more than a day. Denies GI symptoms  Renal/GU negative Renal ROS     Musculoskeletal   Abdominal   Peds  Hematology   Anesthesia Other Findings Past Medical History: No date: Acid reflux No date: Bilateral inguinal hernia (BIH) No date: DM (diabetes mellitus), type 2 (HCC) No date: Former smoker 2019: History of pneumonia No date: Mixed hyperlipidemia No date: Speech impediment  Reproductive/Obstetrics                             Anesthesia Physical Anesthesia Plan  ASA: 2  Anesthesia Plan: General   Post-op Pain Management:    Induction: Intravenous  PONV Risk Score and Plan: 2 and Ondansetron and Dexamethasone  Airway Management Planned: Oral ETT  Additional Equipment: None  Intra-op Plan:   Post-operative Plan: Extubation in OR  Informed Consent: I have reviewed the patients History and Physical, chart, labs and discussed the procedure including the risks, benefits and  alternatives for the proposed anesthesia with the patient or authorized representative who has indicated his/her understanding and acceptance.     Dental advisory given  Plan Discussed with: CRNA and Surgeon  Anesthesia Plan Comments: (Discussed risks of anesthesia with patient, including PONV, sore throat, lip/dental/eye damage. Rare risks discussed as well, such as cardiorespiratory and neurological sequelae, and allergic reactions. Discussed the role of CRNA in patient's perioperative care. Patient understands.)       Anesthesia Quick Evaluation

## 2023-07-31 ENCOUNTER — Telehealth: Payer: Self-pay | Admitting: *Deleted

## 2023-07-31 NOTE — Telephone Encounter (Signed)
 Unable to LVM, please let patient know that his FMLA paperwork is up front and ready to be picked up

## 2023-08-11 ENCOUNTER — Encounter: Payer: Self-pay | Admitting: Surgery

## 2023-08-13 ENCOUNTER — Ambulatory Visit (INDEPENDENT_AMBULATORY_CARE_PROVIDER_SITE_OTHER): Admitting: Surgery

## 2023-08-13 ENCOUNTER — Encounter: Payer: Self-pay | Admitting: Surgery

## 2023-08-13 VITALS — BP 121/80 | HR 83 | Temp 98.5°F | Ht 72.0 in | Wt 202.6 lb

## 2023-08-13 DIAGNOSIS — Z09 Encounter for follow-up examination after completed treatment for conditions other than malignant neoplasm: Secondary | ICD-10-CM

## 2023-08-13 DIAGNOSIS — K4020 Bilateral inguinal hernia, without obstruction or gangrene, not specified as recurrent: Secondary | ICD-10-CM

## 2023-08-13 NOTE — Patient Instructions (Signed)

## 2023-08-14 NOTE — Progress Notes (Signed)
 Brett [name] is 2 weeks out from bilateral inguinal hernia repair.  No issues.  Tolerating diet ambulating.  he is very happy w outcomes  PE NAD Abd: soft, nt, incisions c/d/I , no recurrence   A/P Doing very well w/o complications Continue lifting restrictions RTC prn

## 2023-09-02 ENCOUNTER — Encounter: Payer: Self-pay | Admitting: Cardiology

## 2023-09-02 ENCOUNTER — Ambulatory Visit: Admitting: Cardiology

## 2023-09-02 VITALS — BP 120/86 | HR 83 | Ht 72.0 in | Wt 207.4 lb

## 2023-09-02 DIAGNOSIS — H6123 Impacted cerumen, bilateral: Secondary | ICD-10-CM

## 2023-09-02 DIAGNOSIS — H9201 Otalgia, right ear: Secondary | ICD-10-CM

## 2023-09-02 DIAGNOSIS — Z0130 Encounter for examination of blood pressure without abnormal findings: Secondary | ICD-10-CM

## 2023-09-02 DIAGNOSIS — R7303 Prediabetes: Secondary | ICD-10-CM

## 2023-09-02 MED ORDER — DEBROX 6.5 % OT SOLN: 5.0000 [drp] | Freq: Two times a day (BID) | OTIC | 4 refills | Status: DC

## 2023-09-02 NOTE — Progress Notes (Signed)
 Established Patient Office Visit  Subjective:  Patient ID: Brett [name], male    DOB: 01-Aug-1975  Age: 48 y.o. MRN: [id number]  Chief Complaint  Patient presents with   Acute Visit    R ear pain x 3 days     Patient in office for an acute visit, complaining of right ear pain x3 days. Patient states throat started hurting this morning. Denies rhinorrhea, PND, cough. Bilateral cerumen impaction on exam, right worse than left. Will perform ear irrigation bilaterally. Debrox ear drops for future use.   Otalgia  There is pain in the right ear. This is a new problem. The current episode started in the past 7 days. The problem occurs constantly. The problem has been unchanged. There has been no fever. Associated symptoms include a sore throat. Pertinent negatives include no abdominal pain, coughing, diarrhea, headaches or rhinorrhea. He has tried acetaminophen and NSAIDs for the symptoms. The treatment provided no relief.    No other concerns at this time.   Past Medical History:  Diagnosis Date   Acid reflux    Bilateral inguinal hernia (BIH)    DM (diabetes mellitus), type 2 (HCC)    Former smoker    History of pneumonia 2019   Mixed hyperlipidemia    Speech impediment     Past Surgical History:  Procedure Laterality Date   INSERTION OF MESH  07/29/2023   Procedure: INSERTION OF MESH;  Surgeon: Leafy Ro, MD;  Location: [facility] ORS;  Service: General;;   TONSILLECTOMY Bilateral    as a child    Social History   Socioeconomic History   Marital status: Married    Spouse name: Not on file   Number of children: Not on file   Years of education: Not on file   Highest education level: Not on file  Occupational History   Not on file  Tobacco Use   Smoking status: Former    Current packs/day: 0.00    Types: Cigars, Cigarettes    Quit date: 11/2015    Years since quitting: 7.7    Passive exposure: Past   Smokeless tobacco: Never   Tobacco comments:    smokes 1-2 cigars  daily for 0.5 year--07/03/2021  Vaping Use   Vaping status: Never Used  Substance and Sexual Activity   Alcohol use: Yes    Comment: occasionally    Drug use: Not Currently    Types: Marijuana    Comment: quit 2017   Sexual activity: Not on file  Other Topics Concern   Not on file  Social History Narrative   Not on file   Social Drivers of Health   Financial Resource Strain: Not on file  Food Insecurity: Not on file  Transportation Needs: Not on file  Physical Activity: Not on file  Stress: Not on file  Social Connections: Not on file  Intimate Partner Violence: Not on file    Family History  Problem Relation Age of Onset   Healthy Mother    Pneumonia Father    Diabetes Maternal Grandmother     Allergies  Allergen Reactions   Penicillins Hives    Outpatient Medications Prior to Visit  Medication Sig   glucose blood (ONETOUCH ULTRA TEST) test strip Use with device to check sugars 2-3 times daily   OneTouch UltraSoft 2 Lancets MISC 1 Units by Does not apply route 2 (two) times daily.   pantoprazole (PROTONIX) 40 MG tablet Take 1 tablet (40 mg total) by mouth every morning.  rosuvastatin (CRESTOR) 40 MG tablet Take 1 tablet (40 mg total) by mouth at bedtime.   RYBELSUS 7 MG TABS Take 1 tablet (7 mg total) by mouth daily.   No facility-administered medications prior to visit.    Review of Systems  Constitutional: Negative.   HENT:  Positive for ear pain and sore throat. Negative for rhinorrhea.   Eyes: Negative.   Respiratory: Negative.  Negative for cough and shortness of breath.   Cardiovascular: Negative.  Negative for chest pain.  Gastrointestinal: Negative.  Negative for abdominal pain, constipation and diarrhea.  Genitourinary: Negative.   Musculoskeletal:  Negative for joint pain and myalgias.  Skin: Negative.   Neurological: Negative.  Negative for dizziness and headaches.  Endo/Heme/Allergies: Negative.   All other systems reviewed and are  negative.      Objective:   BP 120/86   Pulse 83   Ht 6' (1.829 m)   Wt 207 lb 6.4 oz (94.1 kg)   SpO2 96%   BMI 28.13 kg/m   Vitals:   09/02/23 0935  BP: 120/86  Pulse: 83  Height: 6' (1.829 m)  Weight: 207 lb 6.4 oz (94.1 kg)  SpO2: 96%  BMI (Calculated): 28.12    Physical Exam Nursing note reviewed.  Constitutional:      Appearance: Normal appearance. He is normal weight.  HENT:     Head: Normocephalic and atraumatic.     Nose: Nose normal.     Mouth/Throat:     Mouth: Mucous membranes are moist.     Pharynx: Oropharynx is clear.  Eyes:     Extraocular Movements: Extraocular movements intact.     Conjunctiva/sclera: Conjunctivae normal.     Pupils: Pupils are equal, round, and reactive to light.  Cardiovascular:     Rate and Rhythm: Normal rate and regular rhythm.     Pulses: Normal pulses.     Heart sounds: Normal heart sounds.  Pulmonary:     Effort: Pulmonary effort is normal.     Breath sounds: Normal breath sounds.  Abdominal:     General: Abdomen is flat. Bowel sounds are normal.     Palpations: Abdomen is soft.  Musculoskeletal:        General: Normal range of motion.     Cervical back: Normal range of motion.  Skin:    General: Skin is warm and dry.  Neurological:     General: No focal deficit present.     Mental Status: He is alert and oriented to person, place, and time.  Psychiatric:        Mood and Affect: Mood normal.        Behavior: Behavior normal.        Thought Content: Thought content normal.        Judgment: Judgment normal.      No results found for any visits on 09/02/23.  Recent Results (from the past 2160 hours)  Lipid panel     Status: Abnormal   Collection Time: 07/24/23  8:31 AM  Result Value Ref Range   Cholesterol, Total 165 100 - 199 mg/dL   Triglycerides 956 0 - 149 mg/dL   HDL 40 >21 mg/dL   VLDL Cholesterol Cal 21 5 - 40 mg/dL   LDL Chol Calc (NIH) 308 (H) 0 - 99 mg/dL   Chol/HDL Ratio 4.1 0.0 - 5.0 ratio     Comment:  T. Chol/HDL Ratio                                             Men  Women                               1/2 Avg.Risk  3.4    3.3                                   Avg.Risk  5.0    4.4                                2X Avg.Risk  9.6    7.1                                3X Avg.Risk 23.4   11.0   Hepatic function panel     Status: None   Collection Time: 07/24/23  8:31 AM  Result Value Ref Range   Total Protein 6.8 6.0 - 8.5 g/dL   Albumin 4.6 4.1 - 5.1 g/dL   Bilirubin Total 0.7 0.0 - 1.2 mg/dL   Bilirubin, Direct 3.87 0.00 - 0.40 mg/dL   Alkaline Phosphatase 64 44 - 121 IU/L   AST 20 0 - 40 IU/L   ALT 26 0 - 44 IU/L  CK     Status: None   Collection Time: 07/24/23  8:31 AM  Result Value Ref Range   Total CK 234 49 - 439 U/L  Hemoglobin A1c     Status: Abnormal   Collection Time: 07/24/23  8:31 AM  Result Value Ref Range   Hgb A1c MFr Bld 6.2 (H) 4.8 - 5.6 %    Comment:          Prediabetes: 5.7 - 6.4          Diabetes: >6.4          Glycemic control for adults with diabetes: <7.0    Est. average glucose Bld gHb Est-mCnc 131 mg/dL  CBC     Status: Abnormal   Collection Time: 07/24/23  8:54 AM  Result Value Ref Range   WBC 4.8 4.0 - 10.5 K/uL   RBC 5.21 4.22 - 5.81 MIL/uL   Hemoglobin 15.7 13.0 - 17.0 g/dL   HCT 56.4 33.2 - 95.1 %   MCV 91.7 80.0 - 100.0 fL   MCH 30.1 26.0 - 34.0 pg   MCHC 32.8 30.0 - 36.0 g/dL   RDW 88.4 16.6 - 06.3 %   Platelets 148 (L) 150 - 400 K/uL   nRBC 0.0 0.0 - 0.2 %    Comment: Performed at [facility], [address] [place], [place] [address]  Basic metabolic panel     Status: None   Collection Time: 07/24/23  8:54 AM  Result Value Ref Range   Sodium 140 135 - 145 mmol/L   Potassium 3.8 3.5 - 5.1 mmol/L   Chloride 104 98 - 111 mmol/L   CO2 26 22 - 32 mmol/L   Glucose, Bld 95 70 - 99 mg/dL    Comment: Glucose reference range applies only to  samples taken after fasting for at least 8  hours.   BUN 18 6 - 20 mg/dL   Creatinine, Ser 7.25 0.61 - 1.24 mg/dL   Calcium 9.2 8.9 - 36.6 mg/dL   GFR, Estimated >44 >03 mL/min    Comment: (NOTE) Calculated using the CKD-EPI Creatinine Equation (2021)    Anion gap 10 5 - 15    Comment: Performed at [facility], [address] [place], [place] [address]  POCT CBG (Fasting - Glucose)     Status: None   Collection Time: 07/25/23  3:13 PM  Result Value Ref Range   Glucose Fasting, POC 95 70 - 99 mg/dL  POC CREATINE & ALBUMIN,URINE     Status: Normal   Collection Time: 07/25/23  4:45 PM  Result Value Ref Range   Microalbumin Ur, POC 80 mg/L   Creatinine, POC 300 mg/dL   Albumin/Creatinine Ratio, Urine, POC <30   Glucose, capillary     Status: None   Collection Time: 07/29/23 10:40 AM  Result Value Ref Range   Glucose-Capillary 98 70 - 99 mg/dL    Comment: Glucose reference range applies only to samples taken after fasting for at least 8 hours.      Assessment & Plan:  Bilateral ear irrigation Debrox ear drops  Problem List Items Addressed This Visit       Nervous and Auditory   Bilateral impacted cerumen - Primary    Return if symptoms worsen or fail to improve.   Total time spent: 25 minutes  [employer], NP  09/02/2023   This document may have been prepared by Dragon Voice Recognition software and as such may include unintentional dictation errors.

## 2023-10-02 IMAGING — CR DG CHEST 2V
2 series · 2 of 2 positions shown · non-contrast
Comparison: Chest radiograph 03/26/2021

CLINICAL DATA: Cough, shortness of breath. Patient reports dry
cough for 1 month.

EXAM:
CHEST - 2 VIEW

[chest pa]
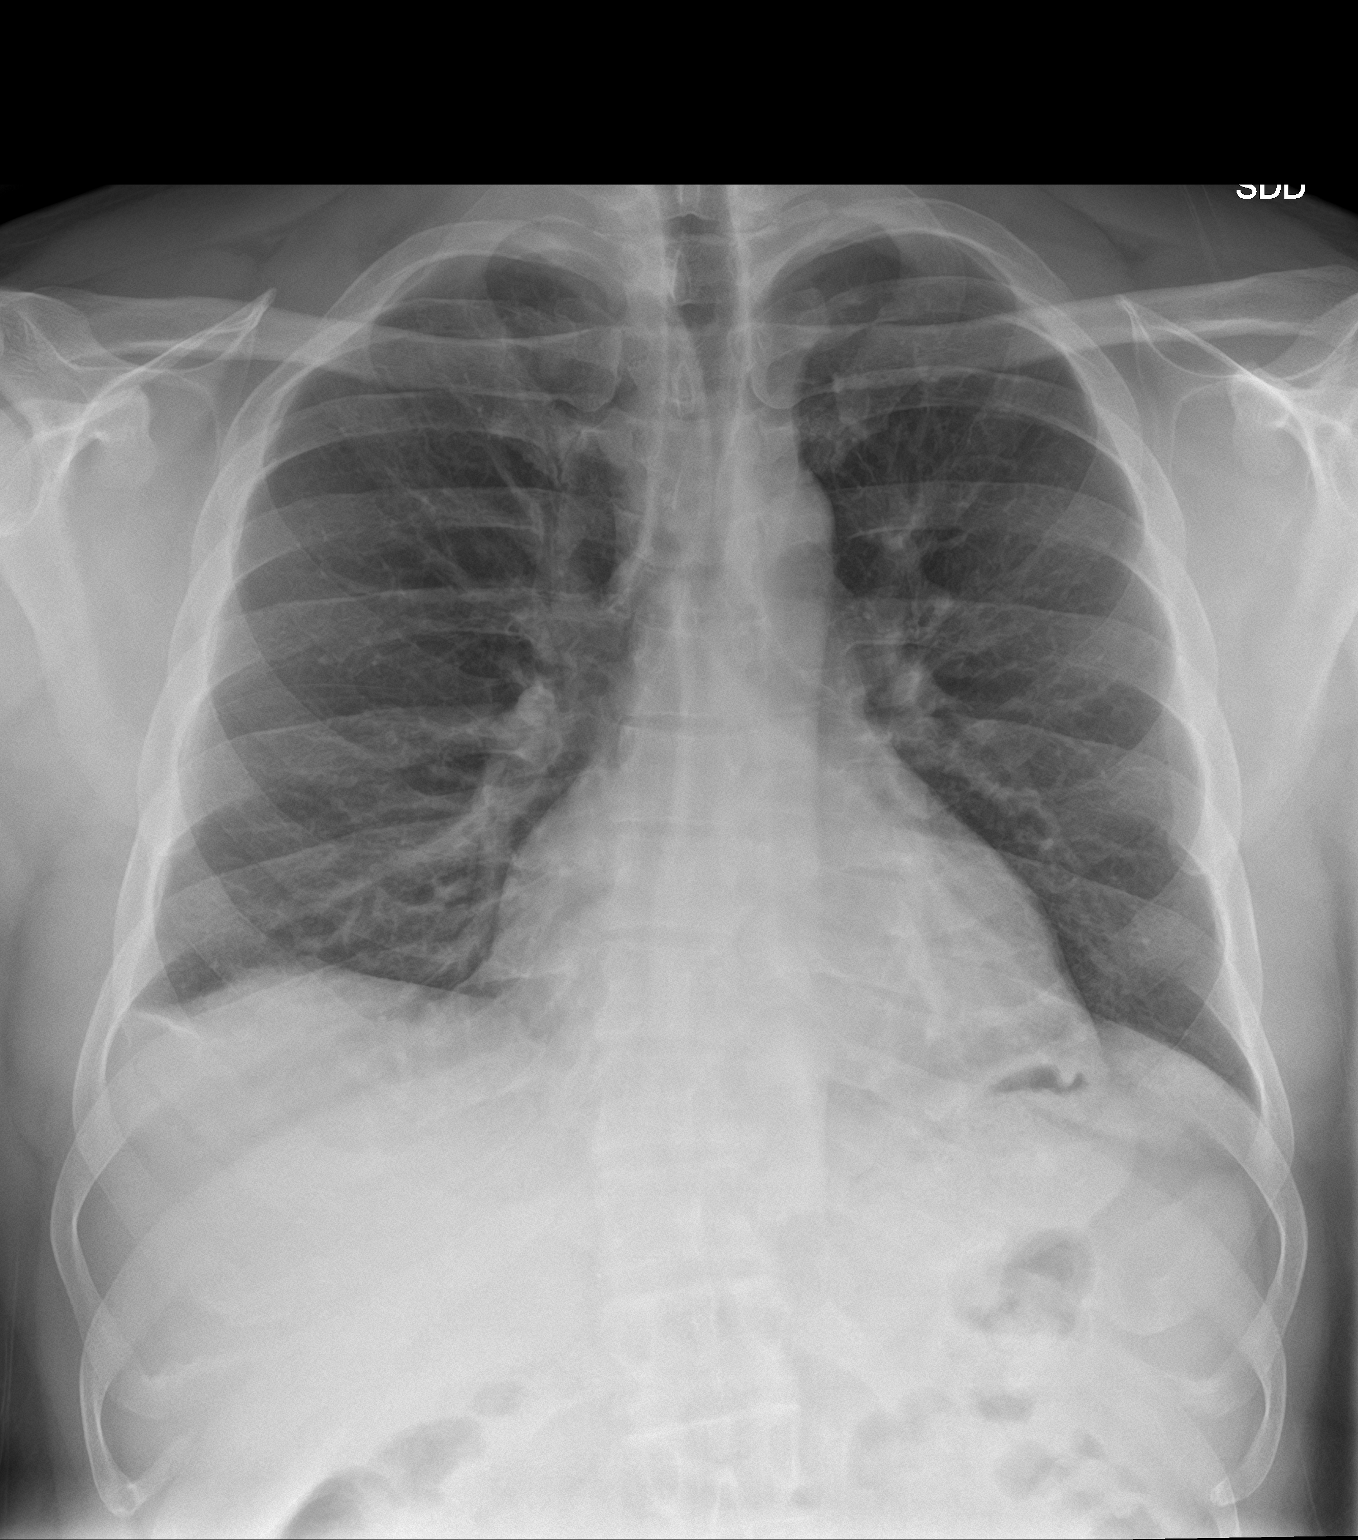

[chest lat]
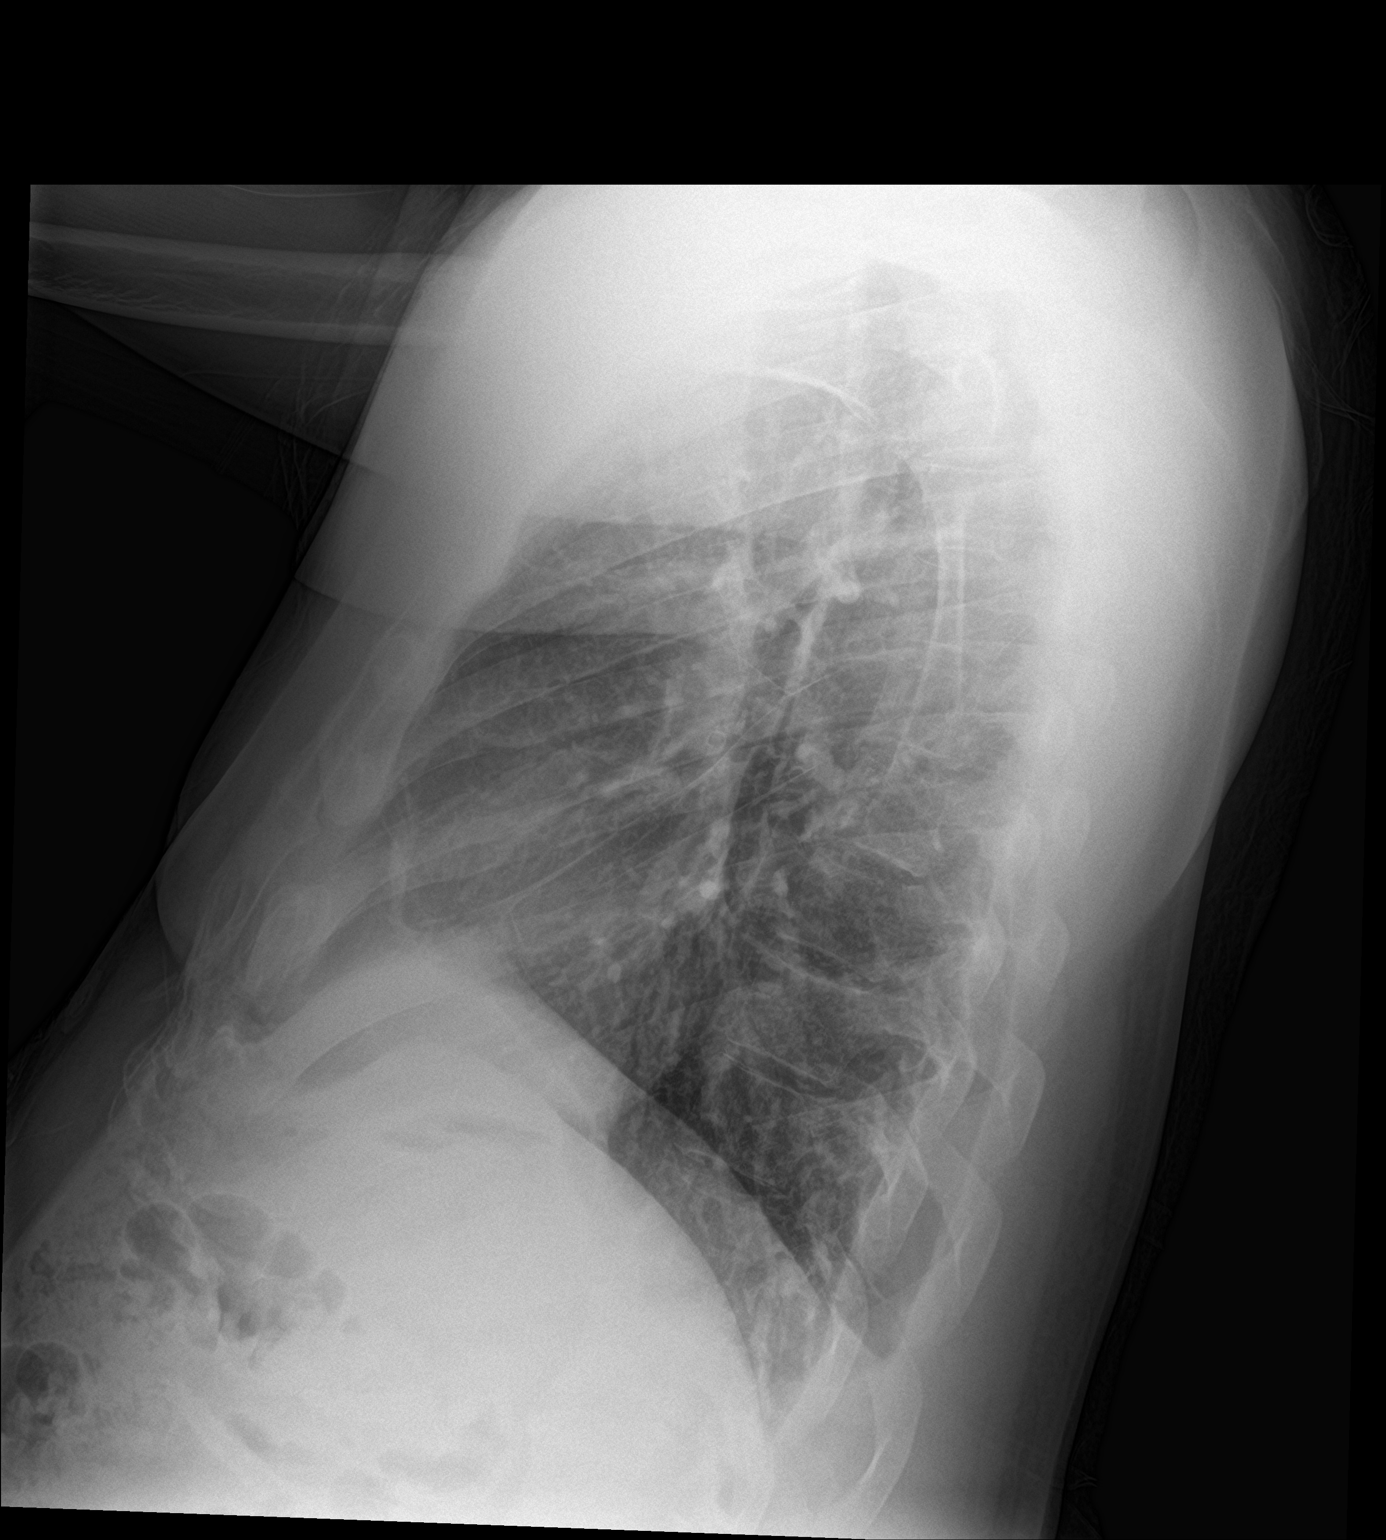

[2 of 2 positions shown; findings below may reference images not displayed]

FINDINGS: The heart size and mediastinal contours are within normal limits.
Both lungs are clear. Thin subsegmental atelectasis versus scarring
at the right costophrenic angle. No pleural effusion or
pneumothorax. The visualized skeletal structures are unremarkable.
IMPRESSION: No acute cardiopulmonary abnormality.

## 2023-10-31 ENCOUNTER — Ambulatory Visit: Admitting: Internal Medicine

## 2023-12-26 ENCOUNTER — Other Ambulatory Visit

## 2023-12-27 LAB — LIPID PANEL
Chol/HDL Ratio: 5.3 ratio — ABNORMAL HIGH (ref 0.0–5.0)
Cholesterol, Total: 213 mg/dL — ABNORMAL HIGH (ref 100–199)
HDL: 40 mg/dL (ref >39)
LDL Chol Calc (NIH): 154 mg/dL — ABNORMAL HIGH (ref 0–99)
Triglycerides: 106 mg/dL (ref 0–149)
VLDL Cholesterol Cal: 19 mg/dL (ref 5–40)

## 2023-12-27 LAB — HEMOGLOBIN A1C
Est. average glucose Bld gHb Est-mCnc: 131 mg/dL
Hgb A1c MFr Bld: 6.2 % — ABNORMAL HIGH (ref 4.8–5.6)

## 2023-12-30 ENCOUNTER — Ambulatory Visit: Admitting: Internal Medicine

## 2023-12-30 ENCOUNTER — Encounter: Payer: Self-pay | Admitting: Internal Medicine

## 2023-12-30 VITALS — BP 118/82 | HR 84 | Temp 96.1°F | Ht 72.0 in | Wt 206.8 lb

## 2023-12-30 LAB — GLUCOSE, POCT (MANUAL RESULT ENTRY): POC Glucose: 100 mg/dL — AB (ref 70–99)

## 2024-01-05 ENCOUNTER — Ambulatory Visit: Admitting: Internal Medicine

## 2024-01-13 ENCOUNTER — Ambulatory Visit: Admitting: Internal Medicine

## 2024-02-03 ENCOUNTER — Ambulatory Visit: Payer: Self-pay | Admitting: Internal Medicine

## 2024-02-03 ENCOUNTER — Ambulatory Visit: Admitting: Internal Medicine

## 2024-02-03 VITALS — BP 108/80 | HR 84 | Temp 98.4°F | Ht 72.0 in | Wt 203.2 lb

## 2024-02-03 DIAGNOSIS — K219 Gastro-esophageal reflux disease without esophagitis: Secondary | ICD-10-CM

## 2024-02-03 DIAGNOSIS — R251 Tremor, unspecified: Secondary | ICD-10-CM

## 2024-02-03 DIAGNOSIS — M25561 Pain in right knee: Secondary | ICD-10-CM

## 2024-02-03 DIAGNOSIS — E119 Type 2 diabetes mellitus without complications: Secondary | ICD-10-CM | POA: Diagnosis not present

## 2024-02-03 DIAGNOSIS — Z0130 Encounter for examination of blood pressure without abnormal findings: Secondary | ICD-10-CM

## 2024-02-03 DIAGNOSIS — E782 Mixed hyperlipidemia: Secondary | ICD-10-CM

## 2024-02-03 DIAGNOSIS — N521 Erectile dysfunction due to diseases classified elsewhere: Secondary | ICD-10-CM

## 2024-02-03 LAB — POCT CBG (FASTING - GLUCOSE)-MANUAL ENTRY: Glucose Fasting, POC: 93 mg/dL (ref 70–99)

## 2024-02-03 MED ORDER — PANTOPRAZOLE SODIUM 40 MG PO TBEC: 40.0000 mg | DELAYED_RELEASE_TABLET | Freq: Every morning | ORAL | 1 refills | Status: AC

## 2024-02-03 MED ORDER — TADALAFIL 20 MG PO TABS: 20.0000 mg | ORAL_TABLET | Freq: Every day | ORAL | 2 refills | Status: DC | PRN

## 2024-02-03 MED ORDER — EROXON EX GEL: 1.0000 | CUTANEOUS | 1 refills | Status: AC

## 2024-02-03 MED ORDER — ROSUVASTATIN CALCIUM 40 MG PO TABS: 40.0000 mg | ORAL_TABLET | Freq: Every evening | ORAL | 1 refills | Status: AC

## 2024-02-03 NOTE — Progress Notes (Signed)
 Established Patient Office Visit  Subjective:  Patient ID: Brett [name], male    DOB: 11/08/1975  Age: 48 y.o. MRN: [id number]  Chief Complaint  Patient presents with   Follow-up    3 month follow up    No new complaints, here for lab review and medication refills. Labs reviewed and notable for well controlled diabetes, A1c stable and remains at target, lipids deteriorated with unremarkable cmp. He admits to dietary indiscretion but denies any hypoglycemic episodes and home bg readings have been at target.     No other concerns at this time.   Past Medical History:  Diagnosis Date   Acid reflux    Bilateral inguinal hernia (BIH)    DM (diabetes mellitus), type 2 (HCC)    Former smoker    History of pneumonia 2019   Mixed hyperlipidemia    Speech impediment     Past Surgical History:  Procedure Laterality Date   INSERTION OF MESH  07/29/2023   Procedure: INSERTION OF MESH;  Surgeon: Jordis Laneta FALCON, MD;  Location: [facility] ORS;  Service: General;;   TONSILLECTOMY Bilateral    as a child    Social History   Socioeconomic History   Marital status: Married    Spouse name: Not on file   Number of children: Not on file   Years of education: Not on file   Highest education level: Not on file  Occupational History   Not on file  Tobacco Use   Smoking status: Former    Current packs/day: 0.00    Types: Cigars, Cigarettes    Quit date: 11/2015    Years since quitting: 8.2    Passive exposure: Past   Smokeless tobacco: Never   Tobacco comments:    smokes 1-2 cigars daily for 0.5 year--07/03/2021  Vaping Use   Vaping status: Never Used  Substance and Sexual Activity   Alcohol use: Yes    Comment: occasionally    Drug use: Not Currently    Types: Marijuana    Comment: quit 2017   Sexual activity: Not on file  Other Topics Concern   Not on file  Social History Narrative   Not on file   Social Drivers of Health   Financial Resource Strain: Not on file  Food  Insecurity: Not on file  Transportation Needs: Not on file  Physical Activity: Not on file  Stress: Not on file  Social Connections: Not on file  Intimate Partner Violence: Not on file    Family History  Problem Relation Age of Onset   Healthy Mother    Pneumonia Father    Diabetes Maternal Grandmother     Allergies  Allergen Reactions   Penicillins Hives    Outpatient Medications Prior to Visit  Medication Sig   glucose blood (ONETOUCH ULTRA TEST) test strip Use with device to check sugars 2-3 times daily   [DISCONTINUED] pantoprazole  (PROTONIX ) 40 MG tablet Take 1 tablet (40 mg total) by mouth every morning.   [DISCONTINUED] rosuvastatin  (CRESTOR ) 40 MG tablet Take 1 tablet (40 mg total) by mouth at bedtime.   [DISCONTINUED] carbamide peroxide (DEBROX) 6.5 % OTIC solution Place 5 drops into both ears 2 (two) times daily. (Patient not taking: Reported on 02/03/2024)   No facility-administered medications prior to visit.    Review of Systems  Constitutional:  Positive for weight loss (4 lbs).  HENT: Negative.    Eyes: Negative.   Respiratory: Negative.    Cardiovascular: Negative.   Gastrointestinal: Negative.  Genitourinary: Negative.   Musculoskeletal:  Positive for joint pain (right knee).  Skin: Negative.   Neurological: Negative.   Endo/Heme/Allergies: Negative.   Psychiatric/Behavioral: Negative.         Objective:   BP 108/80   Pulse 84   Temp 98.4 F (36.9 C)   Ht 6' (1.829 m)   Wt 203 lb 3.2 oz (92.2 kg)   SpO2 96%   BMI 27.56 kg/m   Vitals:   02/03/24 1420  BP: 108/80  Pulse: 84  Temp: 98.4 F (36.9 C)  Height: 6' (1.829 m)  Weight: 203 lb 3.2 oz (92.2 kg)  SpO2: 96%  BMI (Calculated): 27.55    Physical Exam Vitals reviewed.  Constitutional:      General: He is not in acute distress.    Appearance: Normal appearance. He is obese.  HENT:     Head: Normocephalic.     Left Ear: There is no impacted cerumen.     Nose: Nose normal.      Mouth/Throat:     Mouth: Mucous membranes are moist.     Pharynx: No posterior oropharyngeal erythema.  Eyes:     Extraocular Movements: Extraocular movements intact.     Pupils: Pupils are equal, round, and reactive to light.  Cardiovascular:     Rate and Rhythm: Regular rhythm.     Chest Wall: PMI is not displaced.     Pulses: Normal pulses.     Heart sounds: Normal heart sounds. No murmur heard. Pulmonary:     Effort: Pulmonary effort is normal.     Breath sounds: Normal air entry. No rhonchi or rales.  Abdominal:     General: Abdomen is flat. Bowel sounds are normal. There is no distension.     Palpations: Abdomen is soft. There is no hepatomegaly, splenomegaly or mass.     Tenderness: There is no abdominal tenderness.     Hernia: A hernia is present. Hernia is present in the right inguinal area (reducible).  Musculoskeletal:        General: Normal range of motion.     Cervical back: Normal range of motion and neck supple.     Right lower leg: No edema.     Left lower leg: No edema.  Skin:    General: Skin is warm and dry.  Neurological:     General: No focal deficit present.     Mental Status: He is alert and oriented to person, place, and time.     Cranial Nerves: No cranial nerve deficit.     Motor: Tremor (bilateral, fine of outstretched hands) present. No weakness.  Psychiatric:        Mood and Affect: Mood normal.        Behavior: Behavior normal.      Results for orders placed or performed in visit on 02/03/24  POCT CBG (Fasting - Glucose)  Result Value Ref Range   Glucose Fasting, POC 93 70 - 99 mg/dL    Recent Results (from the past 2160 hours)  Lipid panel     Status: Abnormal   Collection Time: 12/26/23  8:35 AM  Result Value Ref Range   Cholesterol, Total 213 (H) 100 - 199 mg/dL   Triglycerides 893 0 - 149 mg/dL   HDL 40 >60 mg/dL   VLDL Cholesterol Cal 19 5 - 40 mg/dL   LDL Chol Calc (NIH) 845 (H) 0 - 99 mg/dL   Chol/HDL Ratio 5.3 (H) 0.0 -  5.0 ratio  Comment:                                   T. Chol/HDL Ratio                                             Men  Women                               1/2 Avg.Risk  3.4    3.3                                   Avg.Risk  5.0    4.4                                2X Avg.Risk  9.6    7.1                                3X Avg.Risk 23.4   11.0   Hemoglobin A1c     Status: Abnormal   Collection Time: 12/26/23  8:36 AM  Result Value Ref Range   Hgb A1c MFr Bld 6.2 (H) 4.8 - 5.6 %    Comment:          Prediabetes: 5.7 - 6.4          Diabetes: >6.4          Glycemic control for adults with diabetes: <7.0    Est. average glucose Bld gHb Est-mCnc 131 mg/dL  POCT Glucose (CBG)     Status: Abnormal   Collection Time: 12/30/23 11:05 AM  Result Value Ref Range   POC Glucose 100 (A) 70 - 99 mg/dl  POCT CBG (Fasting - Glucose)     Status: Abnormal   Collection Time: 02/03/24  2:26 PM  Result Value Ref Range   Glucose Fasting, POC 93 70 - 99 mg/dL      Assessment & Plan:  Dam was seen today for follow-up.  Controlled type 2 diabetes mellitus without complication, without long-term current use of insulin (HCC) -     POCT CBG (Fasting - Glucose) -     Hemoglobin A1c  Mixed hyperlipidemia -     Rosuvastatin  Calcium ; Take 1 tablet (40 mg total) by mouth at bedtime.  Dispense: 90 tablet; Refill: 1 -     Lipid panel -     Comprehensive metabolic panel with GFR  Gastroesophageal reflux disease without esophagitis -     Pantoprazole  Sodium; Take 1 tablet (40 mg total) by mouth every morning.  Dispense: 90 tablet; Refill: 1 -     CBC With Diff/Platelet  Tremor -     Ambulatory referral to Neurology  Erectile dysfunction due to diseases classified elsewhere -     Tadalafil ; Take 1 tablet (20 mg total) by mouth daily as needed for erectile dysfunction. 30 mins to 4 hr before intercourse  Dispense: 10 tablet; Refill: 2 -     Eroxon; Apply 1 Package topically as directed for 4 doses.  Apply one tube to glans penis prior to  intercourse  Dispense: 1 each; Refill: 1   Patient instructed to comply with medications as prescribed  Problem List Items Addressed This Visit       Digestive   Gastroesophageal reflux disease without esophagitis   Relevant Medications   pantoprazole  (PROTONIX ) 40 MG tablet   Other Relevant Orders   CBC With Diff/Platelet     Endocrine   Controlled type 2 diabetes mellitus without complication, without long-term current use of insulin (HCC) - Primary   Relevant Medications   rosuvastatin  (CRESTOR ) 40 MG tablet   Other Relevant Orders   POCT CBG (Fasting - Glucose) (Completed)   Hemoglobin A1c     Other   Tremor   Relevant Orders   Ambulatory referral to Neurology   Mixed hyperlipidemia   Relevant Medications   rosuvastatin  (CRESTOR ) 40 MG tablet   tadalafil  (CIALIS ) 20 MG tablet   Other Relevant Orders   Comprehensive metabolic panel with GFR   Other Visit Diagnoses       Erectile dysfunction due to diseases classified elsewhere       Relevant Medications   tadalafil  (CIALIS ) 20 MG tablet   Intimacy Products (EROXON) GEL       Return in about 3 months (around 05/04/2024) for cpe with labs prior.   Total time spent: 20 minutes  Sherrill Cinderella Perry, MD  02/03/2024   This document may have been prepared by [facility] Voice Recognition software and as such may include unintentional dictation errors.

## 2024-05-10 ENCOUNTER — Encounter: Admitting: Internal Medicine

## 2024-05-19 ENCOUNTER — Encounter: Payer: Self-pay | Admitting: Internal Medicine

## 2024-05-19 ENCOUNTER — Ambulatory Visit (INDEPENDENT_AMBULATORY_CARE_PROVIDER_SITE_OTHER): Admitting: Internal Medicine

## 2024-05-19 ENCOUNTER — Encounter: Admitting: Internal Medicine

## 2024-05-19 VITALS — BP 112/81 | HR 90 | Temp 97.9°F | Ht 72.0 in | Wt 207.8 lb

## 2024-05-19 DIAGNOSIS — Z0001 Encounter for general adult medical examination with abnormal findings: Secondary | ICD-10-CM

## 2024-05-19 DIAGNOSIS — Z1331 Encounter for screening for depression: Secondary | ICD-10-CM

## 2024-05-19 DIAGNOSIS — R251 Tremor, unspecified: Secondary | ICD-10-CM | POA: Diagnosis not present

## 2024-05-19 DIAGNOSIS — E119 Type 2 diabetes mellitus without complications: Secondary | ICD-10-CM

## 2024-05-19 DIAGNOSIS — Z1211 Encounter for screening for malignant neoplasm of colon: Secondary | ICD-10-CM

## 2024-05-19 LAB — POCT CBG (FASTING - GLUCOSE)-MANUAL ENTRY: Glucose Fasting, POC: 93 mg/dL (ref 70–99)

## 2024-05-19 MED ORDER — ONETOUCH ULTRASOFT 2 LANCETS MISC: 1.0000 [IU] | Freq: Two times a day (BID) | 2 refills | Status: AC

## 2024-05-19 MED ORDER — PROPRANOLOL HCL 20 MG PO TABS: 20.0000 mg | ORAL_TABLET | Freq: Three times a day (TID) | ORAL | 0 refills | Status: DC

## 2024-05-19 NOTE — Progress Notes (Signed)
 "  Established Patient Office Visit  Subjective:  Patient ID: Brett [name], male    DOB: 04-Feb-1976  Age: 48 y.o. MRN: [id number]  Chief Complaint  Patient presents with   Annual Exam    CPE    Here for CPE, lab review and medication refills. Failed to have previsit labs done. Still c/o tremors that have failed to improve with Gabapentin .      No other concerns at this time.   Past Medical History:  Diagnosis Date   Acid reflux    Bilateral inguinal hernia (BIH)    DM (diabetes mellitus), type 2 (HCC)    Former smoker    History of pneumonia 2019   Mixed hyperlipidemia    Speech impediment     Past Surgical History:  Procedure Laterality Date   INSERTION OF MESH  07/29/2023   Procedure: INSERTION OF MESH;  Surgeon: Jordis Laneta FALCON, MD;  Location: [facility] ORS;  Service: General;;   TONSILLECTOMY Bilateral    as a child    Social History   Socioeconomic History   Marital status: Married    Spouse name: Not on file   Number of children: Not on file   Years of education: Not on file   Highest education level: Not on file  Occupational History   Not on file  Tobacco Use   Smoking status: Former    Current packs/day: 0.00    Types: Cigars, Cigarettes    Quit date: 11/2015    Years since quitting: 8.5    Passive exposure: Past   Smokeless tobacco: Never   Tobacco comments:    smokes 1-2 cigars daily for 0.5 year--07/03/2021  Vaping Use   Vaping status: Never Used  Substance and Sexual Activity   Alcohol use: Yes    Comment: occasionally    Drug use: Not Currently    Types: Marijuana    Comment: quit 2017   Sexual activity: Not on file  Other Topics Concern   Not on file  Social History Narrative   Not on file   Social Drivers of Health   Tobacco Use: Medium Risk (05/19/2024)   Patient History    Smoking Tobacco Use: Former    Smokeless Tobacco Use: Never    Passive Exposure: Past  Physicist, Medical Strain: Low Risk  (04/07/2024)   Received from [facility]   Overall Financial Resource Strain (CARDIA)    Difficulty of Paying Living Expenses: Not very hard  Food Insecurity: No Food Insecurity (04/07/2024)   Received from [facility]   Epic    Within the past 12 months, you worried that your food would run out before you got the money to buy more.: Never true    Within the past 12 months, the food you bought just didn't last and you didn't have money to get more.: Never true  Transportation Needs: No Transportation Needs (04/07/2024)   Received from [facility] - Transportation    In the past 12 months, has lack of transportation kept you from medical appointments or from getting medications?: No    Lack of Transportation (Non-Medical): No  Physical Activity: Not on file  Stress: Not on file  Social Connections: Not on file  Intimate Partner Violence: Not on file  Depression [phone]): Low Risk (04/16/2023)   Depression (PHQ2-9)    PHQ-2 Score: 2  Alcohol Screen: Not on file  Housing: Low Risk  (04/07/2024)   Received from  [facility]   Epic    In the last 12 months, was there a time when you were not able to pay the mortgage or rent on time?: No    In the past 12 months, how many times have you moved where you were living?: 0    At any time in the past 12 months, were you homeless or living in a shelter (including now)?: No  Utilities: Not At Risk (04/07/2024)   Received from [facility]   Epic    In the past 12 months has the electric, gas, oil, or water company threatened to shut off services in your home?: No  Health Literacy: Not on file    Family History  Problem Relation Age of Onset   Healthy Mother    Pneumonia Father    Diabetes Maternal Grandmother     Allergies[1]  Show/hide medication list[2]  Review of Systems  Constitutional:  Positive for weight loss (4 lbs).  HENT: Negative.    Eyes: Negative.    Respiratory: Negative.    Cardiovascular: Negative.   Gastrointestinal:  Positive for heartburn.  Genitourinary: Negative.   Musculoskeletal:  Negative for joint pain.  Skin: Negative.   Neurological: Negative.   Endo/Heme/Allergies: Negative.   Psychiatric/Behavioral: Negative.         Objective:   BP 112/81   Pulse 90   Temp 97.9 F (36.6 C)   Ht 6' (1.829 m)   Wt 207 lb 12.8 oz (94.3 kg)   SpO2 95%   BMI 28.18 kg/m   Vitals:   05/19/24 1132  BP: 112/81  Pulse: 90  Temp: 97.9 F (36.6 C)  Height: 6' (1.829 m)  Weight: 207 lb 12.8 oz (94.3 kg)  SpO2: 95%  BMI (Calculated): 28.18    Physical Exam Vitals reviewed.  Constitutional:      General: He is not in acute distress.    Appearance: Normal appearance. He is obese.  HENT:     Head: Normocephalic.     Left Ear: There is no impacted cerumen.     Nose: Nose normal.     Mouth/Throat:     Mouth: Mucous membranes are moist.     Pharynx: No posterior oropharyngeal erythema.  Eyes:     Extraocular Movements: Extraocular movements intact.     Pupils: Pupils are equal, round, and reactive to light.  Cardiovascular:     Rate and Rhythm: Regular rhythm.     Chest Wall: PMI is not displaced.     Pulses: Normal pulses.     Heart sounds: Normal heart sounds. No murmur heard. Pulmonary:     Effort: Pulmonary effort is normal.     Breath sounds: Normal air entry. No rhonchi or rales.  Abdominal:     General: Abdomen is flat. Bowel sounds are normal. There is no distension.     Palpations: Abdomen is soft. There is no hepatomegaly, splenomegaly or mass.     Tenderness: There is no abdominal tenderness.  Musculoskeletal:        General: Normal range of motion.     Cervical back: Normal range of motion and neck supple.     Right lower leg: No edema.     Left lower leg: No edema.  Skin:    General: Skin is warm and dry.  Neurological:     General: No focal deficit present.     Mental Status: He is alert and  oriented to person, place, and  time.     Cranial Nerves: No cranial nerve deficit.     Motor: Tremor (bilateral, fine of outstretched hands) present. No weakness.  Psychiatric:        Mood and Affect: Mood normal.        Behavior: Behavior normal.      Results for orders placed or performed in visit on 05/19/24  POCT CBG (Fasting - Glucose)  Result Value Ref Range   Glucose Fasting, POC 93 70 - 99 mg/dL    Recent Results (from the past 2160 hours)  POCT CBG (Fasting - Glucose)     Status: Normal   Collection Time: 05/19/24 11:40 AM  Result Value Ref Range   Glucose Fasting, POC 93 70 - 99 mg/dL      Assessment & Plan:  Carvell was seen today for annual exam.  Controlled type 2 diabetes mellitus without complication, without long-term current use of insulin (HCC) -     POCT CBG (Fasting - Glucose) -     OneTouch UltraSoft 2 Lancets; 1 Units by Does not apply route 2 (two) times daily.  Dispense: 100 each; Refill: 2  Encounter for general adult medical examination with abnormal findings  Tremor -     Propranolol HCl; Take 1 tablet (20 mg total) by mouth 3 (three) times daily.  Dispense: 90 tablet; Refill: 0  Colon cancer screening -     Ambulatory referral to Gastroenterology    Problem List Items Addressed This Visit       Endocrine   Controlled type 2 diabetes mellitus without complication, without long-term current use of insulin (HCC) - Primary   Relevant Medications   OneTouch UltraSoft 2 Lancets MISC   Other Relevant Orders   POCT CBG (Fasting - Glucose) (Completed)     Other   Tremor   Relevant Medications   propranolol (INDERAL) 20 MG tablet   Other Visit Diagnoses       Encounter for general adult medical examination with abnormal findings         Colon cancer screening       Relevant Orders   Ambulatory referral to Gastroenterology       Return in 2 weeks (on 06/02/2024) for fu with labs prior.   Total time spent: 30 minutes. This time  includes review of previous notes and results and patient face to face interaction during today'Augustine Brannick visit.    Sherrill Cinderella Perry, MD  05/19/2024   This document may have been prepared by [facility] Voice Recognition software and as such may include unintentional dictation errors.     [1]  Allergies Allergen Reactions   Penicillins Hives  [2]  Outpatient Medications Prior to Visit  Medication Sig   glucose blood (ONETOUCH ULTRA TEST) test strip Use with device to check sugars 2-3 times daily   Intimacy Products (EROXON) GEL Apply 1 Package topically as directed for 4 doses. Apply one tube to glans penis prior to intercourse   pantoprazole  (PROTONIX ) 40 MG tablet Take 1 tablet (40 mg total) by mouth every morning.   rosuvastatin  (CRESTOR ) 40 MG tablet Take 1 tablet (40 mg total) by mouth at bedtime.   tadalafil  (CIALIS ) 20 MG tablet Take 1 tablet (20 mg total) by mouth daily as needed for erectile dysfunction. 30 mins to 4 hr before intercourse   No facility-administered medications prior to visit.   "

## 2024-06-02 ENCOUNTER — Other Ambulatory Visit

## 2024-06-02 DIAGNOSIS — E782 Mixed hyperlipidemia: Secondary | ICD-10-CM

## 2024-06-02 DIAGNOSIS — E1165 Type 2 diabetes mellitus with hyperglycemia: Secondary | ICD-10-CM

## 2024-06-03 LAB — CBC WITH DIFFERENTIAL/PLATELET
Basophils Absolute: 0 x10E3/uL (ref 0.0–0.2)
Basos: 0 %
EOS (ABSOLUTE): 0.2 x10E3/uL (ref 0.0–0.4)
Eos: 3 %
Hematocrit: 47.6 % (ref 37.5–51.0)
Hemoglobin: 15.1 g/dL (ref 13.0–17.7)
Immature Grans (Abs): 0 x10E3/uL (ref 0.0–0.1)
Immature Granulocytes: 0 %
Lymphocytes Absolute: 1.4 x10E3/uL (ref 0.7–3.1)
Lymphs: 29 %
MCH: 29.7 pg (ref 26.6–33.0)
MCHC: 31.7 g/dL (ref 31.5–35.7)
MCV: 94 fL (ref 79–97)
Monocytes Absolute: 0.5 x10E3/uL (ref 0.1–0.9)
Monocytes: 11 %
Neutrophils Absolute: 2.7 x10E3/uL (ref 1.4–7.0)
Neutrophils: 57 %
Platelets: 157 x10E3/uL (ref 150–450)
RBC: 5.08 x10E6/uL (ref 4.14–5.80)
RDW: 12.1 % (ref 11.6–15.4)
WBC: 4.8 x10E3/uL (ref 3.4–10.8)

## 2024-06-03 LAB — LIPID PANEL
Chol/HDL Ratio: 4.7 ratio (ref 0.0–5.0)
Cholesterol, Total: 179 mg/dL (ref 100–199)
HDL: 38 mg/dL — ABNORMAL LOW
LDL Chol Calc (NIH): 117 mg/dL — ABNORMAL HIGH (ref 0–99)
Triglycerides: 135 mg/dL (ref 0–149)
VLDL Cholesterol Cal: 24 mg/dL (ref 5–40)

## 2024-06-03 LAB — CMP14+EGFR
ALT: 35 IU/L (ref 0–44)
AST: 22 IU/L (ref 0–40)
Albumin: 4.5 g/dL (ref 4.1–5.1)
Alkaline Phosphatase: 62 IU/L (ref 47–123)
BUN/Creatinine Ratio: 15 (ref 9–20)
BUN: 20 mg/dL (ref 6–24)
Bilirubin Total: 0.6 mg/dL (ref 0.0–1.2)
CO2: 24 mmol/L (ref 20–29)
Calcium: 9.4 mg/dL (ref 8.7–10.2)
Chloride: 106 mmol/L (ref 96–106)
Creatinine, Ser: 1.32 mg/dL — ABNORMAL HIGH (ref 0.76–1.27)
Globulin, Total: 2.1 g/dL (ref 1.5–4.5)
Glucose: 111 mg/dL — ABNORMAL HIGH (ref 70–99)
Potassium: 4.1 mmol/L (ref 3.5–5.2)
Sodium: 144 mmol/L (ref 134–144)
Total Protein: 6.6 g/dL (ref 6.0–8.5)
eGFR: 67 mL/min/1.73

## 2024-06-03 LAB — HEMOGLOBIN A1C
Est. average glucose Bld gHb Est-mCnc: 128 mg/dL
Hgb A1c MFr Bld: 6.1 % — ABNORMAL HIGH (ref 4.8–5.6)

## 2024-06-04 ENCOUNTER — Ambulatory Visit: Admitting: Internal Medicine

## 2024-06-04 ENCOUNTER — Ambulatory Visit: Payer: Self-pay | Admitting: Internal Medicine

## 2024-06-04 VITALS — BP 126/72 | HR 76 | Temp 98.2°F | Ht 72.0 in | Wt 208.4 lb

## 2024-06-04 DIAGNOSIS — N179 Acute kidney failure, unspecified: Secondary | ICD-10-CM

## 2024-06-04 DIAGNOSIS — E1165 Type 2 diabetes mellitus with hyperglycemia: Secondary | ICD-10-CM | POA: Diagnosis not present

## 2024-06-04 DIAGNOSIS — E119 Type 2 diabetes mellitus without complications: Secondary | ICD-10-CM

## 2024-06-04 DIAGNOSIS — E1129 Type 2 diabetes mellitus with other diabetic kidney complication: Secondary | ICD-10-CM

## 2024-06-04 DIAGNOSIS — Z0130 Encounter for examination of blood pressure without abnormal findings: Secondary | ICD-10-CM

## 2024-06-04 DIAGNOSIS — R809 Proteinuria, unspecified: Secondary | ICD-10-CM

## 2024-06-04 DIAGNOSIS — E782 Mixed hyperlipidemia: Secondary | ICD-10-CM

## 2024-06-04 DIAGNOSIS — N521 Erectile dysfunction due to diseases classified elsewhere: Secondary | ICD-10-CM

## 2024-06-04 LAB — POCT URINALYSIS DIPSTICK
Bilirubin, UA: NEGATIVE
Blood, UA: NEGATIVE
Glucose, UA: NEGATIVE
Ketones, UA: NEGATIVE
Leukocytes, UA: NEGATIVE
Nitrite, UA: NEGATIVE
Protein, UA: NEGATIVE
Spec Grav, UA: 1.015
Urobilinogen, UA: 0.2 U/dL
pH, UA: 6.5

## 2024-06-04 LAB — POC CREATINE & ALBUMIN,URINE
Creatinine, POC: 100 mg/dL
Microalbumin Ur, POC: 30 mg/L

## 2024-06-04 LAB — POCT CBG (FASTING - GLUCOSE)-MANUAL ENTRY: Glucose Fasting, POC: 103 mg/dL — AB (ref 70–99)

## 2024-06-04 MED ORDER — TADALAFIL 20 MG PO TABS: 20.0000 mg | ORAL_TABLET | Freq: Every day | ORAL | 2 refills | Status: AC | PRN

## 2024-06-04 MED ORDER — EZETIMIBE 10 MG PO TABS: 10.0000 mg | ORAL_TABLET | Freq: Every day | ORAL | 1 refills | Status: AC

## 2024-06-04 MED ORDER — EMPAGLIFLOZIN 10 MG PO TABS: 10.0000 mg | ORAL_TABLET | Freq: Every day | ORAL | 0 refills | Status: AC

## 2024-06-04 NOTE — Progress Notes (Signed)
 "  Established Patient Office Visit  Subjective:  Patient ID: Brett [name], male    DOB: January 07, 1976  Age: 49 y.o. MRN: [id number]  Chief Complaint  Patient presents with   Follow-up    2 week follow up lab results    No new complaints, here for lab review and medication refills. Labs reviewed and notable for improvement in lipids but still not at target. A1c has improved to 6.1 while Cr has deteriorated slightly.     No other concerns at this time.   Past Medical History:  Diagnosis Date   Acid reflux    Bilateral inguinal hernia (BIH)    DM (diabetes mellitus), type 2 (HCC)    Former smoker    History of pneumonia 2019   Mixed hyperlipidemia    Speech impediment     Past Surgical History:  Procedure Laterality Date   INSERTION OF MESH  07/29/2023   Procedure: INSERTION OF MESH;  Surgeon: Jordis Laneta FALCON, MD;  Location: [facility] ORS;  Service: General;;   TONSILLECTOMY Bilateral    as a child    Social History   Socioeconomic History   Marital status: Married    Spouse name: Not on file   Number of children: Not on file   Years of education: Not on file   Highest education level: Not on file  Occupational History   Not on file  Tobacco Use   Smoking status: Former    Current packs/day: 0.00    Types: Cigars, Cigarettes    Quit date: 11/2015    Years since quitting: 8.5    Passive exposure: Past   Smokeless tobacco: Never   Tobacco comments:    smokes 1-2 cigars daily for 0.5 year--07/03/2021  Vaping Use   Vaping status: Never Used  Substance and Sexual Activity   Alcohol use: Yes    Comment: occasionally    Drug use: Not Currently    Types: Marijuana    Comment: quit 2017   Sexual activity: Not on file  Other Topics Concern   Not on file  Social History Narrative   Not on file   Social Drivers of Health   Tobacco Use: Medium Risk (05/19/2024)   Patient History    Smoking Tobacco Use: Former    Smokeless Tobacco Use: Never    Passive Exposure: Past   Physicist, Medical Strain: Low Risk  (04/07/2024)   Received from [facility]   Overall Financial Resource Strain (CARDIA)    Difficulty of Paying Living Expenses: Not very hard  Food Insecurity: No Food Insecurity (04/07/2024)   Received from [facility]   Epic    Within the past 12 months, you worried that your food would run out before you got the money to buy more.: Never true    Within the past 12 months, the food you bought just didn't last and you didn't have money to get more.: Never true  Transportation Needs: No Transportation Needs (04/07/2024)   Received from [facility] - Transportation    In the past 12 months, has lack of transportation kept you from medical appointments or from getting medications?: No    Lack of Transportation (Non-Medical): No  Physical Activity: Not on file  Stress: Not on file  Social Connections: Not on file  Intimate Partner Violence: Not on file  Depression (PHQ2-9): Low Risk (05/19/2024)   Depression (PHQ2-9)    PHQ-2 Score: 0  Alcohol Screen: Not  on file  Housing: Low Risk  (04/07/2024)   Received from [facility]   Epic    In the last 12 months, was there a time when you were not able to pay the mortgage or rent on time?: No    In the past 12 months, how many times have you moved where you were living?: 0    At any time in the past 12 months, were you homeless or living in a shelter (including now)?: No  Utilities: Not At Risk (04/07/2024)   Received from [facility]   Epic    In the past 12 months has the electric, gas, oil, or water company threatened to shut off services in your home?: No  Health Literacy: Not on file    Family History  Problem Relation Age of Onset   Healthy Mother    Pneumonia Father    Diabetes Maternal Grandmother     Allergies[1]  Show/hide medication list[2]  Review of Systems  Constitutional:   Negative for weight loss (gained 1 lb).  HENT: Negative.    Eyes: Negative.   Respiratory: Negative.    Cardiovascular: Negative.   Gastrointestinal:  Positive for heartburn.  Genitourinary: Negative.   Musculoskeletal:  Negative for joint pain.  Skin: Negative.   Neurological: Negative.   Endo/Heme/Allergies: Negative.   Psychiatric/Behavioral: Negative.         Objective:   BP 126/72   Pulse 76   Temp 98.2 F (36.8 C)   Ht 6' (1.829 m)   Wt 208 lb 6.4 oz (94.5 kg)   SpO2 95%   BMI 28.26 kg/m   Vitals:   06/04/24 1448  BP: 126/72  Pulse: 76  Temp: 98.2 F (36.8 C)  Height: 6' (1.829 m)  Weight: 208 lb 6.4 oz (94.5 kg)  SpO2: 95%  BMI (Calculated): 28.26    Physical Exam Vitals reviewed.  Constitutional:      General: He is not in acute distress.    Appearance: Normal appearance. He is obese.  HENT:     Head: Normocephalic.     Left Ear: There is no impacted cerumen.     Nose: Nose normal.     Mouth/Throat:     Mouth: Mucous membranes are moist.     Pharynx: No posterior oropharyngeal erythema.  Eyes:     Extraocular Movements: Extraocular movements intact.     Pupils: Pupils are equal, round, and reactive to light.  Cardiovascular:     Rate and Rhythm: Regular rhythm.     Chest Wall: PMI is not displaced.     Pulses: Normal pulses.     Heart sounds: Normal heart sounds. No murmur heard. Pulmonary:     Effort: Pulmonary effort is normal.     Breath sounds: Normal air entry. No rhonchi or rales.  Abdominal:     General: Abdomen is flat. Bowel sounds are normal. There is no distension.     Palpations: Abdomen is soft. There is no hepatomegaly, splenomegaly or mass.     Tenderness: There is no abdominal tenderness.  Musculoskeletal:        General: Normal range of motion.     Cervical back: Normal range of motion and neck supple.     Right lower leg: No edema.     Left lower leg: No edema.  Skin:    General: Skin is warm and dry.  Neurological:      General: No focal deficit present.  Mental Status: He is alert and oriented to person, place, and time.     Cranial Nerves: No cranial nerve deficit.     Motor: Tremor (bilateral, fine of outstretched hands) present. No weakness.  Psychiatric:        Mood and Affect: Mood normal.        Behavior: Behavior normal.      Results for orders placed or performed in visit on 06/04/24  POCT CBG (Fasting - Glucose)  Result Value Ref Range   Glucose Fasting, POC 103 (A) 70 - 99 mg/dL  POCT Urinalysis Dipstick (81002)  Result Value Ref Range   Color, UA     Clarity, UA     Glucose, UA Negative Negative   Bilirubin, UA Negative    Ketones, UA Negative    Spec Grav, UA 1.015 1.010 - 1.025   Blood, UA Negative    pH, UA 6.5 5.0 - 8.0   Protein, UA Negative Negative   Urobilinogen, UA 0.2 0.2 or 1.0 E.U./dL   Nitrite, UA Negative    Leukocytes, UA Negative Negative   Appearance     Odor    POC CREATINE & ALBUMIN,URINE  Result Value Ref Range   Microalbumin Ur, POC 30 mg/L   Creatinine, POC 100 mg/dL   Albumin/Creatinine Ratio, Urine, POC 30-300     Recent Results (from the past 2160 hours)  POCT CBG (Fasting - Glucose)     Status: Normal   Collection Time: 05/19/24 11:40 AM  Result Value Ref Range   Glucose Fasting, POC 93 70 - 99 mg/dL  CBC with Differential/Platelet     Status: None   Collection Time: 06/02/24  8:27 AM  Result Value Ref Range   WBC 4.8 3.4 - 10.8 x10E3/uL   RBC 5.08 4.14 - 5.80 x10E6/uL   Hemoglobin 15.1 13.0 - 17.7 g/dL   Hematocrit 52.3 62.4 - 51.0 %   MCV 94 79 - 97 fL   MCH 29.7 26.6 - 33.0 pg   MCHC 31.7 31.5 - 35.7 g/dL   RDW 87.8 88.3 - 84.5 %   Platelets 157 150 - 450 x10E3/uL   Neutrophils 57 Not Estab. %   Lymphs 29 Not Estab. %   Monocytes 11 Not Estab. %   Eos 3 Not Estab. %   Basos 0 Not Estab. %   Neutrophils Absolute 2.7 1.4 - 7.0 x10E3/uL   Lymphocytes Absolute 1.4 0.7 - 3.1 x10E3/uL   Monocytes Absolute 0.5 0.1 - 0.9 x10E3/uL    EOS (ABSOLUTE) 0.2 0.0 - 0.4 x10E3/uL   Basophils Absolute 0.0 0.0 - 0.2 x10E3/uL   Immature Granulocytes 0 Not Estab. %   Immature Grans (Abs) 0.0 0.0 - 0.1 x10E3/uL  CMP14+EGFR     Status: Abnormal   Collection Time: 06/02/24  8:27 AM  Result Value Ref Range   Glucose 111 (H) 70 - 99 mg/dL   BUN 20 6 - 24 mg/dL   Creatinine, Ser 8.67 (H) 0.76 - 1.27 mg/dL   eGFR 67 >40 fO/fpw/8.26   BUN/Creatinine Ratio 15 9 - 20   Sodium 144 134 - 144 mmol/L   Potassium 4.1 3.5 - 5.2 mmol/L   Chloride 106 96 - 106 mmol/L   CO2 24 20 - 29 mmol/L   Calcium  9.4 8.7 - 10.2 mg/dL   Total Protein 6.6 6.0 - 8.5 g/dL   Albumin 4.5 4.1 - 5.1 g/dL   Globulin, Total 2.1 1.5 - 4.5 g/dL   Bilirubin Total 0.6 0.0 -  1.2 mg/dL   Alkaline Phosphatase 62 47 - 123 IU/L   AST 22 0 - 40 IU/L   ALT 35 0 - 44 IU/L  Hemoglobin A1c     Status: Abnormal   Collection Time: 06/02/24  8:27 AM  Result Value Ref Range   Hgb A1c MFr Bld 6.1 (H) 4.8 - 5.6 %    Comment:          Prediabetes: 5.7 - 6.4          Diabetes: >6.4          Glycemic control for adults with diabetes: <7.0    Est. average glucose Bld gHb Est-mCnc 128 mg/dL  Lipid panel     Status: Abnormal   Collection Time: 06/02/24  8:27 AM  Result Value Ref Range   Cholesterol, Total 179 100 - 199 mg/dL   Triglycerides 864 0 - 149 mg/dL   HDL 38 (L) >60 mg/dL   VLDL Cholesterol Cal 24 5 - 40 mg/dL   LDL Chol Calc (NIH) 882 (H) 0 - 99 mg/dL   Chol/HDL Ratio 4.7 0.0 - 5.0 ratio    Comment:                                   T. Chol/HDL Ratio                                             Men  Women                               1/2 Avg.Risk  3.4    3.3                                   Avg.Risk  5.0    4.4                                2X Avg.Risk  9.6    7.1                                3X Avg.Risk 23.4   11.0   POCT CBG (Fasting - Glucose)     Status: Abnormal   Collection Time: 06/04/24  2:52 PM  Result Value Ref Range   Glucose Fasting, POC 103  (A) 70 - 99 mg/dL  POC CREATINE & ALBUMIN,URINE     Status: Abnormal   Collection Time: 06/04/24  3:17 PM  Result Value Ref Range   Microalbumin Ur, POC 30 mg/L   Creatinine, POC 100 mg/dL   Albumin/Creatinine Ratio, Urine, POC 30-300   POCT Urinalysis Dipstick (18997)     Status: Normal   Collection Time: 06/04/24  3:18 PM  Result Value Ref Range   Color, UA     Clarity, UA     Glucose, UA Negative Negative   Bilirubin, UA Negative    Ketones, UA Negative    Spec Grav, UA 1.015 1.010 - 1.025   Blood, UA Negative    pH, UA 6.5 5.0 - 8.0   Protein, UA  Negative Negative   Urobilinogen, UA 0.2 0.2 or 1.0 E.U./dL   Nitrite, UA Negative    Leukocytes, UA Negative Negative   Appearance     Odor        Assessment & Plan:  Hamza was seen today for follow-up.  Controlled type 2 diabetes mellitus without complication, without long-term current use of insulin (HCC) -     POCT CBG (Fasting - Glucose)  Acute kidney injury -     Basic metabolic panel with GFR -     POCT urinalysis dipstick -     POC CREATINE & ALBUMIN,URINE  Mixed hyperlipidemia -     Ezetimibe ; Take 1 tablet (10 mg total) by mouth daily.  Dispense: 90 tablet; Refill: 1  Erectile dysfunction due to diseases classified elsewhere -     Tadalafil ; Take 1 tablet (20 mg total) by mouth daily as needed for erectile dysfunction. 30 mins to 4 hr before intercourse  Dispense: 10 tablet; Refill: 2  Microalbuminuria due to type 2 diabetes mellitus (HCC) -     Empagliflozin ; Take 1 tablet (10 mg total) by mouth daily before breakfast.  Dispense: 90 tablet; Refill: 0    Problem List Items Addressed This Visit       Endocrine   Controlled type 2 diabetes mellitus without complication, without long-term current use of insulin (HCC) - Primary   Relevant Medications   empagliflozin  (JARDIANCE ) 10 MG TABS tablet   Other Relevant Orders   POCT CBG (Fasting - Glucose) (Completed)     Other   Mixed hyperlipidemia   Relevant  Medications   ezetimibe  (ZETIA ) 10 MG tablet   tadalafil  (CIALIS ) 20 MG tablet   Other Visit Diagnoses       Acute kidney injury       Relevant Orders   Basic metabolic panel with GFR   POCT Urinalysis Dipstick (81002) (Completed)   POC CREATINE & ALBUMIN,URINE (Completed)     Erectile dysfunction due to diseases classified elsewhere       Relevant Medications   tadalafil  (CIALIS ) 20 MG tablet     Microalbuminuria due to type 2 diabetes mellitus (HCC)       Relevant Medications   empagliflozin  (JARDIANCE ) 10 MG TABS tablet       Return in about 4 weeks (around 07/02/2024) for fu with labs prior.   Total time spent: 20 minutes. This time includes review of previous notes and results and patient face to face interaction during today'Hollie Wojahn visit.    Sherrill Cinderella Perry, MD  06/04/2024   This document may have been prepared by [facility] Voice Recognition software and as such may include unintentional dictation errors.     [1]  Allergies Allergen Reactions   Penicillins Hives  [2]  Outpatient Medications Prior to Visit  Medication Sig   glucose blood (ONETOUCH ULTRA TEST) test strip Use with device to check sugars 2-3 times daily   Intimacy Products (EROXON) GEL Apply 1 Package topically as directed for 4 doses. Apply one tube to glans penis prior to intercourse   OneTouch UltraSoft 2 Lancets MISC 1 Units by Does not apply route 2 (two) times daily.   pantoprazole  (PROTONIX ) 40 MG tablet Take 1 tablet (40 mg total) by mouth every morning.   propranolol  (INDERAL ) 20 MG tablet Take 1 tablet (20 mg total) by mouth 3 (three) times daily.   rosuvastatin  (CRESTOR ) 40 MG tablet Take 1 tablet (40 mg total) by mouth at bedtime.   [DISCONTINUED] tadalafil  (CIALIS )  20 MG tablet Take 1 tablet (20 mg total) by mouth daily as needed for erectile dysfunction. 30 mins to 4 hr before intercourse   No facility-administered medications prior to visit.   "

## 2024-06-10 ENCOUNTER — Other Ambulatory Visit: Payer: Self-pay | Admitting: Internal Medicine

## 2024-06-10 DIAGNOSIS — R251 Tremor, unspecified: Secondary | ICD-10-CM

## 2024-07-02 ENCOUNTER — Ambulatory Visit: Admitting: Internal Medicine
